# Patient Record
Sex: Male | Born: 1960 | Race: White | Hispanic: No | Marital: Married | Smoking: Never smoker
Health system: Southern US, Community
[De-identification: ages and names within clinical notes are randomized; demographics above are authoritative.]

## PROBLEM LIST (undated history)

## (undated) DIAGNOSIS — M1990 Unspecified osteoarthritis, unspecified site: Secondary | ICD-10-CM

## (undated) DIAGNOSIS — I779 Disorder of arteries and arterioles, unspecified: Secondary | ICD-10-CM

## (undated) DIAGNOSIS — K219 Gastro-esophageal reflux disease without esophagitis: Secondary | ICD-10-CM

## (undated) DIAGNOSIS — I10 Essential (primary) hypertension: Secondary | ICD-10-CM

## (undated) DIAGNOSIS — I219 Acute myocardial infarction, unspecified: Secondary | ICD-10-CM

## (undated) DIAGNOSIS — A6920 Lyme disease, unspecified: Secondary | ICD-10-CM

## (undated) DIAGNOSIS — I2510 Atherosclerotic heart disease of native coronary artery without angina pectoris: Secondary | ICD-10-CM

## (undated) DIAGNOSIS — F329 Major depressive disorder, single episode, unspecified: Secondary | ICD-10-CM

## (undated) DIAGNOSIS — I200 Unstable angina: Secondary | ICD-10-CM

## (undated) DIAGNOSIS — Z7902 Long term (current) use of antithrombotics/antiplatelets: Secondary | ICD-10-CM

## (undated) DIAGNOSIS — I6523 Occlusion and stenosis of bilateral carotid arteries: Secondary | ICD-10-CM

## (undated) DIAGNOSIS — F32A Depression, unspecified: Secondary | ICD-10-CM

## (undated) DIAGNOSIS — E785 Hyperlipidemia, unspecified: Secondary | ICD-10-CM

## (undated) DIAGNOSIS — F419 Anxiety disorder, unspecified: Secondary | ICD-10-CM

## (undated) DIAGNOSIS — H9319 Tinnitus, unspecified ear: Secondary | ICD-10-CM

## (undated) HISTORY — PX: PORTACATH PLACEMENT: SHX2246

## (undated) HISTORY — PX: PORT-A-CATH REMOVAL: SHX5289

## (undated) HISTORY — PX: CORONARY ANGIOPLASTY: SHX604

## (undated) HISTORY — PX: COLONOSCOPY: SHX174

---

## 2015-09-02 DIAGNOSIS — I214 Non-ST elevation (NSTEMI) myocardial infarction: Secondary | ICD-10-CM

## 2015-09-02 HISTORY — DX: Non-ST elevation (NSTEMI) myocardial infarction: I21.4

## 2016-04-13 ENCOUNTER — Emergency Department: Payer: BLUE CROSS/BLUE SHIELD

## 2016-04-13 ENCOUNTER — Encounter: Payer: Self-pay | Admitting: Emergency Medicine

## 2016-04-13 ENCOUNTER — Inpatient Hospital Stay
Admission: EM | Admit: 2016-04-13 | Discharge: 2016-04-15 | DRG: 247 | Disposition: A | Discharge status: Home / Self Care | Payer: BLUE CROSS/BLUE SHIELD | Attending: Internal Medicine | Admitting: Internal Medicine

## 2016-04-13 DIAGNOSIS — I2510 Atherosclerotic heart disease of native coronary artery without angina pectoris: Secondary | ICD-10-CM | POA: Diagnosis present

## 2016-04-13 DIAGNOSIS — I10 Essential (primary) hypertension: Secondary | ICD-10-CM | POA: Diagnosis present

## 2016-04-13 DIAGNOSIS — Z79899 Other long term (current) drug therapy: Secondary | ICD-10-CM

## 2016-04-13 DIAGNOSIS — Z808 Family history of malignant neoplasm of other organs or systems: Secondary | ICD-10-CM

## 2016-04-13 DIAGNOSIS — E785 Hyperlipidemia, unspecified: Secondary | ICD-10-CM | POA: Diagnosis present

## 2016-04-13 DIAGNOSIS — F419 Anxiety disorder, unspecified: Secondary | ICD-10-CM | POA: Diagnosis present

## 2016-04-13 DIAGNOSIS — R079 Chest pain, unspecified: Secondary | ICD-10-CM

## 2016-04-13 DIAGNOSIS — I214 Non-ST elevation (NSTEMI) myocardial infarction: Secondary | ICD-10-CM | POA: Diagnosis present

## 2016-04-13 DIAGNOSIS — I219 Acute myocardial infarction, unspecified: Secondary | ICD-10-CM

## 2016-04-13 HISTORY — DX: Anxiety disorder, unspecified: F41.9

## 2016-04-13 HISTORY — DX: Acute myocardial infarction, unspecified: I21.9

## 2016-04-13 HISTORY — DX: Lyme disease, unspecified: A69.20

## 2016-04-13 HISTORY — DX: Essential (primary) hypertension: I10

## 2016-04-13 LAB — COMPREHENSIVE METABOLIC PANEL
ALBUMIN: 4.2 g/dL (ref 3.5–5.0)
ALK PHOS: 49 U/L (ref 38–126)
ALT: 16 U/L — ABNORMAL LOW (ref 17–63)
AST: 18 U/L (ref 15–41)
Anion gap: 3 — ABNORMAL LOW (ref 5–15)
BILIRUBIN TOTAL: 0.5 mg/dL (ref 0.3–1.2)
BUN: 14 mg/dL (ref 6–20)
CO2: 30 mmol/L (ref 22–32)
Calcium: 8.9 mg/dL (ref 8.9–10.3)
Chloride: 107 mmol/L (ref 101–111)
Creatinine, Ser: 1.05 mg/dL (ref 0.61–1.24)
GFR calc Af Amer: >60 mL/min (ref >60)
GFR calc non Af Amer: >60 mL/min (ref >60)
GLUCOSE: 136 mg/dL — AB (ref 65–99)
POTASSIUM: 3.5 mmol/L (ref 3.5–5.1)
Sodium: 140 mmol/L (ref 135–145)
TOTAL PROTEIN: 6.7 g/dL (ref 6.5–8.1)

## 2016-04-13 LAB — TROPONIN I
TROPONIN I: 0.1 ng/mL — AB (ref <0.03)
Troponin I: <0.03 ng/mL (ref <0.03)

## 2016-04-13 LAB — CBC WITH DIFFERENTIAL/PLATELET
BASOS ABS: 0.1 10*3/uL (ref 0–0.1)
BASOS PCT: 1 %
Eosinophils Absolute: 0.2 10*3/uL (ref 0–0.7)
Eosinophils Relative: 3 %
HEMATOCRIT: 39.4 % — AB (ref 40.0–52.0)
HEMOGLOBIN: 13.7 g/dL (ref 13.0–18.0)
Lymphocytes Relative: 28 %
Lymphs Abs: 2.3 10*3/uL (ref 1.0–3.6)
MCH: 30.7 pg (ref 26.0–34.0)
MCHC: 34.7 g/dL (ref 32.0–36.0)
MCV: 88.5 fL (ref 80.0–100.0)
Monocytes Absolute: 0.4 10*3/uL (ref 0.2–1.0)
Monocytes Relative: 5 %
NEUTROS ABS: 5.2 10*3/uL (ref 1.4–6.5)
NEUTROS PCT: 63 %
Platelets: 194 10*3/uL (ref 150–440)
RBC: 4.45 MIL/uL (ref 4.40–5.90)
RDW: 13.9 % (ref 11.5–14.5)
WBC: 8.2 10*3/uL (ref 3.8–10.6)

## 2016-04-13 LAB — LIPASE, BLOOD: LIPASE: 20 U/L (ref 11–51)

## 2016-04-13 LAB — FIBRIN DERIVATIVES D-DIMER (ARMC ONLY): Fibrin derivatives D-dimer (ARMC): 256 (ref 0–499)

## 2016-04-13 MED ORDER — FENTANYL CITRATE (PF) 100 MCG/2ML IJ SOLN: 100.0000 ug | INTRAMUSCULAR | Status: DC | PRN

## 2016-04-13 NOTE — ED Notes (Signed)
Pt requesting something to eat. Given [place] sandwich tray and sprite.

## 2016-04-13 NOTE — ED Notes (Signed)
Dr Ara Kussmaul at bedside seeing pt.

## 2016-04-13 NOTE — H&P (Addendum)
[place] @ [facility] Admission History and Physical Harvie Bridge, D.O.  ---------------------------------------------------------------------------------------------------------------------   PATIENT NAME: Carlos Diaz MR#: [id number] DATE OF BIRTH: 1961/08/19 DATE OF ADMISSION: 04/13/2016 PRIMARY CARE PHYSICIAN: Valera Castle, MD  REQUESTING/REFERRING PHYSICIAN: ED Dr. Quentin Cornwall  CHIEF COMPLAINT: Chief Complaint  Patient presents with  . Chest Pain    HISTORY OF PRESENT ILLNESS: Carlos Diaz is a 55 y.o. male with a known history of Anxiety, hypertension and Lyme disease was in a usual state of health until this afternoon when he experienced chest pain. Patient states that for the last day he has had generalized fatigue. He was out to dinner this evening, ate half of a hamburger, and was walking into his home when he began experiencing 10 out of 10 mid sternal chest pain radiating to the posterior aspect of the left arm. He reports the pain as a deep ache, associated with diaphoresis but no nausea, shortness of breath, abdominal pain or palpitations. He states that he has never had this type of chest pain before. He states that when he was diagnosed with Lyme disease he underwent extensive testing but has not had any workup in the last 10 years.  Of note, today is the two-year anniversary of his son's death although he states that there has not been any anxiety or emotional reaction related to it.  Otherwise there has been no change in status. Patient has been taking medication as prescribed and there has been no recent change in medication or diet.  There has been no recent illness, travel or sick contacts.    Patient denies fevers/chills, weakness, dizziness, shortness of breath, N/V/C/D, abdominal pain, dysuria/frequency, changes in mental status.   EMS/ED COURSE:     PAST MEDICAL HISTORY: Past Medical History:  Diagnosis Date  . Anxiety   .  Hypertension   . Lyme disease       PAST SURGICAL HISTORY: History reviewed. No pertinent surgical history.    SOCIAL HISTORY: Social History  Substance Use Topics  . Smoking status: Never Smoker  . Smokeless tobacco: Never Used  . 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   FAMILY HISTORY: Mother died of brain cancer. Father is in his 26s and healthy. He has multiple uncles and cousins with coronary artery disease.   MEDICATIONS AT HOME: Prior to Admission medications   Medication Sig Start Date End Date Taking? Authorizing Provider  PARoxetine (PAXIL) 30 MG tablet Take 30 mg by mouth daily.   Yes Historical Provider, MD  verapamil (CALAN-SR) 180 MG CR tablet Take 180 mg by mouth 2 (two) times daily.   Yes Historical Provider, MD      DRUG ALLERGIES: No Known Allergies   REVIEW OF SYSTEMS: CONSTITUTIONAL: No fever/chills, fatigue, weakness, weight gain/loss, headache EYES: No blurry or double vision. ENT: No tinnitus, postnasal drip, redness or soreness of the oropharynx. RESPIRATORY: No cough, wheeze, hemoptysis, dyspnea. CARDIOVASCULAR: Positive chest pain, negative for orthopnea, palpitations, syncope. GASTROINTESTINAL: No nausea, vomiting, constipation, diarrhea, abdominal pain, hematemesis, melena or hematochezia. GENITOURINARY: No dysuria or hematuria. ENDOCRINE: No polyuria or nocturia. No heat or cold intolerance. HEMATOLOGY: No anemia, bruising, bleeding. INTEGUMENTARY: No rashes, ulcers, lesions. MUSCULOSKELETAL: No arthritis, swelling, gout. NEUROLOGIC: No numbness, tingling, weakness or ataxia. No seizure-type activity. PSYCHIATRIC: No anxiety, depression, insomnia.  PHYSICAL EXAMINATION: VITAL SIGNS: Blood pressure 122/73, pulse 60, temperature 98.2 F (36.8 C), temperature source Oral, resp. rate 12, height 6' (1.829 m), weight 108.9  kg (240 lb), SpO2 95 %.  GENERAL: 55 y.o.-year-old white male patient,  well-developed, well-nourished lying in the bed in no acute distress.  Pleasant and cooperative.   HEENT: Head atraumatic, normocephalic. Pupils equal, round, reactive to light and accommodation. No scleral icterus. Extraocular muscles intact. Nares are patent. Oropharynx is clear. Mucus membranes moist. NECK: Supple, full range of motion. No JVD, no bruit heard. No thyroid enlargement, no tenderness, no cervical lymphadenopathy. CHEST: Normal breath sounds bilaterally. No wheezing, rales, rhonchi or crackles. No use of accessory muscles of respiration.  No reproducible chest wall tenderness.  CARDIOVASCULAR: S1, S2 normal. No murmurs, rubs, or gallops. Cap refill <2 seconds. ABDOMEN: Soft, nontender, nondistended. No rebound, guarding, rigidity. Normoactive bowel sounds present in all four quadrants. No organomegaly or mass. EXTREMITIES: Full range of motion. No pedal edema, cyanosis, or clubbing. NEUROLOGIC: Cranial nerves II through XII are grossly intact with no focal sensorimotor deficit. Muscle strength 5/5 in all extremities. Sensation intact. Gait not checked. PSYCHIATRIC: The patient is alert and oriented x 3. Normal affect, mood, thought content. SKIN: Warm, dry, and intact without obvious rash, lesion, or ulcer.  LABORATORY PANEL:  CBC  Recent Labs Lab 04/13/16 1809  WBC 8.2  HGB 13.7  HCT 39.4*  PLT 194   ----------------------------------------------------------------------------------------------------------------- Chemistries  Recent Labs Lab 04/13/16 1809  NA 140  K 3.5  CL 107  CO2 30  GLUCOSE 136*  BUN 14  CREATININE 1.05  CALCIUM 8.9  AST 18  ALT 16*  ALKPHOS 38  BILITOT 0.5   ------------------------------------------------------------------------------------------------------------------ Cardiac Enzymes  Recent Labs Lab 04/13/16 2105  TROPONINI 0.10*    ------------------------------------------------------------------------------------------------------------------  RADIOLOGY: Dg Chest 2 View  Result Date: 04/13/2016 CLINICAL DATA:  Pt arrived by EMS from home with c/o anterior chest pain that was sharp and sudden moving from the left side to the right, also c/o pain in left upper arm, h/o htn, no other known heart or lung conditions; non smoker. EXAM: CHEST  2 VIEW COMPARISON:  None. FINDINGS: The heart size and mediastinal contours are within normal limits. Both lungs are clear. No pleural effusion. Mild lower thoracic spondylosis. IMPRESSION: 1.  No active cardiopulmonary disease is radiographically apparent. Electronically Signed   By: Van Clines M.D.   On: 04/13/2016 18:23    EKG:  Sinus rhythm at 65 bpm with no significant ST or T-wave changes.  IMPRESSION AND PLAN:  This is a 55 y.o. male with a history of hypertension, anxiety, Lyme disease now being admitted with: 1. Chest pain, positive troponin consistent with non-ST elevation MI- -Admit to telemetry for inpatient workup. Patient will receive aspirin, Plavix, beta blocker, statin and Lovenox full dose. We will trend troponins, check lipids, hemoglobin A1c and TSH. We'll request cardiology consultation for the morning, echocardiogram and carotid Dopplers. 2. Hypertension-continue verapamil 3. Anxiety continue Paxil   All the records are reviewed and case discussed with ED provider. Management plans discussed with the patient and who express understanding and agree with plan of care.  CODE STATUS: Full TOTAL TIME TAKING CARE OF THIS PATIENT: 60 minutes.   Carlos Diaz D.O. on 04/13/2016 at 10:51 PM Between 7am to 6pm - Pager - [phone] After 6pm go to [email or web address] - [employer] [facility] [place] [facility] Office [phone] CC: Primary care physician; Valera Castle, MD     Note: This dictation was prepared with Dragon  dictation along with smaller phrase technology. Any transcriptional errors that result from this process are unintentional.

## 2016-04-13 NOTE — ED Triage Notes (Signed)
Pt arrived by EMS from home with c/o chest pain that was sharp and sudden moving from the left side to the right. EMS gave 324 aspirin in route and 1 nitro spray. Pt states he is currently not having any pain.

## 2016-04-13 NOTE — ED Provider Notes (Signed)
[facility] Emergency Department Provider Note    First MD Initiated Contact with Patient 04/13/16 1801     (approximate)  I have reviewed the triage vital signs and the nursing notes.   HISTORY  Chief Complaint Chest Pain    HPI Carlos Diaz is a 56 y.o. male with no previous cardiac history presents with one day of generalized malaise followed by midsternal chest pain radiating to his left shoulder that started roughly 1 hour prior to arrival. Patient states the pain was 9 out of 10 in severity and a burning stabbing pain and sensation. Patient denies any nausea or vomiting. No diaphoresis. Patient did not syncopized. Does not have any pain with deep inspiration. No recent fevers and no cough. He has secondary degree relatives with heart disease but no first-degree or personal history of coronary disease.   Past Medical History:  Diagnosis Date  . Anxiety   . Hypertension   . Lyme disease     There are no active problems to display for this patient.   History reviewed. No pertinent surgical history.  Prior to Admission medications   Not on File    Allergies Review of patient's allergies indicates no known allergies.  History reviewed. No pertinent family history.  Social History Social History  Substance Use Topics  . Smoking status: Never Smoker  . Smokeless tobacco: Never Used  . Alcohol use No    Review of Systems Patient denies headaches, rhinorrhea, blurry vision, numbness, shortness of breath, chest pain, edema, cough, abdominal pain, nausea, vomiting, diarrhea, dysuria, fevers, rashes or hallucinations unless otherwise stated above in HPI. ____________________________________________   PHYSICAL EXAM:  VITAL SIGNS: Vitals:   04/13/16 1900 04/13/16 1930  BP: 131/82 133/73  Pulse: 68 64  Resp: 14 12  Temp:     Constitutional: Alert and oriented. Well appearing and in no acute distress. Eyes: Conjunctivae are normal.  PERRL. EOMI. Head: Atraumatic. Nose: No congestion/rhinnorhea. Mouth/Throat: Mucous membranes are moist.  Oropharynx non-erythematous. Neck: No stridor. Painless ROM. No cervical spine tenderness to palpation Hematological/Lymphatic/Immunilogical: No cervical lymphadenopathy. Cardiovascular: Normal rate, regular rhythm. Grossly normal heart sounds.  Good peripheral circulation. Respiratory: Normal respiratory effort.  No retractions. Lungs CTAB. Gastrointestinal: Soft and nontender. No distention. No abdominal bruits. No CVA tenderness. Genitourinary:  Musculoskeletal: No lower extremity tenderness nor edema.  No joint effusions. Neurologic:  Normal speech and language. No gross focal neurologic deficits are appreciated. No gait instability. Skin:  Skin is warm, dry and intact. No rash noted. Psychiatric: Mood and affect are normal. Speech and behavior are normal.  ____________________________________________   LABS (all labs ordered are listed, but only abnormal results are displayed)  Results for orders placed or performed during the hospital encounter of 04/13/16 (from the past 24 hour(s))  CBC with Differential/Platelet     Status: Abnormal   Collection Time: 04/13/16  6:09 PM  Result Value Ref Range   WBC 8.2 3.8 - 10.6 K/uL   RBC 4.45 4.40 - 5.90 MIL/uL   Hemoglobin 13.7 13.0 - 18.0 g/dL   HCT 39.4 (L) 40.0 - 52.0 %   MCV 88.5 80.0 - 100.0 fL   MCH 30.7 26.0 - 34.0 pg   MCHC 34.7 32.0 - 36.0 g/dL   RDW 13.9 11.5 - 14.5 %   Platelets 194 150 - 440 K/uL   Neutrophils Relative % 63 %   Neutro Abs 5.2 1.4 - 6.5 K/uL   Lymphocytes Relative 28 %   Lymphs Abs  2.3 1.0 - 3.6 K/uL   Monocytes Relative 5 %   Monocytes Absolute 0.4 0.2 - 1.0 K/uL   Eosinophils Relative 3 %   Eosinophils Absolute 0.2 0 - 0.7 K/uL   Basophils Relative 1 %   Basophils Absolute 0.1 0 - 0.1 K/uL  Comprehensive metabolic panel     Status: Abnormal   Collection Time: 04/13/16  6:09 PM  Result Value  Ref Range   Sodium 140 135 - 145 mmol/L   Potassium 3.5 3.5 - 5.1 mmol/L   Chloride 107 101 - 111 mmol/L   CO2 30 22 - 32 mmol/L   Glucose, Bld 136 (H) 65 - 99 mg/dL   BUN 14 6 - 20 mg/dL   Creatinine, Ser 1.05 0.61 - 1.24 mg/dL   Calcium 8.9 8.9 - 10.3 mg/dL   Total Protein 6.7 6.5 - 8.1 g/dL   Albumin 4.2 3.5 - 5.0 g/dL   AST 18 15 - 41 U/L   ALT 16 (L) 17 - 63 U/L   Alkaline Phosphatase 49 38 - 126 U/L   Total Bilirubin 0.5 0.3 - 1.2 mg/dL   GFR calc non Af Amer >60 >60 mL/min   GFR calc Af Amer >60 >60 mL/min   Anion gap 3 (L) 5 - 15  Troponin I     Status: None   Collection Time: 04/13/16  6:09 PM  Result Value Ref Range   Troponin I <0.03 <0.03 ng/mL  Fibrin derivatives D-Dimer ([facility] only)     Status: None   Collection Time: 04/13/16  6:09 PM  Result Value Ref Range   Fibrin derivatives D-dimer ([facility]) 256 0 - 499  Lipase, blood     Status: None   Collection Time: 04/13/16  6:09 PM  Result Value Ref Range   Lipase 20 11 - 51 U/L   ____________________________________________  EKG My interpretation at Time: 18:04   Indication: chest pain  Rate: 65  Rhythm: nsr Axis: normal Other: poor r wave progression, no acute ST elevations or depressions ____________________________________________  RADIOLOGY  CXR  my read shows no evidence of acute cardiopulmonary process.  ____________________________________________   PROCEDURES  Procedure(s) performed: none    Critical Care performed: no ____________________________________________   INITIAL IMPRESSION / ASSESSMENT AND PLAN / ED COURSE  Pertinent labs & imaging results that were available during my care of the patient were reviewed by me and considered in my medical decision making (see chart for details).  DDX: Pneumonia, ACS, bronchitis, muscle skeletal pain, PE, dissection, esophageal spasm, pancreatitis  Carlos Diaz is a 55 y.o. who presents to the ED with chest pain that happened 1 hour prior to  arrival. Patient arrives afebrile and hemodynamically stable. He is currently chest pain-free. Presentation is not consistent with dissection as he has no persistent discomfort, tachycardia or pain radiating through to his back. He has equal pulses in bilateral upper extremities. His initial EKG is without evidence of acute ischemia. Patient is otherwise low risk heart score of 3 given only for history of high blood pressure. Patient is low-risk [place], will check a d-dimer due to concern for PE. Order chest x-ray to evaluate for pneumonia or bronchitis. His abdominal exam is soft and benign. Will check a lipase for any signs of pancreatitis.  Clinical Course  Comment By Time  Repeat troponin came back elevated at 0.1. Based on these findings Have recommended inpatient admission to patient  for further evaluation and management.  Spoke with Dr. Ara Kussmaul regarding the patient's  symptoms and presentation and she is currently agreed to admit patient for further management.  Merlyn Lot, MD 08/13 2200     ____________________________________________   FINAL CLINICAL IMPRESSION(S) / ED DIAGNOSES  Final diagnoses:  NSTEMI (non-ST elevated myocardial infarction) ([place])  Chest pain, unspecified chest pain type      NEW MEDICATIONS STARTED DURING THIS VISIT:  New Prescriptions   No medications on file     Note:  This document was prepared using Dragon voice recognition software and may include unintentional dictation errors.    Merlyn Lot, MD 04/13/16 2352

## 2016-04-14 ENCOUNTER — Inpatient Hospital Stay: Admit: 2016-04-14 | Payer: BLUE CROSS/BLUE SHIELD

## 2016-04-14 ENCOUNTER — Encounter
Admission: EM | Disposition: A | Discharge status: Home / Self Care | Payer: Self-pay | Source: Home / Self Care | Attending: Internal Medicine

## 2016-04-14 ENCOUNTER — Inpatient Hospital Stay
Admit: 2016-04-14 | Discharge: 2016-04-14 | Disposition: A | Discharge status: Home / Self Care | Payer: BLUE CROSS/BLUE SHIELD | Attending: Family Medicine | Admitting: Family Medicine

## 2016-04-14 DIAGNOSIS — I2510 Atherosclerotic heart disease of native coronary artery without angina pectoris: Secondary | ICD-10-CM

## 2016-04-14 DIAGNOSIS — I5189 Other ill-defined heart diseases: Secondary | ICD-10-CM

## 2016-04-14 HISTORY — PX: CARDIAC CATHETERIZATION: SHX172

## 2016-04-14 HISTORY — DX: Other ill-defined heart diseases: I51.89

## 2016-04-14 HISTORY — DX: Atherosclerotic heart disease of native coronary artery without angina pectoris: I25.10

## 2016-04-14 LAB — TROPONIN I
TROPONIN I: 0.13 ng/mL — AB (ref <0.03)
TROPONIN I: 0.7 ng/mL — AB (ref <0.03)
Troponin I: 0.35 ng/mL (ref <0.03)

## 2016-04-14 LAB — LIPID PANEL
CHOL/HDL RATIO: 5.5 ratio
Cholesterol: 192 mg/dL (ref 0–200)
HDL: 35 mg/dL — ABNORMAL LOW (ref >40)
LDL CALC: 126 mg/dL — AB (ref 0–99)
Triglycerides: 154 mg/dL — ABNORMAL HIGH (ref <150)
VLDL: 31 mg/dL (ref 0–40)

## 2016-04-14 LAB — CBC
HCT: 40.3 % (ref 40.0–52.0)
Hemoglobin: 14 g/dL (ref 13.0–18.0)
MCH: 30.3 pg (ref 26.0–34.0)
MCHC: 34.7 g/dL (ref 32.0–36.0)
MCV: 87.5 fL (ref 80.0–100.0)
PLATELETS: 200 10*3/uL (ref 150–440)
RBC: 4.61 MIL/uL (ref 4.40–5.90)
RDW: 14.2 % (ref 11.5–14.5)
WBC: 9.7 10*3/uL (ref 3.8–10.6)

## 2016-04-14 LAB — BASIC METABOLIC PANEL
Anion gap: 8 (ref 5–15)
BUN: 11 mg/dL (ref 6–20)
CO2: 24 mmol/L (ref 22–32)
Calcium: 8.7 mg/dL — ABNORMAL LOW (ref 8.9–10.3)
Chloride: 107 mmol/L (ref 101–111)
Creatinine, Ser: 0.82 mg/dL (ref 0.61–1.24)
GFR calc Af Amer: >60 mL/min (ref >60)
GLUCOSE: 98 mg/dL (ref 65–99)
POTASSIUM: 3.8 mmol/L (ref 3.5–5.1)
Sodium: 139 mmol/L (ref 135–145)

## 2016-04-14 LAB — TSH: TSH: 3.362 u[IU]/mL (ref 0.350–4.500)

## 2016-04-14 LAB — HEMOGLOBIN A1C: HEMOGLOBIN A1C: 5.2 % (ref 4.0–6.0)

## 2016-04-14 LAB — PROTIME-INR
INR: 1.06
PROTHROMBIN TIME: 13.8 s (ref 11.4–15.2)

## 2016-04-14 SURGERY — LEFT HEART CATH AND CORONARY ANGIOGRAPHY
Anesthesia: Moderate Sedation | Laterality: Right

## 2016-04-14 MED ORDER — TICAGRELOR 90 MG PO TABS
90.0000 mg | ORAL_TABLET | Freq: Two times a day (BID) | ORAL | Status: DC
  Administered 2016-04-14 – 2016-04-15 (×2): 90 mg via ORAL
  Filled 2016-04-14 (×2): qty 1

## 2016-04-14 MED ORDER — BIVALIRUDIN 250 MG IV SOLR
INTRAVENOUS | Status: AC
  Filled 2016-04-14: qty 250

## 2016-04-14 MED ORDER — VERAPAMIL HCL ER 180 MG PO TBCR
180.0000 mg | EXTENDED_RELEASE_TABLET | Freq: Two times a day (BID) | ORAL | Status: DC
  Administered 2016-04-14 – 2016-04-15 (×4): 180 mg via ORAL
  Filled 2016-04-14 (×4): qty 1

## 2016-04-14 MED ORDER — ONDANSETRON HCL 4 MG/2ML IJ SOLN: 4.0000 mg | Freq: Four times a day (QID) | INTRAMUSCULAR | Status: DC | PRN

## 2016-04-14 MED ORDER — ENOXAPARIN SODIUM 120 MG/0.8ML ~~LOC~~ SOLN
1.0000 mg/kg | Freq: Two times a day (BID) | SUBCUTANEOUS | Status: DC
  Administered 2016-04-14: 120 mg via SUBCUTANEOUS
  Administered 2016-04-15: 110 mg via SUBCUTANEOUS
  Filled 2016-04-14 (×4): qty 0.8

## 2016-04-14 MED ORDER — TICAGRELOR 90 MG PO TABS
ORAL_TABLET | ORAL | Status: DC | PRN
  Administered 2016-04-14: 180 mg via ORAL

## 2016-04-14 MED ORDER — PAROXETINE HCL 20 MG PO TABS
30.0000 mg | ORAL_TABLET | Freq: Every day | ORAL | Status: DC
  Administered 2016-04-14 – 2016-04-15 (×2): 30 mg via ORAL
  Filled 2016-04-14 (×2): qty 2

## 2016-04-14 MED ORDER — SODIUM CHLORIDE 0.9 % IV SOLN: 250.0000 mL | INTRAVENOUS | Status: DC | PRN

## 2016-04-14 MED ORDER — HEPARIN (PORCINE) IN NACL 2-0.9 UNIT/ML-% IJ SOLN
INTRAMUSCULAR | Status: AC
  Filled 2016-04-14: qty 500

## 2016-04-14 MED ORDER — NITROGLYCERIN 5 MG/ML IV SOLN
INTRAVENOUS | Status: AC
  Filled 2016-04-14: qty 10

## 2016-04-14 MED ORDER — ASPIRIN 81 MG PO CHEW
CHEWABLE_TABLET | ORAL | Status: DC | PRN
  Administered 2016-04-14: 243 mg via ORAL

## 2016-04-14 MED ORDER — FENTANYL CITRATE (PF) 100 MCG/2ML IJ SOLN
INTRAMUSCULAR | Status: DC | PRN
  Administered 2016-04-14: 25 ug via INTRAVENOUS
  Administered 2016-04-14: 50 ug via INTRAVENOUS

## 2016-04-14 MED ORDER — ACETAMINOPHEN 325 MG PO TABS: 650.0000 mg | ORAL_TABLET | ORAL | Status: DC | PRN

## 2016-04-14 MED ORDER — MIDAZOLAM HCL 2 MG/2ML IJ SOLN
INTRAMUSCULAR | Status: AC
  Filled 2016-04-14: qty 2

## 2016-04-14 MED ORDER — SODIUM CHLORIDE 0.9 % WEIGHT BASED INFUSION
3.0000 mL/kg/h | INTRAVENOUS | Status: DC
Start: 2016-04-15 — End: 2016-04-14
  Administered 2016-04-14: 3 mL/kg/h via INTRAVENOUS

## 2016-04-14 MED ORDER — ATORVASTATIN CALCIUM 20 MG PO TABS: 80.0000 mg | ORAL_TABLET | Freq: Every day | ORAL | Status: DC

## 2016-04-14 MED ORDER — SODIUM CHLORIDE 0.9 % WEIGHT BASED INFUSION: 1.0000 mL/kg/h | INTRAVENOUS | Status: DC

## 2016-04-14 MED ORDER — MIDAZOLAM HCL 2 MG/2ML IJ SOLN
INTRAMUSCULAR | Status: DC | PRN
  Administered 2016-04-14 (×2): 1 mg via INTRAVENOUS

## 2016-04-14 MED ORDER — ZOLPIDEM TARTRATE 5 MG PO TABS: 5.0000 mg | ORAL_TABLET | Freq: Every evening | ORAL | Status: DC | PRN

## 2016-04-14 MED ORDER — ALPRAZOLAM 0.25 MG PO TABS: 0.2500 mg | ORAL_TABLET | Freq: Two times a day (BID) | ORAL | Status: DC | PRN

## 2016-04-14 MED ORDER — SODIUM CHLORIDE 0.9% FLUSH
3.0000 mL | Freq: Two times a day (BID) | INTRAVENOUS | Status: DC
  Administered 2016-04-14: 3 mL via INTRAVENOUS

## 2016-04-14 MED ORDER — SODIUM CHLORIDE 0.9% FLUSH
3.0000 mL | Freq: Two times a day (BID) | INTRAVENOUS | Status: DC
  Administered 2016-04-14 – 2016-04-15 (×2): 3 mL via INTRAVENOUS

## 2016-04-14 MED ORDER — ASPIRIN 300 MG RE SUPP: 300.0000 mg | RECTAL | Status: AC

## 2016-04-14 MED ORDER — NITROGLYCERIN 0.4 MG SL SUBL: 0.4000 mg | SUBLINGUAL_TABLET | SUBLINGUAL | Status: DC | PRN

## 2016-04-14 MED ORDER — TICAGRELOR 90 MG PO TABS
ORAL_TABLET | ORAL | Status: AC
  Filled 2016-04-14: qty 2

## 2016-04-14 MED ORDER — ASPIRIN 81 MG PO CHEW
CHEWABLE_TABLET | ORAL | Status: AC
  Filled 2016-04-14: qty 4

## 2016-04-14 MED ORDER — SODIUM CHLORIDE 0.9% FLUSH: 3.0000 mL | INTRAVENOUS | Status: DC | PRN

## 2016-04-14 MED ORDER — METOPROLOL TARTRATE 5 MG/5ML IV SOLN: 5.0000 mg | Freq: Once | INTRAVENOUS | Status: DC

## 2016-04-14 MED ORDER — ASPIRIN 81 MG PO CHEW: 324.0000 mg | CHEWABLE_TABLET | ORAL | Status: AC

## 2016-04-14 MED ORDER — SODIUM CHLORIDE 0.9 % IV SOLN
INTRAVENOUS | Status: DC | PRN
  Administered 2016-04-14: 1.75 mg/kg/h via INTRAVENOUS

## 2016-04-14 MED ORDER — CLOPIDOGREL BISULFATE 75 MG PO TABS
300.0000 mg | ORAL_TABLET | Freq: Once | ORAL | Status: AC
  Administered 2016-04-14: 300 mg via ORAL
  Filled 2016-04-14: qty 4

## 2016-04-14 MED ORDER — ASPIRIN EC 81 MG PO TBEC
81.0000 mg | DELAYED_RELEASE_TABLET | Freq: Every day | ORAL | Status: DC
  Administered 2016-04-14: 81 mg via ORAL
  Filled 2016-04-14: qty 1

## 2016-04-14 MED ORDER — NITROGLYCERIN 1 MG/10 ML FOR IR/CATH LAB
INTRA_ARTERIAL | Status: DC | PRN
  Administered 2016-04-14 (×4): 200 ug via INTRACORONARY

## 2016-04-14 MED ORDER — FENTANYL CITRATE (PF) 100 MCG/2ML IJ SOLN
INTRAMUSCULAR | Status: AC
  Filled 2016-04-14: qty 2

## 2016-04-14 MED ORDER — IOPAMIDOL (ISOVUE-300) INJECTION 61%
INTRAVENOUS | Status: DC | PRN
Start: 2016-04-14 — End: 2016-04-14
  Administered 2016-04-14: 165 mL via INTRA_ARTERIAL
  Administered 2016-04-14: 235 mL via INTRA_ARTERIAL

## 2016-04-14 MED ORDER — ATORVASTATIN CALCIUM 20 MG PO TABS
80.0000 mg | ORAL_TABLET | Freq: Once | ORAL | Status: AC
  Administered 2016-04-14: 80 mg via ORAL
  Filled 2016-04-14: qty 4

## 2016-04-14 MED ORDER — ASPIRIN 81 MG PO CHEW
81.0000 mg | CHEWABLE_TABLET | Freq: Every day | ORAL | Status: DC
  Administered 2016-04-15: 81 mg via ORAL
  Filled 2016-04-14: qty 1

## 2016-04-14 MED ORDER — METOPROLOL TARTRATE 25 MG PO TABS
12.5000 mg | ORAL_TABLET | Freq: Two times a day (BID) | ORAL | Status: DC
  Administered 2016-04-14 (×2): 12.5 mg via ORAL
  Administered 2016-04-14: 25 mg via ORAL
  Administered 2016-04-15: 12.5 mg via ORAL
  Filled 2016-04-14 (×4): qty 1

## 2016-04-14 MED ORDER — BIVALIRUDIN BOLUS VIA INFUSION - CUPID
INTRAVENOUS | Status: DC | PRN
  Administered 2016-04-14: 81.6 mg via INTRAVENOUS

## 2016-04-14 MED ORDER — SODIUM CHLORIDE 0.9 % IV SOLN
0.2500 mg/kg/h | INTRAVENOUS | Status: AC
  Filled 2016-04-14: qty 250

## 2016-04-14 MED ORDER — SODIUM CHLORIDE 0.9 % WEIGHT BASED INFUSION: 3.0000 mL/kg/h | INTRAVENOUS | Status: AC

## 2016-04-14 MED ORDER — ASPIRIN 81 MG PO CHEW: 81.0000 mg | CHEWABLE_TABLET | ORAL | Status: DC

## 2016-04-14 SURGICAL SUPPLY — 16 items
BALLN TREK RX 2.25X15 (BALLOONS) ×3
BALLOON TREK RX 2.25X15 (BALLOONS) ×2 IMPLANT
CATH INFINITI 5FR ANG PIGTAIL (CATHETERS) ×3 IMPLANT
CATH INFINITI 5FR JL4 (CATHETERS) ×3 IMPLANT
CATH INFINITI JR4 5F (CATHETERS) ×3 IMPLANT
CATH VISTA GUIDE 6FR XB3.5 SH (CATHETERS) ×3 IMPLANT
DEVICE CLOSURE MYNXGRIP 6/7F (Vascular Products) ×3 IMPLANT
DEVICE INFLAT 30 PLUS (MISCELLANEOUS) ×3 IMPLANT
KIT MANI 3VAL PERCEP (MISCELLANEOUS) ×3 IMPLANT
NEEDLE PERC 18GX7CM (NEEDLE) ×3 IMPLANT
PACK CARDIAC CATH (CUSTOM PROCEDURE TRAY) ×3 IMPLANT
SHEATH AVANTI 6FR X 11CM (SHEATH) ×3 IMPLANT
SHEATH PINNACLE 5F 10CM (SHEATH) ×3 IMPLANT
STENT XIENCE ALPINE RX 2.25X15 (Permanent Stent) ×3 IMPLANT
WIRE EMERALD 3MM-J .035X150CM (WIRE) ×3 IMPLANT
WIRE G HI TQ BMW 190 (WIRE) ×3 IMPLANT

## 2016-04-14 NOTE — H&P (Signed)
Carlos Diaz is a 55 y.o. male  [id number]  Primary Cardiologist: Neoma Laming Reason for Consultation: Non-STEMI  HPI: This is a 55 year old white male with a past medical history of anxiety disorder hypertension Lyme disease presented to the hospital with chest pain yesterday. Chest pain was described as pressure type associated with shortness of breath and diaphoresis. He had recurrent chest pain this morning also.   Review of Systems: No orthopnea PND or leg swelling    Past Medical History:  Diagnosis Date  . Anxiety   . Hypertension   . Lyme disease     Medications Prior to Admission  Medication Sig Dispense Refill  . PARoxetine (PAXIL) 30 MG tablet Take 30 mg by mouth daily.    . verapamil (CALAN-SR) 180 MG CR tablet Take 180 mg by mouth 2 (two) times daily.       Marland Kitchen aspirin  324 mg Oral NOW   Or  . aspirin  300 mg Rectal NOW  . aspirin EC  81 mg Oral Daily  . enoxaparin (LOVENOX) injection  1 mg/kg Subcutaneous Q12H  . metoprolol  5 mg Intravenous Once  . metoprolol tartrate  12.5 mg Oral BID  . PARoxetine  30 mg Oral Daily  . verapamil  180 mg Oral BID    Infusions:    No Known Allergies  Social History   Social History  . Marital status: Married    Spouse name: N/A  . Number of children: N/A  . Years of education: N/A   Occupational History  . Not on file.   Social History Main Topics  . Smoking status: Never Smoker  . Smokeless tobacco: Never Used  . Alcohol use No  . Drug use: No  . Sexual activity: Not on file   Other Topics Concern  . Not on file   Social History Narrative  . No narrative on file    History reviewed. No pertinent family history.  PHYSICAL EXAM: Vitals:   04/14/16 0015 04/14/16 0503  BP: 135/74 133/76  Pulse: 64 (!) 55  Resp: 16 16  Temp: 99 F (37.2 C) 98.1 F (36.7 C)     Intake/Output Summary (Last 24 hours) at 04/14/16 0837 Last data filed at 04/14/16 0503  Gross per 24 hour  Intake                 0 ml  Output              500 ml  Net             -500 ml    General:  Well appearing. No respiratory difficulty HEENT: normal Neck: supple. no JVD. Carotids 2+ bilat; no bruits. No lymphadenopathy or thryomegaly appreciated. Cor: PMI nondisplaced. Regular rate & rhythm. No rubs, gallops or murmurs. Lungs: clear Abdomen: soft, nontender, nondistended. No hepatosplenomegaly. No bruits or masses. Good bowel sounds. Extremities: no cyanosis, clubbing, rash, edema Neuro: alert & oriented x 3, cranial nerves grossly intact. moves all 4 extremities w/o difficulty. Affect pleasant.  ECG: Normal sinus rhythm nonspecific ST-T changes  Results for orders placed or performed during the hospital encounter of 04/13/16 (from the past 24 hour(s))  CBC with Differential/Platelet     Status: Abnormal   Collection Time: 04/13/16  6:09 PM  Result Value Ref Range   WBC 8.2 3.8 - 10.6 K/uL   RBC 4.45 4.40 - 5.90 MIL/uL   Hemoglobin 13.7 13.0 - 18.0 g/dL   HCT 39.4 (L)  40.0 - 52.0 %   MCV 88.5 80.0 - 100.0 fL   MCH 30.7 26.0 - 34.0 pg   MCHC 34.7 32.0 - 36.0 g/dL   RDW 13.9 11.5 - 14.5 %   Platelets 194 150 - 440 K/uL   Neutrophils Relative % 63 %   Neutro Abs 5.2 1.4 - 6.5 K/uL   Lymphocytes Relative 28 %   Lymphs Abs 2.3 1.0 - 3.6 K/uL   Monocytes Relative 5 %   Monocytes Absolute 0.4 0.2 - 1.0 K/uL   Eosinophils Relative 3 %   Eosinophils Absolute 0.2 0 - 0.7 K/uL   Basophils Relative 1 %   Basophils Absolute 0.1 0 - 0.1 K/uL  Comprehensive metabolic panel     Status: Abnormal   Collection Time: 04/13/16  6:09 PM  Result Value Ref Range   Sodium 140 135 - 145 mmol/L   Potassium 3.5 3.5 - 5.1 mmol/L   Chloride 107 101 - 111 mmol/L   CO2 30 22 - 32 mmol/L   Glucose, Bld 136 (H) 65 - 99 mg/dL   BUN 14 6 - 20 mg/dL   Creatinine, Ser 1.05 0.61 - 1.24 mg/dL   Calcium 8.9 8.9 - 10.3 mg/dL   Total Protein 6.7 6.5 - 8.1 g/dL   Albumin 4.2 3.5 - 5.0 g/dL   AST 18 15 - 41 U/L   ALT 16 (L) 17  - 63 U/L   Alkaline Phosphatase 49 38 - 126 U/L   Total Bilirubin 0.5 0.3 - 1.2 mg/dL   GFR calc non Af Amer >60 >60 mL/min   GFR calc Af Amer >60 >60 mL/min   Anion gap 3 (L) 5 - 15  Troponin I     Status: None   Collection Time: 04/13/16  6:09 PM  Result Value Ref Range   Troponin I <0.03 <0.03 ng/mL  Fibrin derivatives D-Dimer ([facility] only)     Status: None   Collection Time: 04/13/16  6:09 PM  Result Value Ref Range   Fibrin derivatives D-dimer [facility]) 256 0 - 499  Lipase, blood     Status: None   Collection Time: 04/13/16  6:09 PM  Result Value Ref Range   Lipase 20 11 - 51 U/L  Troponin I     Status: Abnormal   Collection Time: 04/13/16  9:05 PM  Result Value Ref Range   Troponin I 0.10 (HH) <0.03 ng/mL  TSH     Status: None   Collection Time: 04/14/16 12:31 AM  Result Value Ref Range   TSH 3.362 0.350 - 4.500 uIU/mL  Troponin I     Status: Abnormal   Collection Time: 04/14/16 12:31 AM  Result Value Ref Range   Troponin I 0.13 (HH) <0.03 ng/mL  Troponin I     Status: Abnormal   Collection Time: 04/14/16  6:23 AM  Result Value Ref Range   Troponin I 0.35 (HH) <0.03 ng/mL  Basic metabolic panel     Status: Abnormal   Collection Time: 04/14/16  6:23 AM  Result Value Ref Range   Sodium 139 135 - 145 mmol/L   Potassium 3.8 3.5 - 5.1 mmol/L   Chloride 107 101 - 111 mmol/L   CO2 24 22 - 32 mmol/L   Glucose, Bld 98 65 - 99 mg/dL   BUN 11 6 - 20 mg/dL   Creatinine, Ser 0.82 0.61 - 1.24 mg/dL   Calcium 8.7 (L) 8.9 - 10.3 mg/dL   GFR calc non Af Amer >60 >  60 mL/min   GFR calc Af Amer >60 >60 mL/min   Anion gap 8 5 - 15  Lipid panel     Status: Abnormal   Collection Time: 04/14/16  6:23 AM  Result Value Ref Range   Cholesterol 192 0 - 200 mg/dL   Triglycerides 154 (H) <150 mg/dL   HDL 35 (L) >40 mg/dL   Total CHOL/HDL Ratio 5.5 RATIO   VLDL 31 0 - 40 mg/dL   LDL Cholesterol 126 (H) 0 - 99 mg/dL  CBC     Status: None   Collection Time: 04/14/16  6:23 AM  Result Value  Ref Range   WBC 9.7 3.8 - 10.6 K/uL   RBC 4.61 4.40 - 5.90 MIL/uL   Hemoglobin 14.0 13.0 - 18.0 g/dL   HCT 40.3 40.0 - 52.0 %   MCV 87.5 80.0 - 100.0 fL   MCH 30.3 26.0 - 34.0 pg   MCHC 34.7 32.0 - 36.0 g/dL   RDW 14.2 11.5 - 14.5 %   Platelets 200 150 - 440 K/uL  Protime-INR     Status: None   Collection Time: 04/14/16  6:23 AM  Result Value Ref Range   Prothrombin Time 13.8 11.4 - 15.2 seconds   INR 1.06    Dg Chest 2 View  Result Date: 04/13/2016 CLINICAL DATA:  Pt arrived by EMS from home with c/o anterior chest pain that was sharp and sudden moving from the left side to the right, also c/o pain in left upper arm, h/o htn, no other known heart or lung conditions; non smoker. EXAM: CHEST  2 VIEW COMPARISON:  None. FINDINGS: The heart size and mediastinal contours are within normal limits. Both lungs are clear. No pleural effusion. Mild lower thoracic spondylosis. IMPRESSION: 1.  No active cardiopulmonary disease is radiographically apparent. Electronically Signed   By: Van Clines M.D.   On: 04/13/2016 18:23     ASSESSMENT AND PLAN:Non-STEMI with ongoing chest pain, plan doing cardiac catheterization today. Continue current medicines.  Jair Lindblad A

## 2016-04-14 NOTE — Plan of Care (Signed)
Problem: Consults Goal: Tobacco Cessation referral if indicated Outcome: Not Applicable Date Met: 39/76/73 Pt. denied smoking  Problem: Phase I Progression Outcomes Goal: Anginal pain relieved Outcome: Progressing Pt. denied any chest pain at this time

## 2016-04-14 NOTE — Progress Notes (Signed)
Patient is admitted to room 245 with the diagnosis of NSTEMI. Was transferred from the ER via stretcher and was accompanied by  Fritzi Mandes.  Alert and oriented x 4. Denied any acute  Pain at this time. Tele box called to CCMD with Estill Bamberg NT a second verifier.  Skin assessment done with Gelene Mink. RN, no skin issues of concern noted. No SOB or any acute distress noted. Fall Neurosurgeon. Accepted his medications without any complaint. Will continue to monitor.

## 2016-04-14 NOTE — Progress Notes (Signed)
ANTICOAGULATION CONSULT NOTE - Initial Consult  Pharmacy Consult for Lovenox dosing Indication: chest pain/ACS/STEMI  No Known Allergies  Patient Measurements: Height: 6' (182.9 cm) Weight: 240 lb (108.9 kg) IBW/kg (Calculated) : 77.6 Heparin Dosing Weight: 109 kg  Vital Signs: Temp: 98.2 F (36.8 C) (08/13 1808) Temp Source: Oral (08/13 1808) BP: 125/73 (08/13 2330) Pulse Rate: 60 (08/13 2330)  Labs:  Recent Labs  04/13/16 1809 04/13/16 2105  HGB 13.7  --   HCT 39.4*  --   PLT 194  --   CREATININE 1.05  --   TROPONINI <0.03 0.10*    Estimated Creatinine Clearance: 98.9 mL/min (by C-G formula based on SCr of 1.05 mg/dL).   Medical History: Past Medical History:  Diagnosis Date  . Anxiety   . Hypertension   . Lyme disease     Medications:    Assessment:  Goal of Therapy:   Monitor platelets by anticoagulation protocol: Yes   Plan:  Lovenox 1 mg/kg q 12 hours ordered. F/u labs per protocol.  Emran Molzahn S 04/14/2016,12:14 AM

## 2016-04-14 NOTE — Progress Notes (Signed)
[facility] [place] NAME: Gianny Rupe    MR#:  [id number]  DATE OF BIRTH:  03/03/1961  SUBJECTIVE:   Came in after having chest discomfort and found to have elevated troponin REVIEW OF SYSTEMS:   Review of Systems  Constitutional: Negative for chills, fever and weight loss.  HENT: Negative for ear discharge, ear pain and nosebleeds.   Eyes: Negative for blurred vision, pain and discharge.  Respiratory: Negative for sputum production, shortness of breath, wheezing and stridor.   Cardiovascular: Positive for chest pain. Negative for palpitations, orthopnea and PND.  Gastrointestinal: Negative for abdominal pain, diarrhea, nausea and vomiting.  Genitourinary: Negative for frequency and urgency.  Musculoskeletal: Negative for back pain and joint pain.  Neurological: Negative for sensory change, speech change, focal weakness and weakness.  Psychiatric/Behavioral: Negative for depression and hallucinations. The patient is not nervous/anxious.   All other systems reviewed and are negative.  Tolerating Diet: Tolerating PT:   DRUG ALLERGIES:  No Known Allergies  VITALS:  Blood pressure 132/86, pulse 60, temperature 98.3 F (36.8 C), resp. rate 20, height 6' (1.829 m), weight 239 lb 14.4 oz (108.8 kg), SpO2 98 %.  PHYSICAL EXAMINATION:   Physical Exam  GENERAL:  55 y.o.-year-old patient lying in the bed with no acute distress.  EYES: Pupils equal, round, reactive to light and accommodation. No scleral icterus. Extraocular muscles intact.  HEENT: Head atraumatic, normocephalic. Oropharynx and nasopharynx clear.  NECK:  Supple, no jugular venous distention. No thyroid enlargement, no tenderness.  LUNGS: Normal breath sounds bilaterally, no wheezing, rales, rhonchi. No use of accessory muscles of respiration.  CARDIOVASCULAR: S1, S2 normal. No murmurs, rubs, or gallops.  ABDOMEN: Soft, nontender, nondistended. Bowel sounds present. No  organomegaly or mass.  EXTREMITIES: No cyanosis, clubbing or edema b/l.    NEUROLOGIC: Cranial nerves II through XII are intact. No focal Motor or sensory deficits b/l.   PSYCHIATRIC:  patient is alert and oriented x 3.  SKIN: No obvious rash, lesion, or ulcer.   LABORATORY PANEL:  CBC  Recent Labs Lab 04/14/16 0623  WBC 9.7  HGB 14.0  HCT 40.3  PLT 200    Chemistries   Recent Labs Lab 04/13/16 1809 04/14/16 0623  NA 140 139  K 3.5 3.8  CL 107 107  CO2 30 24  GLUCOSE 136* 98  BUN 14 11  CREATININE 1.05 0.82  CALCIUM 8.9 8.7*  AST 18  --   ALT 16*  --   ALKPHOS 49  --   BILITOT 0.5  --    Cardiac Enzymes  Recent Labs Lab 04/14/16 0623  TROPONINI 0.35*   RADIOLOGY:  Dg Chest 2 View  Result Date: 04/13/2016 CLINICAL DATA:  Pt arrived by EMS from home with c/o anterior chest pain that was sharp and sudden moving from the left side to the right, also c/o pain in left upper arm, h/o htn, no other known heart or lung conditions; non smoker. EXAM: CHEST  2 VIEW COMPARISON:  None. FINDINGS: The heart size and mediastinal contours are within normal limits. Both lungs are clear. No pleural effusion. Mild lower thoracic spondylosis. IMPRESSION: 1.  No active cardiopulmonary disease is radiographically apparent. Electronically Signed   By: Van Clines M.D.   On: 04/13/2016 18:23   ASSESSMENT AND PLAN:  55 y.o. male with a history of hypertension, anxiety, Lyme disease now being admitted with: 1. ACute non-ST elevation MI --cont  aspirin, Plavix, beta blocker,  statin and Lovenox full dose.  -mild increase in the trend of troponins -seen by dr Humphrey Rolls. Scheduled for heart cath  2. Hypertension-continue verapamil  3. Anxiety continue Paxil  4. HL, new -cont statins  Case discussed with Care Management/Social Worker. Management plans discussed with the patient, family and they are in agreement.  CODE STATUS: full  DVT Prophylaxis: lovenox  TOTAL TIME TAKING  CARE OF THIS PATIENT: 30 minutes.  >50% time spent on counselling and coordination of care pt and wfie  POSSIBLE D/C IN 1-2 DAYS, DEPENDING ON CLINICAL CONDITION.  Note: This dictation was prepared with Dragon dictation along with smaller phrase technology. Any transcriptional errors that result from this process are unintentional.  Kimiya Brunelle M.D on 04/14/2016 at 1:39 PM  Between 7am to 6pm - Pager - [phone]  After 6pm go to [email or web address] - password [email or web address]  Office  [phone]  CC: Primary care physician; Valera Castle, MD

## 2016-04-15 ENCOUNTER — Encounter: Payer: Self-pay | Admitting: Cardiovascular Disease

## 2016-04-15 LAB — ECHOCARDIOGRAM COMPLETE
Height: 72 in
Weight: 3838.4 oz

## 2016-04-15 MED ORDER — VERAPAMIL HCL ER 180 MG PO TBCR: 180.0000 mg | EXTENDED_RELEASE_TABLET | Freq: Every day | ORAL | 0 refills | Status: DC

## 2016-04-15 MED ORDER — TICAGRELOR 90 MG PO TABS: 90.0000 mg | ORAL_TABLET | Freq: Two times a day (BID) | ORAL | 2 refills | Status: DC

## 2016-04-15 MED ORDER — ATORVASTATIN CALCIUM 40 MG PO TABS: 40.0000 mg | ORAL_TABLET | Freq: Every day | ORAL | 1 refills | Status: DC

## 2016-04-15 MED ORDER — METOPROLOL TARTRATE 25 MG PO TABS: 12.5000 mg | ORAL_TABLET | Freq: Two times a day (BID) | ORAL | 0 refills | Status: DC

## 2016-04-15 MED ORDER — ASPIRIN 81 MG PO CHEW: 81.0000 mg | CHEWABLE_TABLET | Freq: Every day | ORAL | 1 refills | Status: DC

## 2016-04-15 NOTE — Progress Notes (Signed)
Patient is post day 1 of heart catheterization via the r groin. He remained hemodynamically stable overnight with VS WDL for patient. Cath site is free from redness pain or drainage with palpable pulses on all extremities.

## 2016-04-15 NOTE — Progress Notes (Signed)
Per dr. Humphrey Rolls, cardiac rehab will be set up outpatient at his office.

## 2016-04-15 NOTE — Progress Notes (Signed)
Patient being discharged to home with his wife.  Instructions given regarding medication, post cath care, and appointments.  Cath site is clean, dry, and surrounding tissue is soft.

## 2016-04-15 NOTE — Discharge Summary (Signed)
[facility] [place] NAME: Carlos Diaz    MR#:  [id number]  DATE OF BIRTH:  [birth year]  DATE OF ADMISSION:  04/13/2016 ADMITTING PHYSICIAN: Gladstone Lighter, MD  DATE OF DISCHARGE: 04/15/16  PRIMARY CARE PHYSICIAN: Valera Castle, MD    ADMISSION DIAGNOSIS:  NSTEMI (non-ST elevated myocardial infarction) ([place]) [I21.4] Chest pain, unspecified chest pain type [R07.9]  DISCHARGE DIAGNOSIS:  Acute NSTEMI  CAD s/p PCI and DES to RIM HL HTN  SECONDARY DIAGNOSIS:   Past Medical History:  Diagnosis Date  . Anxiety   . Hypertension   . Lyme disease     HOSPITAL COURSE:   55 y.o.malewith a history of hypertension, anxiety, Lyme diseasenow being admitted with: 1. ACute non-ST elevation MI --cont  aspirin, Plavix, beta blocker, statin and Lovenox full dose.  -mild increase in the trend of troponins -seen by dr Humphrey Rolls.  -s/p PCI and DES in 95% stenosed RIM -asa,brilinta,statins, BB   2. Hypertension-continue verapamil  3. Anxiety continue Paxil  4. HL, new -cont statins  Overall stable  D/c home  CONSULTS OBTAINED:  Treatment Team:  Dionisio Jovan, MD  DRUG ALLERGIES:  No Known Allergies  DISCHARGE MEDICATIONS:   Current Discharge Medication List    START taking these medications   Details  aspirin 81 MG chewable tablet Chew 1 tablet (81 mg total) by mouth daily. Qty: 30 tablet, Refills: 1    atorvastatin (LIPITOR) 40 MG tablet Take 1 tablet (40 mg total) by mouth daily. Qty: 30 tablet, Refills: 1    metoprolol tartrate (LOPRESSOR) 25 MG tablet Take 0.5 tablets (12.5 mg total) by mouth 2 (two) times daily. Qty: 60 tablet, Refills: 0    ticagrelor (BRILINTA) 90 MG TABS tablet Take 1 tablet (90 mg total) by mouth 2 (two) times daily. Qty: 60 tablet, Refills: 2      CONTINUE these medications which have CHANGED   Details  verapamil (CALAN-SR) 180 MG CR tablet Take 1 tablet (180 mg total) by  mouth at bedtime. Qty: 30 tablet, Refills: 0      CONTINUE these medications which have NOT CHANGED   Details  PARoxetine (PAXIL) 30 MG tablet Take 30 mg by mouth daily.        If you experience worsening of your admission symptoms, develop shortness of breath, life threatening emergency, suicidal or homicidal thoughts you must seek medical attention immediately by calling 911 or calling your MD immediately  if symptoms less severe.  You Must read complete instructions/literature along with all the possible adverse reactions/side effects for all the Medicines you take and that have been prescribed to you. Take any new Medicines after you have completely understood and accept all the possible adverse reactions/side effects.   Please note  You were cared for by a hospitalist during your hospital stay. If you have any questions about your discharge medications or the care you received while you were in the hospital after you are discharged, you can call the unit and asked to speak with the hospitalist on call if the hospitalist that took care of you is not available. Once you are discharged, your primary care physician will handle any further medical issues. Please note that NO REFILLS for any discharge medications will be authorized once you are discharged, as it is imperative that you return to your primary care physician (or establish a relationship with a primary care physician if you do not have one) for your aftercare  needs so that they can reassess your need for medications and monitor your lab values. Today   SUBJECTIVE    No complaints VITAL SIGNS:  Blood pressure 120/71, pulse (!) 55, temperature 98.6 F (37 C), temperature source Oral, resp. rate 16, height 6' (1.829 m), weight 239 lb 14.4 oz (108.8 kg), SpO2 99 %.  I/O:   Intake/Output Summary (Last 24 hours) at 04/15/16 0951 Last data filed at 04/15/16 0700  Gross per 24 hour  Intake           152.07 ml  Output              1200 ml  Net         -1047.93 ml    PHYSICAL EXAMINATION:  GENERAL:  55 y.o.-year-old patient lying in the bed with no acute distress.  EYES: Pupils equal, round, reactive to light and accommodation. No scleral icterus. Extraocular muscles intact.  HEENT: Head atraumatic, normocephalic. Oropharynx and nasopharynx clear.  NECK:  Supple, no jugular venous distention. No thyroid enlargement, no tenderness.  LUNGS: Normal breath sounds bilaterally, no wheezing, rales,rhonchi or crepitation. No use of accessory muscles of respiration.  CARDIOVASCULAR: S1, S2 normal. No murmurs, rubs, or gallops.  ABDOMEN: Soft, non-tender, non-distended. Bowel sounds present. No organomegaly or mass.  EXTREMITIES: No pedal edema, cyanosis, or clubbing.  NEUROLOGIC: Cranial nerves II through XII are intact. Muscle strength 5/5 in all extremities. Sensation intact. Gait not checked.  PSYCHIATRIC: The patient is alert and oriented x 3.  SKIN: No obvious rash, lesion, or ulcer.   DATA REVIEW:   CBC   Recent Labs Lab 04/14/16 0623  WBC 9.7  HGB 14.0  HCT 40.3  PLT 200    Chemistries   Recent Labs Lab 04/13/16 1809 04/14/16 0623  NA 140 139  K 3.5 3.8  CL 107 107  CO2 30 24  GLUCOSE 136* 98  BUN 14 11  CREATININE 1.05 0.82  CALCIUM 8.9 8.7*  AST 18  --   ALT 16*  --   ALKPHOS 49  --   BILITOT 0.5  --     Microbiology Results   No results found for this or any previous visit (from the past 240 hour(s)).  RADIOLOGY:  Dg Chest 2 View  Result Date: 04/13/2016 CLINICAL DATA:  Pt arrived by EMS from home with c/o anterior chest pain that was sharp and sudden moving from the left side to the right, also c/o pain in left upper arm, h/o htn, no other known heart or lung conditions; non smoker. EXAM: CHEST  2 VIEW COMPARISON:  None. FINDINGS: The heart size and mediastinal contours are within normal limits. Both lungs are clear. No pleural effusion. Mild lower thoracic spondylosis. IMPRESSION: 1.   No active cardiopulmonary disease is radiographically apparent. Electronically Signed   By: Van Clines M.D.   On: 04/13/2016 18:23     Management plans discussed with the patient, family and they are in agreement.  CODE STATUS:     Code Status Orders        Start     Ordered   04/14/16 1631  Full code  Continuous     04/14/16 1630    Code Status History    Date Active Date Inactive Code Status Order ID Comments User Context   04/14/2016  4:30 PM 04/14/2016  5:02 PM Full Code [id number]  Yolonda Kida, MD Inpatient   04/14/2016 12:09 AM 04/14/2016 12:37 PM Full Code [id number]  Ubaldo Glassing  Hugelmeyer, DO Inpatient    Advance Directive Documentation   Flowsheet Row Most Recent Value  Type of Advance Directive  Healthcare Power of Attorney  Pre-existing out of facility DNR order (yellow form or pink MOST form)  No data  "MOST" Form in Place?  No data      TOTAL TIME TAKING CARE OF THIS PATIENT: 40 minutes.    Aija Scarfo M.D on 04/15/2016 at 9:51 AM  Between 7am to 6pm - Pager - [phone] After 6pm go to [email or web address] - password [email or web address]  Office  [phone]  CC: Primary care physician; Valera Castle, MD

## 2016-04-15 NOTE — Progress Notes (Signed)
SUBJECTIVE: Patient denies any chest pain or shortness of breath   Vitals:   04/14/16 1930 04/14/16 2135 04/15/16 0609 04/15/16 0840  BP: 128/75 109/61 108/62 120/71  Pulse: (!) 52 (!) 54 (!) 58 (!) 55  Resp: 18  18 16   Temp: 99.2 F (37.3 C)  98.5 F (36.9 C) 98.6 F (37 C)  TempSrc: Oral  Oral Oral  SpO2: 100%  100% 99%  Weight:      Height:        Intake/Output Summary (Last 24 hours) at 04/15/16 0851 Last data filed at 04/15/16 0700  Gross per 24 hour  Intake           152.07 ml  Output             1200 ml  Net         -1047.93 ml    LABS: Basic Metabolic Panel:  Recent Labs  04/13/16 1809 04/14/16 0623  NA 140 139  K 3.5 3.8  CL 107 107  CO2 30 24  GLUCOSE 136* 98  BUN 14 11  CREATININE 1.05 0.82  CALCIUM 8.9 8.7*   Liver Function Tests:  Recent Labs  04/13/16 1809  AST 18  ALT 16*  ALKPHOS 49  BILITOT 0.5  PROT 6.7  ALBUMIN 4.2    Recent Labs  04/13/16 1809  LIPASE 20   CBC:  Recent Labs  04/13/16 1809 04/14/16 0623  WBC 8.2 9.7  NEUTROABS 5.2  --   HGB 13.7 14.0  HCT 39.4* 40.3  MCV 88.5 87.5  PLT 194 200   Cardiac Enzymes:  Recent Labs  04/14/16 0031 04/14/16 0623 04/14/16 1643  TROPONINI 0.13* 0.35* 0.70*   BNP: Invalid input(s): POCBNP D-Dimer: No results for input(s): DDIMER in the last 72 hours. Hemoglobin A1C:  Recent Labs  04/14/16 0031  HGBA1C 5.2   Fasting Lipid Panel:  Recent Labs  04/14/16 0623  CHOL 192  HDL 35*  LDLCALC 126*  TRIG 154*  CHOLHDL 5.5   Thyroid Function Tests:  Recent Labs  04/14/16 0031  TSH 3.362   Anemia Panel: No results for input(s): VITAMINB12, FOLATE, FERRITIN, TIBC, IRON, RETICCTPCT in the last 72 hours.   PHYSICAL EXAM General: Well developed, well nourished, in no acute distress HEENT:  Normocephalic and atramatic Neck:  No JVD.  Lungs: Clear bilaterally to auscultation and percussion. Heart: HRRR . Normal S1 and S2 without gallops or murmurs.  Abdomen:  Bowel sounds are positive, abdomen soft and non-tender  Msk:  Back normal, normal gait. Normal strength and tone for age. Extremities: No clubbing, cyanosis or edema.   Neuro: Alert and oriented X 3. Psych:  Good affect, responds appropriately  TELEMETRY:Sinus rhythm  ASSESSMENT AND PLAN: Non-STEMI with high-grade lesion about 95% mid ramus intermedius which had PCI with drug-eluting stent. There is moderate disease in first OM as well as left circumflex and mid LAD with normal left reticular systolic function. Patient is chest pain-free and can go home with follow-up in the office next Tuesday at 2 PM. Right groin looks okay. Patient can go home on current medicines.  Active Problems:   NSTEMI (non-ST elevated myocardial infarction) ([place])    Neoma Laming A, MD, [facility] 04/15/2016 8:51 AM

## 2016-04-24 ENCOUNTER — Emergency Department: Payer: BLUE CROSS/BLUE SHIELD

## 2016-04-24 ENCOUNTER — Encounter: Payer: Self-pay | Admitting: Emergency Medicine

## 2016-04-24 ENCOUNTER — Emergency Department
Admission: EM | Admit: 2016-04-24 | Discharge: 2016-04-24 | Disposition: A | Discharge status: Home / Self Care | Payer: BLUE CROSS/BLUE SHIELD | Attending: Emergency Medicine | Admitting: Emergency Medicine

## 2016-04-24 DIAGNOSIS — R0789 Other chest pain: Secondary | ICD-10-CM | POA: Diagnosis not present

## 2016-04-24 DIAGNOSIS — I252 Old myocardial infarction: Secondary | ICD-10-CM | POA: Insufficient documentation

## 2016-04-24 DIAGNOSIS — I10 Essential (primary) hypertension: Secondary | ICD-10-CM | POA: Diagnosis not present

## 2016-04-24 LAB — TROPONIN I: Troponin I: <0.03 ng/mL (ref <0.03)

## 2016-04-24 LAB — BASIC METABOLIC PANEL
ANION GAP: 6 (ref 5–15)
BUN: 15 mg/dL (ref 6–20)
CHLORIDE: 105 mmol/L (ref 101–111)
CO2: 28 mmol/L (ref 22–32)
CREATININE: 1.05 mg/dL (ref 0.61–1.24)
Calcium: 9.1 mg/dL (ref 8.9–10.3)
GFR calc non Af Amer: >60 mL/min (ref >60)
Glucose, Bld: 90 mg/dL (ref 65–99)
POTASSIUM: 3.6 mmol/L (ref 3.5–5.1)
SODIUM: 139 mmol/L (ref 135–145)

## 2016-04-24 LAB — CBC
HCT: 38.4 % — ABNORMAL LOW (ref 40.0–52.0)
Hemoglobin: 13.2 g/dL (ref 13.0–18.0)
MCH: 30.1 pg (ref 26.0–34.0)
MCHC: 34.4 g/dL (ref 32.0–36.0)
MCV: 87.7 fL (ref 80.0–100.0)
PLATELETS: 212 10*3/uL (ref 150–440)
RBC: 4.38 MIL/uL — ABNORMAL LOW (ref 4.40–5.90)
RDW: 13.5 % (ref 11.5–14.5)
WBC: 9.1 10*3/uL (ref 3.8–10.6)

## 2016-04-24 MED ORDER — PANTOPRAZOLE SODIUM 40 MG PO TBEC
DELAYED_RELEASE_TABLET | ORAL | Status: AC
  Administered 2016-04-24: 80 mg via ORAL
  Filled 2016-04-24: qty 2

## 2016-04-24 MED ORDER — ESOMEPRAZOLE MAGNESIUM 40 MG PO CPDR: 40.0000 mg | DELAYED_RELEASE_CAPSULE | Freq: Every day | ORAL | 1 refills | Status: DC

## 2016-04-24 MED ORDER — PANTOPRAZOLE SODIUM 40 MG PO TBEC
80.0000 mg | DELAYED_RELEASE_TABLET | Freq: Every day | ORAL | Status: DC
  Administered 2016-04-24: 80 mg via ORAL

## 2016-04-24 MED ORDER — ISOSORBIDE MONONITRATE ER 30 MG PO TB24: 30.0000 mg | ORAL_TABLET | Freq: Every day | ORAL | 0 refills | Status: DC

## 2016-04-24 MED ORDER — ISOSORBIDE MONONITRATE ER 30 MG PO TB24
30.0000 mg | ORAL_TABLET | Freq: Every day | ORAL | Status: DC
  Administered 2016-04-24: 30 mg via ORAL
  Filled 2016-04-24: qty 1

## 2016-04-24 NOTE — ED Triage Notes (Signed)
Pt from home with chest pain; had stent placed on 8/14 and has been following up with cardiologist. Had CT scan yesterday to confirm stent OK, and it was fine. Cardiologist told him to come here since he was still having left sided chest pain that radiated to back and left arm. NAD noted.

## 2016-04-24 NOTE — Discharge Instructions (Signed)
Take imdur and nexium as prescribed.   See Dr. Humphrey Rolls in office tomorrow at 10 am  Return to ER if you have worse chest pain, shortness of breath.

## 2016-04-24 NOTE — ED Provider Notes (Signed)
[facility] Provider Note   CSN: [id number] Arrival date & time: 04/24/16  1723     History   Chief Complaint Chief Complaint  Patient presents with  . Chest Pain    HPI Carlos Diaz is a 55 y.o. male hx of HTN, recent NSTEMI with stent 2 weeks ago, Here presenting with chest pain. States that since his stent he has been having some nausea and some chest tightness. It is definitely not getting worse or better. He states that he is not back to his baseline functional status. States that it is worse when he exerts himself. He has seen Dr. Humphrey Diaz since then and had coronary CT in the office yesterday that showed some diffuse LAD disease but stent is in place. He is sent here since he is not feeling better. Denies shortness of breath.   The history is provided by the patient.    Past Medical History:  Diagnosis Date  . Anxiety   . Hypertension   . Lyme disease     Patient Active Problem List   Diagnosis Date Noted  . NSTEMI (non-ST elevated myocardial infarction) ([place]) 04/13/2016    Past Surgical History:  Procedure Laterality Date  . CARDIAC CATHETERIZATION Right 04/14/2016   Procedure: Left Heart Cath and Coronary Angiography;  Surgeon: Carlos Daqwan, MD;  Location: [facility];  Service: Cardiovascular;  Laterality: Right;  . CARDIAC CATHETERIZATION N/A 04/14/2016   Procedure: Coronary Stent Intervention;  Surgeon: Carlos Kida, MD;  Location: [facility];  Service: Cardiovascular;  Laterality: N/A;       Home Medications    Prior to Admission medications   Medication Sig Start Date End Date Taking? Authorizing Provider  aspirin 81 MG chewable tablet Chew 1 tablet (81 mg total) by mouth daily. 04/15/16   Fritzi Mandes, MD  atorvastatin (LIPITOR) 40 MG tablet Take 1 tablet (40 mg total) by mouth daily. 04/15/16   Fritzi Mandes, MD  metoprolol tartrate (LOPRESSOR) 25 MG tablet Take 0.5 tablets (12.5 mg total) by mouth 2 (two) times daily.  04/15/16   Fritzi Mandes, MD  PARoxetine (PAXIL) 30 MG tablet Take 30 mg by mouth daily.    Historical Provider, MD  ticagrelor (BRILINTA) 90 MG TABS tablet Take 1 tablet (90 mg total) by mouth 2 (two) times daily. 04/15/16   Fritzi Mandes, MD  verapamil (CALAN-SR) 180 MG CR tablet Take 1 tablet (180 mg total) by mouth at bedtime. 04/15/16   Fritzi Mandes, MD    Family History History reviewed. No pertinent family history.  Social History Social History  Substance Use Topics  . Smoking status: Never Smoker  . Smokeless tobacco: Never Used  . Alcohol use No     Allergies   Review of patient's allergies indicates no known allergies.   Review of Systems Review of Systems  Cardiovascular: Positive for chest pain.  All other systems reviewed and are negative.    Physical Exam Updated Vital Signs BP 121/83   Pulse (!) 54   Temp 98.7 F (37.1 C) (Oral)   Resp 17   Ht 6' (1.829 m)   Wt 233 lb (105.7 kg)   SpO2 100%   BMI 31.60 kg/m   Physical Exam  Constitutional: He is oriented to person, place, and time. He appears well-developed and well-nourished.  Slightly anxious   HENT:  Head: Normocephalic.  Eyes: EOM are normal. Pupils are equal, round, and reactive to light.  Neck: Normal range of motion. Neck  supple.  Cardiovascular: Normal rate, regular rhythm and normal heart sounds.   Pulmonary/Chest: Effort normal and breath sounds normal. No respiratory distress. He has no wheezes. He has no rales.  Abdominal: Soft. Bowel sounds are normal. He exhibits no distension. There is no tenderness. There is no guarding.  Musculoskeletal: Normal range of motion.  Neurological: He is alert and oriented to person, place, and time.  Skin: Skin is warm.  Psychiatric: He has a normal mood and affect.  Nursing note and vitals reviewed.    ED Treatments / Results  Labs (all labs ordered are listed, but only abnormal results are displayed) Labs Reviewed  CBC - Abnormal; Notable for the  following:       Result Value   RBC 4.38 (*)    HCT 38.4 (*)    All other components within normal limits  BASIC METABOLIC PANEL  TROPONIN I    EKG  EKG Interpretation None      ED ECG REPORT I, Carlos Diaz, the attending physician, personally viewed and interpreted this ECG.   Date: 04/24/2016  EKG Time: 18:28 pm  Rate: 52  Rhythm: normal EKG, normal sinus rhythm  Axis: normal  Intervals:none  ST&T Change: nonspecific    Radiology Dg Chest 2 View  Result Date: 04/24/2016 CLINICAL DATA:  Chest pain. EXAM: CHEST  2 VIEW COMPARISON:  April 13, 2016 FINDINGS: The heart size and mediastinal contours are within normal limits. Both lungs are clear. The visualized skeletal structures are unremarkable. IMPRESSION: No active cardiopulmonary disease. Electronically Signed   By: Carlos Diaz III M.D   On: 04/24/2016 19:41    Procedures Procedures (including critical care time)  Medications Ordered in ED Medications  pantoprazole (PROTONIX) EC tablet 80 mg (80 mg Oral Given 04/24/16 2144)  isosorbide mononitrate (IMDUR) 24 hr tablet 30 mg (30 mg Oral Given 04/24/16 2144)     Initial Impression / Assessment and Plan / ED Course  I have reviewed the triage vital signs and the nursing notes.  Pertinent labs & imaging results that were available during my care of the patient were reviewed by me and considered in my medical decision making (see chart for details).  Clinical Course    Carlos Diaz is a 55 y.o. male here with chest pain. Persistent pain since the stent. Vitals stable. Well appearing. Pain is not getting worse. His EKG is unchanged and trop neg. I called Dr. Humphrey Diaz, who knows him well. He states that coronary CT showed that stent is in place and that he still has diffuse disease and likely has stable angina. He recommend imdur, nexium (in case its GI), follow up with him tomorrow at 10 am.    Final Clinical Impressions(s) / ED Diagnoses   Final diagnoses:  None      New Prescriptions New Prescriptions   No medications on file     Drenda Freeze, MD 04/24/16 2231

## 2016-05-20 MED ORDER — ACETAMINOPHEN 325 MG PO TABS
ORAL_TABLET | ORAL | Status: AC
  Filled 2016-05-20: qty 2

## 2016-05-20 MED ORDER — TETANUS-DIPHTH-ACELL PERTUSSIS 5-2.5-18.5 LF-MCG/0.5 IM SUSP
INTRAMUSCULAR | Status: AC
  Filled 2016-05-20: qty 0.5

## 2016-06-02 ENCOUNTER — Encounter: Payer: BLUE CROSS/BLUE SHIELD | Attending: Cardiovascular Disease | Admitting: *Deleted

## 2016-06-02 ENCOUNTER — Other Ambulatory Visit: Payer: Self-pay | Admitting: *Deleted

## 2016-06-02 VITALS — Ht 72.2 in | Wt 234.5 lb

## 2016-06-02 DIAGNOSIS — I10 Essential (primary) hypertension: Secondary | ICD-10-CM | POA: Insufficient documentation

## 2016-06-02 DIAGNOSIS — Z48812 Encounter for surgical aftercare following surgery on the circulatory system: Secondary | ICD-10-CM | POA: Insufficient documentation

## 2016-06-02 DIAGNOSIS — Z9889 Other specified postprocedural states: Secondary | ICD-10-CM | POA: Diagnosis not present

## 2016-06-02 DIAGNOSIS — F32A Depression, unspecified: Secondary | ICD-10-CM | POA: Insufficient documentation

## 2016-06-02 DIAGNOSIS — F419 Anxiety disorder, unspecified: Secondary | ICD-10-CM | POA: Insufficient documentation

## 2016-06-02 DIAGNOSIS — Z955 Presence of coronary angioplasty implant and graft: Secondary | ICD-10-CM | POA: Diagnosis present

## 2016-06-02 DIAGNOSIS — E785 Hyperlipidemia, unspecified: Secondary | ICD-10-CM | POA: Insufficient documentation

## 2016-06-02 DIAGNOSIS — A6920 Lyme disease, unspecified: Secondary | ICD-10-CM | POA: Insufficient documentation

## 2016-06-02 DIAGNOSIS — I214 Non-ST elevation (NSTEMI) myocardial infarction: Secondary | ICD-10-CM | POA: Diagnosis not present

## 2016-06-02 DIAGNOSIS — F329 Major depressive disorder, single episode, unspecified: Secondary | ICD-10-CM | POA: Insufficient documentation

## 2016-06-02 NOTE — Patient Instructions (Signed)
Patient Instructions  Patient Details  Name: Carlos Diaz MRN: [id number] Date of Birth: 01-01-1961 Referring Provider:  Dionisio Pheonix, MD  Below are the personal goals you chose as well as exercise and nutrition goals. Our goal is to help you keep on track towards obtaining and maintaining your goals. We will be discussing your progress on these goals with you throughout the program.  Initial Exercise Prescription:     Initial Exercise Prescription - 06/02/16 1500      Date of Initial Exercise RX and Referring Provider   Date 06/02/16   Referring Provider Neoma Laming MD     Treadmill   MPH 3.5   Grade 2   Minutes 15   METs 4.64     Elliptical   Level 2   Speed 4.6   Minutes 15     REL-XR   Level 4   Watts --  speed 50 rpm   Minutes 15   METs 4.5     Prescription Details   Frequency (times per week) 3   Duration Progress to 45 minutes of aerobic exercise without signs/symptoms of physical distress     Intensity   THRR 40-80% of Max Heartrate 106-144   Ratings of Perceived Exertion 11-13   Perceived Dyspnea 0-4     Progression   Progression Continue to progress workloads to maintain intensity without signs/symptoms of physical distress.     Resistance Training   Training Prescription Yes   Weight 5 lbs   Reps 10-12      Exercise Goals: Frequency: Be able to perform aerobic exercise three times per week working toward 3-5 days per week.  Intensity: Work with a perceived exertion of 11 (fairly light) - 15 (hard) as tolerated. Follow your new exercise prescription and watch for changes in prescription as you progress with the program. Changes will be reviewed with you when they are made.  Duration: You should be able to do 30 minutes of continuous aerobic exercise in addition to a 5 minute warm-up and a 5 minute cool-down routine.  Nutrition Goals: Your personal nutrition goals will be established when you do your nutrition analysis with the  dietician.  The following are nutrition guidelines to follow: Cholesterol < 200mg /day Sodium < 1500mg /day Fiber: Men over 50 yrs - 30 grams per day  Personal Goals:     Personal Goals and Risk Factors at Admission - 06/02/16 1431      Core Components/Risk Factors/Patient Goals on Admission    Weight Management Yes;Weight Maintenance   Intervention Weight Management: Develop a combined nutrition and exercise program designed to reach desired caloric intake, while maintaining appropriate intake of nutrient and fiber, sodium and fats, and appropriate energy expenditure required for the weight goal.;Weight Management: Provide education and appropriate resources to help participant work on and attain dietary goals.   Expected Outcomes Weight Maintenance: Understanding of the daily nutrition guidelines, which includes 25-35% calories from fat, 7% or less cal from saturated fats, less than 200mg  cholesterol, less than 1.5gm of sodium, & 5 or more servings of fruits and vegetables daily   Increase Strength and Stamina Yes   Intervention Provide advice, education, support and counseling about physical activity/exercise needs.   Expected Outcomes Achievement of increased cardiorespiratory fitness and enhanced flexibility, muscular endurance and strength shown through measurements of functional capacity and personal statement of participant.   Hypertension Yes   Intervention Provide education on lifestyle modifcations including regular physical activity/exercise, weight management, moderate sodium restriction and increased  consumption of fresh fruit, vegetables, and low fat dairy, alcohol moderation, and smoking cessation.;Monitor prescription use compliance.   Expected Outcomes Short Term: Continued assessment and intervention until BP is < 140/51mm HG in hypertensive participants. < 130/70mm HG in hypertensive participants with diabetes, heart failure or chronic kidney disease.;Long Term: Maintenance of  blood pressure at goal levels.   Lipids Yes   Intervention Provide education and support for participant on nutrition & aerobic/resistive exercise along with prescribed medications to achieve LDL 70mg , HDL >40mg .   Expected Outcomes Short Term: Participant states understanding of desired cholesterol values and is compliant with medications prescribed. Participant is following exercise prescription and nutrition guidelines.;Long Term: Cholesterol controlled with medications as prescribed, with individualized exercise RX and with personalized nutrition plan. Value goals: LDL < 70mg , HDL > 40 mg.   Stress Yes   Intervention Offer individual and/or small group education and counseling on adjustment to heart disease, stress management and health-related lifestyle change. Teach and support self-help strategies.;Refer participants experiencing significant psychosocial distress to appropriate mental health specialists for further evaluation and treatment. When possible, include family members and significant others in education/counseling sessions.   Expected Outcomes Short Term: Participant demonstrates changes in health-related behavior, relaxation and other stress management skills, ability to obtain effective social support, and compliance with psychotropic medications if prescribed.;Long Term: Emotional wellbeing is indicated by absence of clinically significant psychosocial distress or social isolation.      Tobacco Use Initial Evaluation: History  Smoking Status  . Never Smoker  Smokeless Tobacco  . Never Used    Copy of goals given to participant.

## 2016-06-02 NOTE — Progress Notes (Signed)
Cardiac Individual Treatment Plan  Patient Details  Name: Carlos Diaz MRN: [id number] Date of Birth: 1961-08-17 Referring Provider:   Flowsheet Row Cardiac Rehab from 06/02/2016 in [facility] Cardiac and Pulmonary Rehab  Referring Provider  Neoma Laming MD      Initial Encounter Date:  Flowsheet Row Cardiac Rehab from 06/02/2016 in [facility] Cardiac and Pulmonary Rehab  Date  06/02/16  Referring Provider  Neoma Laming MD      Visit Diagnosis: NSTEMI (non-ST elevated myocardial infarction) [facility])  Status post coronary artery stent placement  Patient's Home Medications on Admission:  Current Outpatient Prescriptions:  .  aspirin 81 MG chewable tablet, Chew 1 tablet (81 mg total) by mouth daily., Disp: 30 tablet, Rfl: 1 .  atorvastatin (LIPITOR) 40 MG tablet, Take 1 tablet (40 mg total) by mouth daily., Disp: 30 tablet, Rfl: 1 .  citalopram (CELEXA) 20 MG tablet, Take by mouth., Disp: , Rfl:  .  clonazePAM (KLONOPIN) 0.5 MG tablet, Take by mouth., Disp: , Rfl:  .  pantoprazole (PROTONIX) 40 MG tablet, Take by mouth., Disp: , Rfl:  .  ranolazine (RANEXA) 500 MG 12 hr tablet, Take by mouth., Disp: , Rfl:  .  sucralfate (CARAFATE) 1 g tablet, Take by mouth., Disp: , Rfl:  .  ticagrelor (BRILINTA) 90 MG TABS tablet, Take 1 tablet (90 mg total) by mouth 2 (two) times daily., Disp: 60 tablet, Rfl: 2  Past Medical History: Past Medical History:  Diagnosis Date  . Anxiety   . Hypertension   . Lyme disease     Tobacco Use: History  Smoking Status  . Never Smoker  Smokeless Tobacco  . Never Used    Labs: Recent Review Flowsheet Data    Labs for ITP Cardiac and Pulmonary Rehab Latest Ref Rng & Units 04/14/2016   Cholestrol 0 - 200 mg/dL 192   LDLCALC 0 - 99 mg/dL 126(H)   HDL >40 mg/dL 35(L)   Trlycerides <150 mg/dL 154(H)   Hemoglobin A1c 4.0 - 6.0 % 5.2       Exercise Target Goals: Date: 06/02/16  Exercise Program Goal: Individual exercise prescription set with THRR, safety &  activity barriers. Participant demonstrates ability to understand and report RPE using BORG scale, to self-measure pulse accurately, and to acknowledge the importance of the exercise prescription.  Exercise Prescription Goal: Starting with aerobic activity 30 plus minutes a day, 3 days per week for initial exercise prescription. Provide home exercise prescription and guidelines that participant acknowledges understanding prior to discharge.  Activity Barriers & Risk Stratification:     Activity Barriers & Cardiac Risk Stratification - 06/02/16 1430      Activity Barriers & Cardiac Risk Stratification   Activity Barriers Joint Problems  Both hands with joint pain with gripping and use of hands. History of Lyme Disease.  Previously dislocated shoulder   Cardiac Risk Stratification High      6 Minute Walk:     6 Minute Walk    Row Name 06/02/16 1513         6 Minute Walk   Phase Initial     Distance 1900 feet     Walk Time 6 minutes     # of Rest Breaks 0     MPH 3.6     METS 4.53     RPE 11     VO2 Peak 15.84     Symptoms No     Resting HR 68 bpm     Resting BP 122/70  Max Ex. HR 108 bpm     Max Ex. BP 130/76     2 Minute Post BP 126/70        Initial Exercise Prescription:     Initial Exercise Prescription - 06/02/16 1500      Date of Initial Exercise RX and Referring Provider   Date 06/02/16   Referring Provider Neoma Laming MD     Treadmill   MPH 3.5   Grade 2   Minutes 15   METs 4.64     Elliptical   Level 2   Speed 4.6   Minutes 15     REL-XR   Level 4   Watts --  speed 50 rpm   Minutes 15   METs 4.5     Prescription Details   Frequency (times per week) 3   Duration Progress to 45 minutes of aerobic exercise without signs/symptoms of physical distress     Intensity   THRR 40-80% of Max Heartrate 106-144   Ratings of Perceived Exertion 11-13   Perceived Dyspnea 0-4     Progression   Progression Continue to progress workloads to  maintain intensity without signs/symptoms of physical distress.     Resistance Training   Training Prescription Yes   Weight 5 lbs   Reps 10-12      Perform Capillary Blood Glucose checks as needed.  Exercise Prescription Changes:     Exercise Prescription Changes    Row Name 06/02/16 1500             Exercise Review   Progression -  walk test results         Response to Exercise   Blood Pressure (Admit) 122/70       Blood Pressure (Exercise) 130/76       Blood Pressure (Exit) 126/70       Heart Rate (Admit) 68 bpm       Heart Rate (Exercise) 108 bpm       Heart Rate (Exit) 71 bpm       Oxygen Saturation (Admit) 96 %       Oxygen Saturation (Exit) 100 %       Rating of Perceived Exertion (Exercise) 11       Symptoms none          Exercise Comments:     Exercise Comments    Row Name 06/02/16 1520           Exercise Comments Exercise goal is to be able to return to his previous exercise routine with confidence (swimming and lifting)          Discharge Exercise Prescription (Final Exercise Prescription Changes):     Exercise Prescription Changes - 06/02/16 1500      Exercise Review   Progression --  walk test results     Response to Exercise   Blood Pressure (Admit) 122/70   Blood Pressure (Exercise) 130/76   Blood Pressure (Exit) 126/70   Heart Rate (Admit) 68 bpm   Heart Rate (Exercise) 108 bpm   Heart Rate (Exit) 71 bpm   Oxygen Saturation (Admit) 96 %   Oxygen Saturation (Exit) 100 %   Rating of Perceived Exertion (Exercise) 11   Symptoms none      Nutrition:  Target Goals: Understanding of nutrition guidelines, daily intake of sodium 1500mg , cholesterol 200mg , calories 30% from fat and 7% or less from saturated fats, daily to have 5 or more servings of fruits and vegetables.  Biometrics:  Pre Biometrics - 06/02/16 1522      Pre Biometrics   Height 6' 0.2" (1.834 m)   Weight 234 lb 8 oz (106.4 kg)   Waist Circumference 41  inches   Hip Circumference 41 inches   Waist to Hip Ratio 1 %   BMI (Calculated) 31.7   Single Leg Stand 30 seconds       Nutrition Therapy Plan and Nutrition Goals:     Nutrition Therapy & Goals - 06/02/16 1418      Intervention Plan   Intervention Prescribe, educate and counsel regarding individualized specific dietary modifications aiming towards targeted core components such as weight, hypertension, lipid management, diabetes, heart failure and other comorbidities.   Expected Outcomes Short Term Goal: Understand basic principles of dietary content, such as calories, fat, sodium, cholesterol and nutrients.;Short Term Goal: A plan has been developed with personal nutrition goals set during dietitian appointment.;Long Term Goal: Adherence to prescribed nutrition plan.      Nutrition Discharge: Rate Your Plate Scores:     Nutrition Assessments - 06/02/16 1418      Rate Your Plate Scores   Pre Score 67   Pre Score % 80 %      Nutrition Goals Re-Evaluation:   Psychosocial: Target Goals: Acknowledge presence or absence of depression, maximize coping skills, provide positive support system. Participant is able to verbalize types and ability to use techniques and skills needed for reducing stress and depression.  Initial Review & Psychosocial Screening:     Initial Psych Review & Screening - 06/02/16 1424      Initial Review   Current issues with Current Depression;Current Anxiety/Panic;Current Psychotropic Meds;Current Stress Concerns   Source of Stress Concerns Chronic Illness;Financial  Stress caused by chronic illness that went years before diagnosed.  treatment expensive and traveled all over the [facility] to find a physician that could diagnose the Lyme Diseaset hat was causing all of his symptoms.    Comments Used savings up that was planned for early retirement.  Wife has same diagnosis of Lyme Disease and again spemt money for treatment and MD visits.     Family  Dynamics   Good Support System? Yes   Concerns Recent loss of child   Comments Son killed last year,hit by vehicle. This weighs daily on [place]     Barriers   Psychosocial barriers to participate in program There are no identifiable barriers or psychosocial needs.     Screening Interventions   Interventions Encouraged to exercise      Quality of Life Scores:     Quality of Life - 06/02/16 1424      Quality of Life Scores   Health/Function Pre 20.87 %   Socioeconomic Pre 20.5 %   Psych/Spiritual Pre 20.71 %   Family Pre 21 %   GLOBAL Pre 20.77 %      PHQ-9: Recent Review Flowsheet Data    Depression screen [facility] 2/9 06/02/2016   Decreased Interest 2   Down, Depressed, Hopeless 1   PHQ - 2 Score 3   Altered sleeping 1   Tired, decreased energy 0   Change in appetite 0   Feeling bad or failure about yourself  2   Trouble concentrating 1   Moving slowly or fidgety/restless 0   Suicidal thoughts 0   PHQ-9 Score 7   Difficult doing work/chores Not difficult at all      Psychosocial Evaluation and Intervention:   Psychosocial Re-Evaluation:   Vocational Rehabilitation: Provide vocational  rehab assistance to qualifying candidates.   Vocational Rehab Evaluation & Intervention:     Vocational Rehab - 06/02/16 1431      Initial Vocational Rehab Evaluation & Intervention   Assessment shows need for Vocational Rehabilitation No      Education: Education Goals: Education classes will be provided on a weekly basis, covering required topics. Participant will state understanding/return demonstration of topics presented.  Learning Barriers/Preferences:     Learning Barriers/Preferences - 06/02/16 1431      Learning Barriers/Preferences   Learning Barriers None   Learning Preferences Audio;Individual Instruction;Verbal Instruction      Education Topics: General Nutrition Guidelines/Fats and Fiber: -Group instruction provided by verbal, written material, models  and posters to present the general guidelines for heart healthy nutrition. Gives an explanation and review of dietary fats and fiber.   Controlling Sodium/Reading Food Labels: -Group verbal and written material supporting the discussion of sodium use in heart healthy nutrition. Review and explanation with models, verbal and written materials for utilization of the food label.   Exercise Physiology & Risk Factors: - Group verbal and written instruction with models to review the exercise physiology of the cardiovascular system and associated critical values. Details cardiovascular disease risk factors and the goals associated with each risk factor.   Aerobic Exercise & Resistance Training: - Gives group verbal and written discussion on the health impact of inactivity. On the components of aerobic and resistive training programs and the benefits of this training and how to safely progress through these programs.   Flexibility, Balance, General Exercise Guidelines: - Provides group verbal and written instruction on the benefits of flexibility and balance training programs. Provides general exercise guidelines with specific guidelines to those with heart or lung disease. Demonstration and skill practice provided.   Stress Management: - Provides group verbal and written instruction about the health risks of elevated stress, cause of high stress, and healthy ways to reduce stress.   Depression: - Provides group verbal and written instruction on the correlation between heart/lung disease and depressed mood, treatment options, and the stigmas associated with seeking treatment.   Anatomy & Physiology of the Heart: - Group verbal and written instruction and models provide basic cardiac anatomy and physiology, with the coronary electrical and arterial systems. Review of: AMI, Angina, Valve disease, Heart Failure, Cardiac Arrhythmia, Pacemakers, and the ICD.   Cardiac Procedures: - Group verbal  and written instruction and models to describe the testing methods done to diagnose heart disease. Reviews the outcomes of the test results. Describes the treatment choices: Medical Management, Angioplasty, or Coronary Bypass Surgery.   Cardiac Medications: - Group verbal and written instruction to review commonly prescribed medications for heart disease. Reviews the medication, class of the drug, and side effects. Includes the steps to properly store meds and maintain the prescription regimen.   Go Sex-Intimacy & Heart Disease, Get SMART - Goal Setting: - Group verbal and written instruction through game format to discuss heart disease and the return to sexual intimacy. Provides group verbal and written material to discuss and apply goal setting through the application of the S.M.A.R.T. Method.   Other Matters of the Heart: - Provides group verbal, written materials and models to describe Heart Failure, Angina, Valve Disease, and Diabetes in the realm of heart disease. Includes description of the disease process and treatment options available to the cardiac patient.   Exercise & Equipment Safety: - Individual verbal instruction and demonstration of equipment use and safety with use of the equipment.  Flowsheet Row Cardiac Rehab from 06/02/2016 in [facility] Cardiac and Pulmonary Rehab  Date  06/02/16  Educator  SB  Instruction Review Code  2- meets goals/outcomes      Infection Prevention: - Provides verbal and written material to individual with discussion of infection control including proper hand washing and proper equipment cleaning during exercise session. Flowsheet Row Cardiac Rehab from 06/02/2016 in [facility] Cardiac and Pulmonary Rehab  Date  06/02/16  Educator  SB  Instruction Review Code  2- meets goals/outcomes      Falls Prevention: - Provides verbal and written material to individual with discussion of falls prevention and safety.   Diabetes: - Individual verbal and written  instruction to review signs/symptoms of diabetes, desired ranges of glucose level fasting, after meals and with exercise. Advice that pre and post exercise glucose checks will be done for 3 sessions at entry of program.    Knowledge Questionnaire Score:     Knowledge Questionnaire Score - 06/02/16 1431      Knowledge Questionnaire Score   Pre Score 25/28      Core Components/Risk Factors/Patient Goals at Admission:     Personal Goals and Risk Factors at Admission - 06/02/16 1431      Core Components/Risk Factors/Patient Goals on Admission    Weight Management Yes;Weight Maintenance   Intervention Weight Management: Develop a combined nutrition and exercise program designed to reach desired caloric intake, while maintaining appropriate intake of nutrient and fiber, sodium and fats, and appropriate energy expenditure required for the weight goal.;Weight Management: Provide education and appropriate resources to help participant work on and attain dietary goals.   Expected Outcomes Weight Maintenance: Understanding of the daily nutrition guidelines, which includes 25-35% calories from fat, 7% or less cal from saturated fats, less than 200mg  cholesterol, less than 1.5gm of sodium, & 5 or more servings of fruits and vegetables daily   Increase Strength and Stamina Yes   Intervention Provide advice, education, support and counseling about physical activity/exercise needs.   Expected Outcomes Achievement of increased cardiorespiratory fitness and enhanced flexibility, muscular endurance and strength shown through measurements of functional capacity and personal statement of participant.   Hypertension Yes   Intervention Provide education on lifestyle modifcations including regular physical activity/exercise, weight management, moderate sodium restriction and increased consumption of fresh fruit, vegetables, and low fat dairy, alcohol moderation, and smoking cessation.;Monitor prescription use  compliance.   Expected Outcomes Short Term: Continued assessment and intervention until BP is < 140/54mm HG in hypertensive participants. < 130/2mm HG in hypertensive participants with diabetes, heart failure or chronic kidney disease.;Long Term: Maintenance of blood pressure at goal levels.   Lipids Yes   Intervention Provide education and support for participant on nutrition & aerobic/resistive exercise along with prescribed medications to achieve LDL 70mg , HDL >40mg .   Expected Outcomes Short Term: Participant states understanding of desired cholesterol values and is compliant with medications prescribed. Participant is following exercise prescription and nutrition guidelines.;Long Term: Cholesterol controlled with medications as prescribed, with individualized exercise RX and with personalized nutrition plan. Value goals: LDL < 70mg , HDL > 40 mg.   Stress Yes   Intervention Offer individual and/or small group education and counseling on adjustment to heart disease, stress management and health-related lifestyle change. Teach and support self-help strategies.;Refer participants experiencing significant psychosocial distress to appropriate mental health specialists for further evaluation and treatment. When possible, include family members and significant others in education/counseling sessions.   Expected Outcomes Short Term: Participant demonstrates changes in health-related behavior,  relaxation and other stress management skills, ability to obtain effective social support, and compliance with psychotropic medications if prescribed.;Long Term: Emotional wellbeing is indicated by absence of clinically significant psychosocial distress or social isolation.      Core Components/Risk Factors/Patient Goals Review:    Core Components/Risk Factors/Patient Goals at Discharge (Final Review):    ITP Comments:     ITP Comments    Row Name 06/02/16 1414           ITP Comments INitial ITP created  during medical review. diagnosis documentation can be found in [facility] encounter date 04/13/2016          Comments: Initial ITP

## 2016-06-11 ENCOUNTER — Encounter: Payer: Self-pay | Admitting: *Deleted

## 2016-06-11 ENCOUNTER — Encounter: Payer: BLUE CROSS/BLUE SHIELD | Admitting: *Deleted

## 2016-06-11 DIAGNOSIS — Z48812 Encounter for surgical aftercare following surgery on the circulatory system: Secondary | ICD-10-CM | POA: Diagnosis not present

## 2016-06-11 DIAGNOSIS — Z955 Presence of coronary angioplasty implant and graft: Secondary | ICD-10-CM

## 2016-06-11 DIAGNOSIS — I214 Non-ST elevation (NSTEMI) myocardial infarction: Secondary | ICD-10-CM

## 2016-06-11 NOTE — Progress Notes (Signed)
Daily Session Note  Patient Details  Name: Carlos Diaz MRN: [id number] Date of Birth: [birth year] Referring Provider:   Flowsheet Row Cardiac Rehab from 06/02/2016 in [facility] Cardiac and Pulmonary Rehab  Referring Provider  Neoma Laming MD      Encounter Date: 06/11/2016  Check In:     Session Check In - 06/11/16 1759      Check-In   Staff Present Nyoka Cowden, RN, BSN, MA;Albin Duckett, RN, BSN, CCRP;Carroll Enterkin, RN, BSN   Supervising physician immediately available to respond to emergencies See telemetry face sheet for immediately available ER MD   Medication changes reported     No   Fall or balance concerns reported    No   Warm-up and Cool-down Performed on first and last piece of equipment   Resistance Training Performed Yes   VAD Patient? No     Pain Assessment   Currently in Pain? No/denies         Goals Met:  Exercise tolerated well Personal goals reviewed No report of cardiac concerns or symptoms Strength training completed today  Goals Unmet:  Not Applicable  Comments: First full day of exercise! Carlos Diaz was oriented to gym and equipment including functions, settings, policies, and procedures.  Carlos Diaz's individual exercise prescription and treatment plan were reviewed.  All starting workloads were established based on the results of the 6 minute walk test done at initial orientation visit.  The plan for exercise progression was also introduced and progression will be customized based on patient's performance and goals.    Dr. Emily Filbert is Medical Director for [place] and [facility].

## 2016-06-11 NOTE — Progress Notes (Signed)
Cardiac Individual Treatment Plan  Patient Details  Name: Carlos Diaz MRN: [id number] Date of Birth: 05/19/1961 Referring Provider:   Flowsheet Row Cardiac Rehab from 06/02/2016 in [facility] Cardiac and Pulmonary Rehab  Referring Provider  Neoma Laming MD      Initial Encounter Date:  Flowsheet Row Cardiac Rehab from 06/02/2016 in [facility] Cardiac and Pulmonary Rehab  Date  06/02/16  Referring Provider  Neoma Laming MD      Visit Diagnosis: NSTEMI (non-ST elevated myocardial infarction) [facility])  Status post coronary artery stent placement  Patient's Home Medications on Admission:  Current Outpatient Prescriptions:  .  aspirin 81 MG chewable tablet, Chew 1 tablet (81 mg total) by mouth daily., Disp: 30 tablet, Rfl: 1 .  atorvastatin (LIPITOR) 40 MG tablet, Take 1 tablet (40 mg total) by mouth daily., Disp: 30 tablet, Rfl: 1 .  citalopram (CELEXA) 20 MG tablet, Take by mouth., Disp: , Rfl:  .  clonazePAM (KLONOPIN) 0.5 MG tablet, Take by mouth., Disp: , Rfl:  .  pantoprazole (PROTONIX) 40 MG tablet, Take by mouth., Disp: , Rfl:  .  ranolazine (RANEXA) 500 MG 12 hr tablet, Take by mouth., Disp: , Rfl:  .  sucralfate (CARAFATE) 1 g tablet, Take by mouth., Disp: , Rfl:  .  ticagrelor (BRILINTA) 90 MG TABS tablet, Take 1 tablet (90 mg total) by mouth 2 (two) times daily., Disp: 60 tablet, Rfl: 2  Past Medical History: Past Medical History:  Diagnosis Date  . Anxiety   . Hypertension   . Lyme disease     Tobacco Use: History  Smoking Status  . Never Smoker  Smokeless Tobacco  . Never Used    Labs: Recent Review Flowsheet Data    Labs for ITP Cardiac and Pulmonary Rehab Latest Ref Rng & Units 04/14/2016   Cholestrol 0 - 200 mg/dL 192   LDLCALC 0 - 99 mg/dL 126(H)   HDL >40 mg/dL 35(L)   Trlycerides <150 mg/dL 154(H)   Hemoglobin A1c 4.0 - 6.0 % 5.2       Exercise Target Goals:    Exercise Program Goal: Individual exercise prescription set with THRR, safety & activity  barriers. Participant demonstrates ability to understand and report RPE using BORG scale, to self-measure pulse accurately, and to acknowledge the importance of the exercise prescription.  Exercise Prescription Goal: Starting with aerobic activity 30 plus minutes a day, 3 days per week for initial exercise prescription. Provide home exercise prescription and guidelines that participant acknowledges understanding prior to discharge.  Activity Barriers & Risk Stratification:     Activity Barriers & Cardiac Risk Stratification - 06/02/16 1430      Activity Barriers & Cardiac Risk Stratification   Activity Barriers Joint Problems  Both hands with joint pain with gripping and use of hands. History of Lyme Disease.  Previously dislocated shoulder   Cardiac Risk Stratification High      6 Minute Walk:     6 Minute Walk    Row Name 06/02/16 1513         6 Minute Walk   Phase Initial     Distance 1900 feet     Walk Time 6 minutes     # of Rest Breaks 0     MPH 3.6     METS 4.53     RPE 11     VO2 Peak 15.84     Symptoms No     Resting HR 68 bpm     Resting BP 122/70  Max Ex. HR 108 bpm     Max Ex. BP 130/76     2 Minute Post BP 126/70        Initial Exercise Prescription:     Initial Exercise Prescription - 06/02/16 1500      Date of Initial Exercise RX and Referring Provider   Date 06/02/16   Referring Provider Neoma Laming MD     Treadmill   MPH 3.5   Grade 2   Minutes 15   METs 4.64     Elliptical   Level 2   Speed 4.6   Minutes 15     REL-XR   Level 4   Watts --  speed 50 rpm   Minutes 15   METs 4.5     Prescription Details   Frequency (times per week) 3   Duration Progress to 45 minutes of aerobic exercise without signs/symptoms of physical distress     Intensity   THRR 40-80% of Max Heartrate 106-144   Ratings of Perceived Exertion 11-13   Perceived Dyspnea 0-4     Progression   Progression Continue to progress workloads to maintain  intensity without signs/symptoms of physical distress.     Resistance Training   Training Prescription Yes   Weight 5 lbs   Reps 10-12      Perform Capillary Blood Glucose checks as needed.  Exercise Prescription Changes:     Exercise Prescription Changes    Row Name 06/02/16 1500             Exercise Review   Progression -  walk test results         Response to Exercise   Blood Pressure (Admit) 122/70       Blood Pressure (Exercise) 130/76       Blood Pressure (Exit) 126/70       Heart Rate (Admit) 68 bpm       Heart Rate (Exercise) 108 bpm       Heart Rate (Exit) 71 bpm       Oxygen Saturation (Admit) 96 %       Oxygen Saturation (Exit) 100 %       Rating of Perceived Exertion (Exercise) 11       Symptoms none          Exercise Comments:     Exercise Comments    Row Name 06/02/16 1520           Exercise Comments Exercise goal is to be able to return to his previous exercise routine with confidence (swimming and lifting)          Discharge Exercise Prescription (Final Exercise Prescription Changes):     Exercise Prescription Changes - 06/02/16 1500      Exercise Review   Progression --  walk test results     Response to Exercise   Blood Pressure (Admit) 122/70   Blood Pressure (Exercise) 130/76   Blood Pressure (Exit) 126/70   Heart Rate (Admit) 68 bpm   Heart Rate (Exercise) 108 bpm   Heart Rate (Exit) 71 bpm   Oxygen Saturation (Admit) 96 %   Oxygen Saturation (Exit) 100 %   Rating of Perceived Exertion (Exercise) 11   Symptoms none      Nutrition:  Target Goals: Understanding of nutrition guidelines, daily intake of sodium 1500mg , cholesterol 200mg , calories 30% from fat and 7% or less from saturated fats, daily to have 5 or more servings of fruits and vegetables.  Biometrics:  Pre Biometrics - 06/02/16 1522      Pre Biometrics   Height 6' 0.2" (1.834 m)   Weight 234 lb 8 oz (106.4 kg)   Waist Circumference 41 inches    Hip Circumference 41 inches   Waist to Hip Ratio 1 %   BMI (Calculated) 31.7   Single Leg Stand 30 seconds       Nutrition Therapy Plan and Nutrition Goals:     Nutrition Therapy & Goals - 06/02/16 1418      Intervention Plan   Intervention Prescribe, educate and counsel regarding individualized specific dietary modifications aiming towards targeted core components such as weight, hypertension, lipid management, diabetes, heart failure and other comorbidities.   Expected Outcomes Short Term Goal: Understand basic principles of dietary content, such as calories, fat, sodium, cholesterol and nutrients.;Short Term Goal: A plan has been developed with personal nutrition goals set during dietitian appointment.;Long Term Goal: Adherence to prescribed nutrition plan.      Nutrition Discharge: Rate Your Plate Scores:     Nutrition Assessments - 06/02/16 1418      Rate Your Plate Scores   Pre Score 67   Pre Score % 80 %      Nutrition Goals Re-Evaluation:   Psychosocial: Target Goals: Acknowledge presence or absence of depression, maximize coping skills, provide positive support system. Participant is able to verbalize types and ability to use techniques and skills needed for reducing stress and depression.  Initial Review & Psychosocial Screening:     Initial Psych Review & Screening - 06/02/16 1424      Initial Review   Current issues with Current Depression;Current Anxiety/Panic;Current Psychotropic Meds;Current Stress Concerns   Source of Stress Concerns Chronic Illness;Financial  Stress caused by chronic illness that went years before diagnosed.  treatment expensive and traveled all over the [facility] to find a physician that could diagnose the Lyme Diseaset hat was causing all of his symptoms.    Comments Used savings up that was planned for early retirement.  Wife has same diagnosis of Lyme Disease and again spemt money for treatment and MD visits.     Family Dynamics    Good Support System? Yes   Concerns Recent loss of child   Comments Son killed last year,hit by vehicle. This weighs daily on [place]     Barriers   Psychosocial barriers to participate in program There are no identifiable barriers or psychosocial needs.     Screening Interventions   Interventions Encouraged to exercise      Quality of Life Scores:     Quality of Life - 06/02/16 1424      Quality of Life Scores   Health/Function Pre 20.87 %   Socioeconomic Pre 20.5 %   Psych/Spiritual Pre 20.71 %   Family Pre 21 %   GLOBAL Pre 20.77 %      PHQ-9: Recent Review Flowsheet Data    Depression screen [facility] 2/9 06/02/2016   Decreased Interest 2   Down, Depressed, Hopeless 1   PHQ - 2 Score 3   Altered sleeping 1   Tired, decreased energy 0   Change in appetite 0   Feeling bad or failure about yourself  2   Trouble concentrating 1   Moving slowly or fidgety/restless 0   Suicidal thoughts 0   PHQ-9 Score 7   Difficult doing work/chores Not difficult at all      Psychosocial Evaluation and Intervention:   Psychosocial Re-Evaluation:   Vocational Rehabilitation: Provide vocational  rehab assistance to qualifying candidates.   Vocational Rehab Evaluation & Intervention:     Vocational Rehab - 06/02/16 1431      Initial Vocational Rehab Evaluation & Intervention   Assessment shows need for Vocational Rehabilitation No      Education: Education Goals: Education classes will be provided on a weekly basis, covering required topics. Participant will state understanding/return demonstration of topics presented.  Learning Barriers/Preferences:     Learning Barriers/Preferences - 06/02/16 1431      Learning Barriers/Preferences   Learning Barriers None   Learning Preferences Audio;Individual Instruction;Verbal Instruction      Education Topics: General Nutrition Guidelines/Fats and Fiber: -Group instruction provided by verbal, written material, models and posters  to present the general guidelines for heart healthy nutrition. Gives an explanation and review of dietary fats and fiber.   Controlling Sodium/Reading Food Labels: -Group verbal and written material supporting the discussion of sodium use in heart healthy nutrition. Review and explanation with models, verbal and written materials for utilization of the food label.   Exercise Physiology & Risk Factors: - Group verbal and written instruction with models to review the exercise physiology of the cardiovascular system and associated critical values. Details cardiovascular disease risk factors and the goals associated with each risk factor.   Aerobic Exercise & Resistance Training: - Gives group verbal and written discussion on the health impact of inactivity. On the components of aerobic and resistive training programs and the benefits of this training and how to safely progress through these programs.   Flexibility, Balance, General Exercise Guidelines: - Provides group verbal and written instruction on the benefits of flexibility and balance training programs. Provides general exercise guidelines with specific guidelines to those with heart or lung disease. Demonstration and skill practice provided.   Stress Management: - Provides group verbal and written instruction about the health risks of elevated stress, cause of high stress, and healthy ways to reduce stress.   Depression: - Provides group verbal and written instruction on the correlation between heart/lung disease and depressed mood, treatment options, and the stigmas associated with seeking treatment.   Anatomy & Physiology of the Heart: - Group verbal and written instruction and models provide basic cardiac anatomy and physiology, with the coronary electrical and arterial systems. Review of: AMI, Angina, Valve disease, Heart Failure, Cardiac Arrhythmia, Pacemakers, and the ICD.   Cardiac Procedures: - Group verbal and written  instruction and models to describe the testing methods done to diagnose heart disease. Reviews the outcomes of the test results. Describes the treatment choices: Medical Management, Angioplasty, or Coronary Bypass Surgery.   Cardiac Medications: - Group verbal and written instruction to review commonly prescribed medications for heart disease. Reviews the medication, class of the drug, and side effects. Includes the steps to properly store meds and maintain the prescription regimen.   Go Sex-Intimacy & Heart Disease, Get SMART - Goal Setting: - Group verbal and written instruction through game format to discuss heart disease and the return to sexual intimacy. Provides group verbal and written material to discuss and apply goal setting through the application of the S.M.A.R.T. Method.   Other Matters of the Heart: - Provides group verbal, written materials and models to describe Heart Failure, Angina, Valve Disease, and Diabetes in the realm of heart disease. Includes description of the disease process and treatment options available to the cardiac patient.   Exercise & Equipment Safety: - Individual verbal instruction and demonstration of equipment use and safety with use of the equipment.  Flowsheet Row Cardiac Rehab from 06/02/2016 in [facility] Cardiac and Pulmonary Rehab  Date  06/02/16  Educator  SB  Instruction Review Code  2- meets goals/outcomes      Infection Prevention: - Provides verbal and written material to individual with discussion of infection control including proper hand washing and proper equipment cleaning during exercise session. Flowsheet Row Cardiac Rehab from 06/02/2016 in [facility] Cardiac and Pulmonary Rehab  Date  06/02/16  Educator  SB  Instruction Review Code  2- meets goals/outcomes      Falls Prevention: - Provides verbal and written material to individual with discussion of falls prevention and safety.   Diabetes: - Individual verbal and written instruction to  review signs/symptoms of diabetes, desired ranges of glucose level fasting, after meals and with exercise. Advice that pre and post exercise glucose checks will be done for 3 sessions at entry of program.    Knowledge Questionnaire Score:     Knowledge Questionnaire Score - 06/02/16 1431      Knowledge Questionnaire Score   Pre Score 25/28      Core Components/Risk Factors/Patient Goals at Admission:     Personal Goals and Risk Factors at Admission - 06/02/16 1431      Core Components/Risk Factors/Patient Goals on Admission    Weight Management Yes;Weight Maintenance   Intervention Weight Management: Develop a combined nutrition and exercise program designed to reach desired caloric intake, while maintaining appropriate intake of nutrient and fiber, sodium and fats, and appropriate energy expenditure required for the weight goal.;Weight Management: Provide education and appropriate resources to help participant work on and attain dietary goals.   Expected Outcomes Weight Maintenance: Understanding of the daily nutrition guidelines, which includes 25-35% calories from fat, 7% or less cal from saturated fats, less than 200mg  cholesterol, less than 1.5gm of sodium, & 5 or more servings of fruits and vegetables daily   Increase Strength and Stamina Yes   Intervention Provide advice, education, support and counseling about physical activity/exercise needs.   Expected Outcomes Achievement of increased cardiorespiratory fitness and enhanced flexibility, muscular endurance and strength shown through measurements of functional capacity and personal statement of participant.   Hypertension Yes   Intervention Provide education on lifestyle modifcations including regular physical activity/exercise, weight management, moderate sodium restriction and increased consumption of fresh fruit, vegetables, and low fat dairy, alcohol moderation, and smoking cessation.;Monitor prescription use compliance.    Expected Outcomes Short Term: Continued assessment and intervention until BP is < 140/60mm HG in hypertensive participants. < 130/33mm HG in hypertensive participants with diabetes, heart failure or chronic kidney disease.;Long Term: Maintenance of blood pressure at goal levels.   Lipids Yes   Intervention Provide education and support for participant on nutrition & aerobic/resistive exercise along with prescribed medications to achieve LDL 70mg , HDL >40mg .   Expected Outcomes Short Term: Participant states understanding of desired cholesterol values and is compliant with medications prescribed. Participant is following exercise prescription and nutrition guidelines.;Long Term: Cholesterol controlled with medications as prescribed, with individualized exercise RX and with personalized nutrition plan. Value goals: LDL < 70mg , HDL > 40 mg.   Stress Yes   Intervention Offer individual and/or small group education and counseling on adjustment to heart disease, stress management and health-related lifestyle change. Teach and support self-help strategies.;Refer participants experiencing significant psychosocial distress to appropriate mental health specialists for further evaluation and treatment. When possible, include family members and significant others in education/counseling sessions.   Expected Outcomes Short Term: Participant demonstrates changes in health-related behavior,  relaxation and other stress management skills, ability to obtain effective social support, and compliance with psychotropic medications if prescribed.;Long Term: Emotional wellbeing is indicated by absence of clinically significant psychosocial distress or social isolation.      Core Components/Risk Factors/Patient Goals Review:    Core Components/Risk Factors/Patient Goals at Discharge (Final Review):    ITP Comments:     ITP Comments    Row Name 06/02/16 1414 06/11/16 1116         ITP Comments INitial ITP created  during medical review. diagnosis documentation can be found in [facility] encounter date 04/13/2016 30 day review. Continue with ITP unless changes noted by Medical Director at signature of review.  New to program         Comments:

## 2016-06-12 DIAGNOSIS — Z48812 Encounter for surgical aftercare following surgery on the circulatory system: Secondary | ICD-10-CM | POA: Diagnosis not present

## 2016-06-12 DIAGNOSIS — Z955 Presence of coronary angioplasty implant and graft: Secondary | ICD-10-CM

## 2016-06-12 DIAGNOSIS — I214 Non-ST elevation (NSTEMI) myocardial infarction: Secondary | ICD-10-CM

## 2016-06-12 NOTE — Progress Notes (Signed)
Daily Session Note  Patient Details  Name: Spenser Harren MRN: [id number] Date of Birth: [birth year] Referring Provider:   Flowsheet Row Cardiac Rehab from 06/02/2016 in [facility] Cardiac and Pulmonary Rehab  Referring Provider  Neoma Laming MD      Encounter Date: 06/12/2016  Check In:     Session Check In - 06/12/16 1705      Check-In   Location [facility]-Cardiac & Pulmonary Rehab   Staff Present Jeanell Sparrow, DPT, Burlene Arnt, BA, ACSM CEP, Exercise Physiologist;Other   Supervising physician immediately available to respond to emergencies See telemetry face sheet for immediately available ER MD   Medication changes reported     No   Fall or balance concerns reported    No   Warm-up and Cool-down Performed on first and last piece of equipment   Resistance Training Performed Yes   VAD Patient? No     Pain Assessment   Currently in Pain? No/denies         Goals Met:  Independence with exercise equipment  Goals Unmet:  Not Applicable  Comments: Patient completed exercise prescription and all exercise goals during rehab session. The exercise was tolerated well and the patient is progressing in the program.    Dr. Emily Filbert is Medical Director for [place] and [facility].

## 2016-06-18 DIAGNOSIS — I214 Non-ST elevation (NSTEMI) myocardial infarction: Secondary | ICD-10-CM

## 2016-06-18 DIAGNOSIS — Z48812 Encounter for surgical aftercare following surgery on the circulatory system: Secondary | ICD-10-CM | POA: Diagnosis not present

## 2016-06-18 DIAGNOSIS — Z955 Presence of coronary angioplasty implant and graft: Secondary | ICD-10-CM

## 2016-06-18 NOTE — Progress Notes (Signed)
Daily Session Note  Patient Details  Name: Carlos Diaz MRN: [id number] Date of Birth: 1961/01/18 Referring Provider:   Flowsheet Row Cardiac Rehab from 06/02/2016 in [facility] Cardiac and Pulmonary Rehab  Referring Provider  Neoma Laming MD      Encounter Date: 06/18/2016  Check In:     Session Check In - 06/18/16 1631      Check-In   Location [facility]-Cardiac & Pulmonary Rehab   Staff Present Heath Lark, RN, BSN, CCRP;Carroll Enterkin, RN, Vickki Hearing, BA, ACSM CEP, Exercise Physiologist   Supervising physician immediately available to respond to emergencies See telemetry face sheet for immediately available ER MD   Medication changes reported     No   Fall or balance concerns reported    No   Warm-up and Cool-down Performed on first and last piece of equipment   Resistance Training Performed Yes   VAD Patient? No     Pain Assessment   Currently in Pain? No/denies   Multiple Pain Sites No         Goals Met:  Independence with exercise equipment Exercise tolerated well No report of cardiac concerns or symptoms Strength training completed today  Goals Unmet:  Not Applicable  Comments: Pt able to follow exercise prescription today without complaint.  Will continue to monitor for progression.    Dr. Emily Filbert is Medical Director for [name] and [facility].

## 2016-06-19 ENCOUNTER — Encounter: Payer: BLUE CROSS/BLUE SHIELD | Admitting: *Deleted

## 2016-06-19 DIAGNOSIS — I214 Non-ST elevation (NSTEMI) myocardial infarction: Secondary | ICD-10-CM

## 2016-06-19 DIAGNOSIS — Z955 Presence of coronary angioplasty implant and graft: Secondary | ICD-10-CM

## 2016-06-19 DIAGNOSIS — Z48812 Encounter for surgical aftercare following surgery on the circulatory system: Secondary | ICD-10-CM | POA: Diagnosis not present

## 2016-06-19 NOTE — Progress Notes (Signed)
Daily Session Note  Patient Details  Name: Carlos Diaz MRN: [id number] Date of Birth: [birth year] Referring Provider:   Flowsheet Row Cardiac Rehab from 06/02/2016 in [facility] Cardiac and Pulmonary Rehab  Referring Provider  Neoma Laming MD      Encounter Date: 06/19/2016  Check In:     Session Check In - 06/19/16 1708      Check-In   Location [facility]-Cardiac & Pulmonary Rehab   Staff Present Gerlene Burdock, RN, Moises Blood, BS, ACSM CEP, Exercise Physiologist;Amanda Oletta Darter, [place], ACSM CEP, Exercise Physiologist   Supervising physician immediately available to respond to emergencies See telemetry face sheet for immediately available ER MD   Medication changes reported     No   Fall or balance concerns reported    No   Warm-up and Cool-down Performed on first and last piece of equipment   Resistance Training Performed Yes   VAD Patient? No     Pain Assessment   Currently in Pain? No/denies   Multiple Pain Sites No           Exercise Prescription Changes - 06/19/16 1100      Exercise Review   Progression Yes     Response to Exercise   Blood Pressure (Admit) 122/78   Blood Pressure (Exercise) 172/86   Blood Pressure (Exit) 140/70   Heart Rate (Admit) 70 bpm   Heart Rate (Exercise) 116 bpm   Heart Rate (Exit) 78 bpm   Symptoms none     Progression   Progression Continue to progress workloads to maintain intensity without signs/symptoms of physical distress.   Average METs 4.23     Resistance Training   Training Prescription Yes   Weight 4   Reps 10-12     Interval Training   Interval Training No     Treadmill   MPH 3.5   Grade 2   Minutes 15   METs 4.65     REL-XR   Level 4   Minutes 15   METs 3.8      Goals Met:  Independence with exercise equipment Exercise tolerated well No report of cardiac concerns or symptoms Strength training completed today  Goals Unmet:  Not Applicable  Comments: Pt able to follow exercise prescription today  without complaint.  Will continue to monitor for progression.    Dr. Emily Filbert is Medical Director for [place] and [facility].

## 2016-06-23 ENCOUNTER — Encounter: Payer: BLUE CROSS/BLUE SHIELD | Admitting: *Deleted

## 2016-06-23 DIAGNOSIS — Z955 Presence of coronary angioplasty implant and graft: Secondary | ICD-10-CM

## 2016-06-23 DIAGNOSIS — I214 Non-ST elevation (NSTEMI) myocardial infarction: Secondary | ICD-10-CM

## 2016-06-23 DIAGNOSIS — Z48812 Encounter for surgical aftercare following surgery on the circulatory system: Secondary | ICD-10-CM | POA: Diagnosis not present

## 2016-06-23 NOTE — Progress Notes (Signed)
Daily Session Note  Patient Details  Name: Carlos Diaz MRN: [id number] Date of Birth: 1960/12/29 Referring Provider:   Flowsheet Row Cardiac Rehab from 06/02/2016 in [facility] Cardiac and Pulmonary Rehab  Referring Provider  Neoma Laming MD      Encounter Date: 06/23/2016  Check In:     Session Check In - 06/23/16 1624      Check-In   Location [facility]-Cardiac & Pulmonary Rehab   Staff Present Gerlene Burdock, RN, Moises Blood, BS, ACSM CEP, Exercise Physiologist;Mary Kellie Shropshire, RN, BSN, MA   Supervising physician immediately available to respond to emergencies See telemetry face sheet for immediately available ER MD   Medication changes reported     No   Fall or balance concerns reported    No   Warm-up and Cool-down Performed on first and last piece of equipment   Resistance Training Performed Yes   VAD Patient? No     Pain Assessment   Currently in Pain? No/denies   Multiple Pain Sites No         Goals Met:  Independence with exercise equipment Exercise tolerated well No report of cardiac concerns or symptoms Strength training completed today  Goals Unmet:  Not Applicable  Comments: Pt able to follow exercise prescription today without complaint.  Will continue to monitor for progression.    Dr. Emily Filbert is Medical Director for [place] and [facility].

## 2016-06-23 NOTE — Addendum Note (Signed)
Addended by: Gerlene Burdock on: 06/23/2016 06:07 PM   Modules accepted: Miquel Dunn

## 2016-06-23 NOTE — Progress Notes (Signed)
Incomplete Session Note  Patient Details  Name: Carlos Diaz MRN: [id number] Date of Birth: 07/16/1961 Referring Provider:   Flowsheet Row Cardiac Rehab from 06/02/2016 in [facility] Cardiac and Pulmonary Rehab  Referring Provider  Neoma Laming MD      [name] did not complete his rehab session.  [name] Mental health Counselor met with [name] during Education session and during exercise time. [name]'s 39 year old son died last year in a car crash. [name] said his talk with [name] helped him a lot since he needs to vent to someone. [name] 's blood pressure after he talked to [name] was good.

## 2016-06-23 NOTE — Progress Notes (Signed)
This encounter was created in error - please disregard.

## 2016-06-25 ENCOUNTER — Encounter: Payer: BLUE CROSS/BLUE SHIELD | Admitting: *Deleted

## 2016-06-25 DIAGNOSIS — I214 Non-ST elevation (NSTEMI) myocardial infarction: Secondary | ICD-10-CM

## 2016-06-25 DIAGNOSIS — Z48812 Encounter for surgical aftercare following surgery on the circulatory system: Secondary | ICD-10-CM | POA: Diagnosis not present

## 2016-06-25 NOTE — Progress Notes (Signed)
Daily Session Note  Patient Details  Name: Carlos Diaz MRN: [id number] Date of Birth: 25-Nov-1960 Referring Provider:   Flowsheet Row Cardiac Rehab from 06/02/2016 in [facility] Cardiac and Pulmonary Rehab  Referring Provider  Neoma Laming MD      Encounter Date: 06/25/2016  Check In:     Session Check In - 06/25/16 1655      Check-In   Location [facility]-Cardiac & Pulmonary Rehab   Staff Present Gerlene Burdock, RN, BSN;Susanne Bice, RN, BSN, Lance Sell, BA, ACSM CEP, Exercise Physiologist   Supervising physician immediately available to respond to emergencies See telemetry face sheet for immediately available ER MD   Medication changes reported     No   Fall or balance concerns reported    No   Warm-up and Cool-down Performed on first and last piece of equipment   VAD Patient? No     Pain Assessment   Currently in Pain? No/denies         Goals Met:  Proper associated with RPD/PD & O2 Sat Exercise tolerated well No report of cardiac concerns or symptoms  Goals Unmet:  Not Applicable  Comments:     Dr. Emily Filbert is Medical Director for [place] and [facility].

## 2016-06-26 ENCOUNTER — Encounter: Payer: BLUE CROSS/BLUE SHIELD | Admitting: *Deleted

## 2016-06-26 DIAGNOSIS — I214 Non-ST elevation (NSTEMI) myocardial infarction: Secondary | ICD-10-CM

## 2016-06-26 DIAGNOSIS — Z955 Presence of coronary angioplasty implant and graft: Secondary | ICD-10-CM

## 2016-06-26 DIAGNOSIS — Z48812 Encounter for surgical aftercare following surgery on the circulatory system: Secondary | ICD-10-CM | POA: Diagnosis not present

## 2016-06-26 NOTE — Progress Notes (Signed)
Daily Session Note  Patient Details  Name: Carlos Diaz MRN: [id number] Date of Birth: July 16, 1961 Referring Provider:   Flowsheet Row Cardiac Rehab from 06/02/2016 in [facility] Cardiac and Pulmonary Rehab  Referring Provider  Neoma Laming MD      Encounter Date: 06/26/2016  Check In:     Session Check In - 06/26/16 1729      Check-In   Location [facility]-Cardiac & Pulmonary Rehab   Staff Present Gerlene Burdock, RN, Moises Blood, BS, ACSM CEP, Exercise Physiologist;Amanda Oletta Darter, [place], ACSM CEP, Exercise Physiologist   Supervising physician immediately available to respond to emergencies See telemetry face sheet for immediately available ER MD   Medication changes reported     No   Fall or balance concerns reported    No   Warm-up and Cool-down Performed on first and last piece of equipment   Resistance Training Performed Yes   VAD Patient? No     Pain Assessment   Currently in Pain? No/denies   Multiple Pain Sites No         Goals Met:  Independence with exercise equipment Exercise tolerated well No report of cardiac concerns or symptoms Strength training completed today  Goals Unmet:  Not Applicable  Comments: Pt able to follow exercise prescription today without complaint.  Will continue to monitor for progression.    Dr. Emily Filbert is Medical Director for [place] and [facility].

## 2016-06-30 ENCOUNTER — Encounter: Payer: BLUE CROSS/BLUE SHIELD | Admitting: *Deleted

## 2016-06-30 DIAGNOSIS — I214 Non-ST elevation (NSTEMI) myocardial infarction: Secondary | ICD-10-CM

## 2016-06-30 DIAGNOSIS — Z955 Presence of coronary angioplasty implant and graft: Secondary | ICD-10-CM

## 2016-06-30 DIAGNOSIS — Z48812 Encounter for surgical aftercare following surgery on the circulatory system: Secondary | ICD-10-CM | POA: Diagnosis not present

## 2016-06-30 NOTE — Progress Notes (Signed)
Daily Session Note  Patient Details  Name: Carlos Diaz MRN: [id number] Date of Birth: 1960/09/12 Referring Provider:   Flowsheet Row Cardiac Rehab from 06/02/2016 in [facility] Cardiac and Pulmonary Rehab  Referring Provider  Neoma Laming MD      Encounter Date: 06/30/2016  Check In:     Session Check In - 06/30/16 1800      Check-In   Staff Present Heath Lark, RN, BSN, CCRP;Carroll Enterkin, RN, Moises Blood, BS, ACSM CEP, Exercise Physiologist   Supervising physician immediately available to respond to emergencies See telemetry face sheet for immediately available ER MD   Medication changes reported     No   Fall or balance concerns reported    No   Warm-up and Cool-down Performed on first and last piece of equipment   Resistance Training Performed Yes     Pain Assessment   Currently in Pain? No/denies         Goals Met:  Independence with exercise equipment Exercise tolerated well No report of cardiac concerns or symptoms Strength training completed today  Goals Unmet:  Not Applicable  Comments: Doing well with exercise prescription progression.    Dr. Emily Filbert is Medical Director for [place] and [facility].

## 2016-07-02 ENCOUNTER — Encounter: Payer: BLUE CROSS/BLUE SHIELD | Attending: Cardiovascular Disease

## 2016-07-02 DIAGNOSIS — Z955 Presence of coronary angioplasty implant and graft: Secondary | ICD-10-CM | POA: Insufficient documentation

## 2016-07-02 DIAGNOSIS — Z9889 Other specified postprocedural states: Secondary | ICD-10-CM | POA: Insufficient documentation

## 2016-07-02 DIAGNOSIS — I214 Non-ST elevation (NSTEMI) myocardial infarction: Secondary | ICD-10-CM | POA: Insufficient documentation

## 2016-07-02 DIAGNOSIS — Z48812 Encounter for surgical aftercare following surgery on the circulatory system: Secondary | ICD-10-CM | POA: Insufficient documentation

## 2016-07-09 ENCOUNTER — Encounter: Payer: Self-pay | Admitting: *Deleted

## 2016-07-09 DIAGNOSIS — Z955 Presence of coronary angioplasty implant and graft: Secondary | ICD-10-CM

## 2016-07-09 DIAGNOSIS — I214 Non-ST elevation (NSTEMI) myocardial infarction: Secondary | ICD-10-CM

## 2016-07-09 NOTE — Progress Notes (Signed)
Cardiac Individual Treatment Plan  Patient Details  Name: Carlos Diaz MRN: [id number] Date of Birth: [birth year] Referring Provider:   Flowsheet Row Cardiac Rehab from 06/02/2016 in [facility] Cardiac and Pulmonary Rehab  Referring Provider  Carlos Laming MD      Initial Encounter Date:  Flowsheet Row Cardiac Rehab from 06/02/2016 in [facility] Cardiac and Pulmonary Rehab  Date  06/02/16  Referring Provider  Carlos Laming MD      Visit Diagnosis: NSTEMI (non-ST elevated myocardial infarction) [facility])  Status post coronary artery stent placement  Patient's Home Medications on Admission:  Current Outpatient Prescriptions:  .  aspirin 81 MG chewable tablet, Chew 1 tablet (81 mg total) by mouth daily., Disp: 30 tablet, Rfl: 1 .  atorvastatin (LIPITOR) 40 MG tablet, Take 1 tablet (40 mg total) by mouth daily., Disp: 30 tablet, Rfl: 1 .  citalopram (CELEXA) 20 MG tablet, Take by mouth., Disp: , Rfl:  .  clonazePAM (KLONOPIN) 0.5 MG tablet, Take by mouth., Disp: , Rfl:  .  pantoprazole (PROTONIX) 40 MG tablet, Take by mouth., Disp: , Rfl:  .  ranolazine (RANEXA) 500 MG 12 hr tablet, Take by mouth., Disp: , Rfl:  .  sucralfate (CARAFATE) 1 g tablet, Take by mouth., Disp: , Rfl:  .  ticagrelor (BRILINTA) 90 MG TABS tablet, Take 1 tablet (90 mg total) by mouth 2 (two) times daily., Disp: 60 tablet, Rfl: 2  Past Medical History: Past Medical History:  Diagnosis Date  . Anxiety   . Hypertension   . Lyme disease     Tobacco Use: History  Smoking Status  . Never Smoker  Smokeless Tobacco  . Never Used    Labs: Recent Review Flowsheet Data    Labs for ITP Cardiac and Pulmonary Rehab Latest Ref Rng & Units 04/14/2016   Cholestrol 0 - 200 mg/dL 192   LDLCALC 0 - 99 mg/dL 126(H)   HDL >40 mg/dL 35(L)   Trlycerides <150 mg/dL 154(H)   Hemoglobin A1c 4.0 - 6.0 % 5.2       Exercise Target Goals:    Exercise Program Goal: Individual exercise prescription set with THRR, safety & activity  barriers. Participant demonstrates ability to understand and report RPE using BORG scale, to self-measure pulse accurately, and to acknowledge the importance of the exercise prescription.  Exercise Prescription Goal: Starting with aerobic activity 30 plus minutes a day, 3 days per week for initial exercise prescription. Provide home exercise prescription and guidelines that participant acknowledges understanding prior to discharge.  Activity Barriers & Risk Stratification:     Activity Barriers & Cardiac Risk Stratification - 06/02/16 1430      Activity Barriers & Cardiac Risk Stratification   Activity Barriers Joint Problems  Both hands with joint pain with gripping and use of hands. History of Lyme Disease.  Previously dislocated shoulder   Cardiac Risk Stratification High      6 Minute Walk:     6 Minute Walk    Row Name 06/02/16 1513         6 Minute Walk   Phase Initial     Distance 1900 feet     Walk Time 6 minutes     # of Rest Breaks 0     MPH 3.6     METS 4.53     RPE 11     VO2 Peak 15.84     Symptoms No     Resting HR 68 bpm     Resting BP 122/70  Max Ex. HR 108 bpm     Max Ex. BP 130/76     2 Minute Post BP 126/70        Initial Exercise Prescription:     Initial Exercise Prescription - 06/02/16 1500      Date of Initial Exercise RX and Referring Provider   Date 06/02/16   Referring Provider Carlos Laming MD     Treadmill   MPH 3.5   Grade 2   Minutes 15   METs 4.64     Elliptical   Level 2   Speed 4.6   Minutes 15     REL-XR   Level 4   Watts --  speed 50 rpm   Minutes 15   METs 4.5     Prescription Details   Frequency (times per week) 3   Duration Progress to 45 minutes of aerobic exercise without signs/symptoms of physical distress     Intensity   THRR 40-80% of Max Heartrate 106-144   Ratings of Perceived Exertion 11-13   Perceived Dyspnea 0-4     Progression   Progression Continue to progress workloads to maintain  intensity without signs/symptoms of physical distress.     Resistance Training   Training Prescription Yes   Weight 5 lbs   Reps 10-12      Perform Capillary Blood Glucose checks as needed.  Exercise Prescription Changes:     Exercise Prescription Changes    Row Name 06/02/16 1500 06/19/16 1100 07/02/16 1200         Exercise Review   Progression -  walk test results Yes Yes       Response to Exercise   Blood Pressure (Admit) 122/70 122/78 124/72     Blood Pressure (Exercise) 130/76 172/86 150/84     Blood Pressure (Exit) 126/70 140/70 138/80     Heart Rate (Admit) 68 bpm 70 bpm 68 bpm     Heart Rate (Exercise) 108 bpm 116 bpm 118 bpm     Heart Rate (Exit) 71 bpm 78 bpm 78 bpm     Oxygen Saturation (Admit) 96 %  -  -     Oxygen Saturation (Exit) 100 %  -  -     Rating of Perceived Exertion (Exercise) 11  -  -     Symptoms none none none       Progression   Progression  - Continue to progress workloads to maintain intensity without signs/symptoms of physical distress. Continue to progress workloads to maintain intensity without signs/symptoms of physical distress.     Average METs  - 4.23 4.65       Resistance Training   Training Prescription  - Yes Yes     Weight  - 4 4     Reps  - 10-12 10-12       Interval Training   Interval Training  - No No       Treadmill   MPH  - 3.5 3.5     Grade  - 2 2     Minutes  - 15 15     METs  - 4.65 4.65       Elliptical   Level  -  - 2     Speed  -  - 4.6     Minutes  -  - 15       REL-XR   Level  - 4  -     Minutes  - 15  -  METs  - 3.8  -        Exercise Comments:     Exercise Comments    Row Name 06/02/16 1520 06/11/16 1800 06/19/16 1121 07/02/16 1243     Exercise Comments Exercise goal is to be able to return to his previous exercise routine with confidence (swimming and lifting) First full day of exercise! Carlos Diaz was oriented to gym and equipment including functions, settings, policies, and procedures.   Carlos Diaz's individual exercise prescription and treatment plan were reviewed.  All starting workloads were established based on the results of the 6 minute walk test done at initial orientation visit.  The plan for exercise progression was also introduced and progression will be customized based on patient's performance and goals. Vinicio is progressing well with exercise. Kaydyn continues to progress well with exercise.       Discharge Exercise Prescription (Final Exercise Prescription Changes):     Exercise Prescription Changes - 07/02/16 1200      Exercise Review   Progression Yes     Response to Exercise   Blood Pressure (Admit) 124/72   Blood Pressure (Exercise) 150/84   Blood Pressure (Exit) 138/80   Heart Rate (Admit) 68 bpm   Heart Rate (Exercise) 118 bpm   Heart Rate (Exit) 78 bpm   Symptoms none     Progression   Progression Continue to progress workloads to maintain intensity without signs/symptoms of physical distress.   Average METs 4.65     Resistance Training   Training Prescription Yes   Weight 4   Reps 10-12     Interval Training   Interval Training No     Treadmill   MPH 3.5   Grade 2   Minutes 15   METs 4.65     Elliptical   Level 2   Speed 4.6   Minutes 15      Nutrition:  Target Goals: Understanding of nutrition guidelines, daily intake of sodium <1563m, cholesterol <2086m calories 30% from fat and 7% or less from saturated fats, daily to have 5 or more servings of fruits and vegetables.  Biometrics:     Pre Biometrics - 06/02/16 1522      Pre Biometrics   Height 6' 0.2" (1.834 m)   Weight 234 lb 8 oz (106.4 kg)   Waist Circumference 41 inches   Hip Circumference 41 inches   Waist to Hip Ratio 1 %   BMI (Calculated) 31.7   Single Leg Stand 30 seconds       Nutrition Therapy Plan and Nutrition Goals:     Nutrition Therapy & Goals - 06/02/16 1418      Intervention Plan   Intervention Prescribe, educate and counsel regarding  individualized specific dietary modifications aiming towards targeted core components such as weight, hypertension, lipid management, diabetes, heart failure and other comorbidities.   Expected Outcomes Short Term Goal: Understand basic principles of dietary content, such as calories, fat, sodium, cholesterol and nutrients.;Short Term Goal: A plan has been developed with personal nutrition goals set during dietitian appointment.;Long Term Goal: Adherence to prescribed nutrition plan.      Nutrition Discharge: Rate Your Plate Scores:     Nutrition Assessments - 06/02/16 1418      Rate Your Plate Scores   Pre Score 67   Pre Score % 80 %      Nutrition Goals Re-Evaluation:   Psychosocial: Target Goals: Acknowledge presence or absence of depression, maximize coping skills, provide positive support system. Participant is able to  verbalize types and ability to use techniques and skills needed for reducing stress and depression.  Initial Review & Psychosocial Screening:     Initial Psych Review & Screening - 06/02/16 1424      Initial Review   Current issues with Current Depression;Current Anxiety/Panic;Current Psychotropic Meds;Current Stress Concerns   Source of Stress Concerns Chronic Illness;Financial  Stress caused by chronic illness that went years before diagnosed.  treatment expensive and traveled all over the [facility] to find a physician that could diagnose the Lyme Diseaset hat was causing all of his symptoms.    Comments Used savings up that was planned for early retirement.  Wife has same diagnosis of Lyme Disease and again spemt money for treatment and MD visits.     Family Dynamics   Good Support System? Yes   Concerns Recent loss of child   Comments Son killed last year,hit by vehicle. This weighs daily on Jerelle     Barriers   Psychosocial barriers to participate in program There are no identifiable barriers or psychosocial needs.     Screening Interventions    Interventions Encouraged to exercise      Quality of Life Scores:     Quality of Life - 06/02/16 1424      Quality of Life Scores   Health/Function Pre 20.87 %   Socioeconomic Pre 20.5 %   Psych/Spiritual Pre 20.71 %   Family Pre 21 %   GLOBAL Pre 20.77 %      PHQ-9: Recent Review Flowsheet Data    Depression screen [facility] 2/9 06/02/2016   Decreased Interest 2   Down, Depressed, Hopeless 1   PHQ - 2 Score 3   Altered sleeping 1   Tired, decreased energy 0   Change in appetite 0   Feeling bad or failure about yourself  2   Trouble concentrating 1   Moving slowly or fidgety/restless 0   Suicidal thoughts 0   PHQ-9 Score 7   Difficult doing work/chores Not difficult at all      Psychosocial Evaluation and Intervention:     Psychosocial Evaluation - 06/23/16 1735      Psychosocial Evaluation & Interventions   Interventions Relaxation education;Encouraged to exercise with the program and follow exercise prescription;Stress management education   Comments Counselor met with Mr. Age (Mr. D) today for initial psychosocial evaluation.  He is a 55 year old who had a heart attack in August.  He has a strong support system with a spouse of 21 years; good friends; an adult daughter close by; and active involvement in his local church.  Mr. D has struggled with Lyme's disease for many years and still has some impact in his physical health.  He does not sleep well; which he describes has been ongoing most of his life; but he does get about 6 hours per night most nights.  Encouraged speaking with Dr. or pharmacist about something natural to help with this.  He reports a history of depression and anxiety since the Lyme's diagnosis in 2001 and is on medications that help with this currently.  Mr. D has had multiple losses in the past two years as he had a son die in an accident while on vacation;; a mother died a few weeks before that; and he has a conflictual relationship with his brother  in [place].  Mr. Lenna Sciara reports coping with all of this with the help of a counselor; work; friends and family and he works out regularly.  He reports his mood is typically positive most of the time although he does feel anxious occasionally.  Counselor taught him deep breathing activities and practiced with him; as well as some visualization.  Counselor also encouraged trauma focused therapy at some point once he completes treatment with his current therapist.  Mr. D has goals to get back to normal activities in his life, without being afraid of working out.  Staff will continue to follow with Mr. D throughout the course of this program.     Continued Psychosocial Services Needed Yes  Stress management; relaxation; and sleep needs to be monitored and treated.      Psychosocial Re-Evaluation:   Vocational Rehabilitation: Provide vocational rehab assistance to qualifying candidates.   Vocational Rehab Evaluation & Intervention:     Vocational Rehab - 06/02/16 1431      Initial Vocational Rehab Evaluation & Intervention   Assessment shows need for Vocational Rehabilitation No      Education: Education Goals: Education classes will be provided on a weekly basis, covering required topics. Participant will state understanding/return demonstration of topics presented.  Learning Barriers/Preferences:     Learning Barriers/Preferences - 06/02/16 1431      Learning Barriers/Preferences   Learning Barriers None   Learning Preferences Audio;Individual Instruction;Verbal Instruction      Education Topics: General Nutrition Guidelines/Fats and Fiber: -Group instruction provided by verbal, written material, models and posters to present the general guidelines for heart healthy nutrition. Gives an explanation and review of dietary fats and fiber.   Controlling Sodium/Reading Food Labels: -Group verbal and written material supporting the discussion of sodium use in heart healthy nutrition.  Review and explanation with models, verbal and written materials for utilization of the food label.   Exercise Physiology & Risk Factors: - Group verbal and written instruction with models to review the exercise physiology of the cardiovascular system and associated critical values. Details cardiovascular disease risk factors and the goals associated with each risk factor.   Aerobic Exercise & Resistance Training: - Gives group verbal and written discussion on the health impact of inactivity. On the components of aerobic and resistive training programs and the benefits of this training and how to safely progress through these programs.   Flexibility, Balance, General Exercise Guidelines: - Provides group verbal and written instruction on the benefits of flexibility and balance training programs. Provides general exercise guidelines with specific guidelines to those with heart or lung disease. Demonstration and skill practice provided.   Stress Management: - Provides group verbal and written instruction about the health risks of elevated stress, cause of high stress, and healthy ways to reduce stress. Flowsheet Row Cardiac Rehab from 06/30/2016 in [facility] Cardiac and Pulmonary Rehab  Date  06/18/16  Educator  CK  Instruction Review Code  2- meets goals/outcomes      Depression: - Provides group verbal and written instruction on the correlation between heart/lung disease and depressed mood, treatment options, and the stigmas associated with seeking treatment.   Anatomy & Physiology of the Heart: - Group verbal and written instruction and models provide basic cardiac anatomy and physiology, with the coronary electrical and arterial systems. Review of: AMI, Angina, Valve disease, Heart Failure, Cardiac Arrhythmia, Pacemakers, and the ICD.   Cardiac Procedures: - Group verbal and written instruction and models to describe the testing methods done to diagnose heart disease. Reviews the  outcomes of the test results. Describes the treatment choices: Medical Management, Angioplasty, or Coronary Bypass Surgery. Flowsheet Row Cardiac Rehab from  06/30/2016 in [facility] Cardiac and Pulmonary Rehab  Date  06/23/16  Educator  CE  Instruction Review Code  2- meets goals/outcomes      Cardiac Medications: - Group verbal and written instruction to review commonly prescribed medications for heart disease. Reviews the medication, class of the drug, and side effects. Includes the steps to properly store meds and maintain the prescription regimen. Flowsheet Row Cardiac Rehab from 06/30/2016 in [facility] Cardiac and Pulmonary Rehab  Date  06/30/16  Educator  S. Steve Gregg, RN  Instruction Review Code  2- meets goals/outcomes      Go Sex-Intimacy & Heart Disease, Get SMART - Goal Setting: - Group verbal and written instruction through game format to discuss heart disease and the return to sexual intimacy. Provides group verbal and written material to discuss and apply goal setting through the application of the S.M.A.R.T. Method. Flowsheet Row Cardiac Rehab from 06/30/2016 in [facility] Cardiac and Pulmonary Rehab  Date  06/23/16  Educator  CE  Instruction Review Code  2- meets goals/outcomes      Other Matters of the Heart: - Provides group verbal, written materials and models to describe Heart Failure, Angina, Valve Disease, and Diabetes in the realm of heart disease. Includes description of the disease process and treatment options available to the cardiac patient.   Exercise & Equipment Safety: - Individual verbal instruction and demonstration of equipment use and safety with use of the equipment. Flowsheet Row Cardiac Rehab from 06/30/2016 in [facility] Cardiac and Pulmonary Rehab  Date  06/02/16  Educator  SB  Instruction Review Code  2- meets goals/outcomes      Infection Prevention: - Provides verbal and written material to individual with discussion of infection control including proper hand  washing and proper equipment cleaning during exercise session. Flowsheet Row Cardiac Rehab from 06/30/2016 in [facility] Cardiac and Pulmonary Rehab  Date  06/02/16  Educator  SB  Instruction Review Code  2- meets goals/outcomes      Falls Prevention: - Provides verbal and written material to individual with discussion of falls prevention and safety.   Diabetes: - Individual verbal and written instruction to review signs/symptoms of diabetes, desired ranges of glucose level fasting, after meals and with exercise. Advice that pre and post exercise glucose checks will be done for 3 sessions at entry of program.    Knowledge Questionnaire Score:     Knowledge Questionnaire Score - 06/02/16 1431      Knowledge Questionnaire Score   Pre Score 25/28      Core Components/Risk Factors/Patient Goals at Admission:     Personal Goals and Risk Factors at Admission - 06/02/16 1431      Core Components/Risk Factors/Patient Goals on Admission    Weight Management Yes;Weight Maintenance   Intervention Weight Management: Develop a combined nutrition and exercise program designed to reach desired caloric intake, while maintaining appropriate intake of nutrient and fiber, sodium and fats, and appropriate energy expenditure required for the weight goal.;Weight Management: Provide education and appropriate resources to help participant work on and attain dietary goals.   Expected Outcomes Weight Maintenance: Understanding of the daily nutrition guidelines, which includes 25-35% calories from fat, 7% or less cal from saturated fats, less than 277m cholesterol, less than 1.5gm of sodium, & 5 or more servings of fruits and vegetables daily   Increase Strength and Stamina Yes   Intervention Provide advice, education, support and counseling about physical activity/exercise needs.   Expected Outcomes Achievement of increased cardiorespiratory fitness and enhanced flexibility,  muscular endurance and strength  shown through measurements of functional capacity and personal statement of participant.   Hypertension Yes   Intervention Provide education on lifestyle modifcations including regular physical activity/exercise, weight management, moderate sodium restriction and increased consumption of fresh fruit, vegetables, and low fat dairy, alcohol moderation, and smoking cessation.;Monitor prescription use compliance.   Expected Outcomes Short Term: Continued assessment and intervention until BP is < 140/78m HG in hypertensive participants. < 130/867mHG in hypertensive participants with diabetes, heart failure or chronic kidney disease.;Long Term: Maintenance of blood pressure at goal levels.   Lipids Yes   Intervention Provide education and support for participant on nutrition & aerobic/resistive exercise along with prescribed medications to achieve LDL <7054mHDL >15m65m Expected Outcomes Short Term: Participant states understanding of desired cholesterol values and is compliant with medications prescribed. Participant is following exercise prescription and nutrition guidelines.;Long Term: Cholesterol controlled with medications as prescribed, with individualized exercise RX and with personalized nutrition plan. Value goals: LDL < 70mg35mL > 40 mg.   Stress Yes   Intervention Offer individual and/or small group education and counseling on adjustment to heart disease, stress management and health-related lifestyle change. Teach and support self-help strategies.;Refer participants experiencing significant psychosocial distress to appropriate mental health specialists for further evaluation and treatment. When possible, include family members and significant others in education/counseling sessions.   Expected Outcomes Short Term: Participant demonstrates changes in health-related behavior, relaxation and other stress management skills, ability to obtain effective social support, and compliance with psychotropic  medications if prescribed.;Long Term: Emotional wellbeing is indicated by absence of clinically significant psychosocial distress or social isolation.      Core Components/Risk Factors/Patient Goals Review:    Core Components/Risk Factors/Patient Goals at Discharge (Final Review):    ITP Comments:     ITP Comments    Row Name 06/02/16 1414 06/11/16 1116 07/09/16 0621       ITP Comments INitial ITP created during medical review. [facility] 30 day review. Continue with ITP unless changes noted by Medical Director at signature of review.  New to program 30 day review completed for Medical Director physician review and signature. Continue ITP unless changes made by physician.        Comments:

## 2016-07-14 ENCOUNTER — Encounter: Payer: BLUE CROSS/BLUE SHIELD | Admitting: *Deleted

## 2016-07-14 DIAGNOSIS — Z48812 Encounter for surgical aftercare following surgery on the circulatory system: Secondary | ICD-10-CM | POA: Diagnosis present

## 2016-07-14 DIAGNOSIS — I214 Non-ST elevation (NSTEMI) myocardial infarction: Secondary | ICD-10-CM

## 2016-07-14 DIAGNOSIS — Z9889 Other specified postprocedural states: Secondary | ICD-10-CM | POA: Diagnosis not present

## 2016-07-14 DIAGNOSIS — Z955 Presence of coronary angioplasty implant and graft: Secondary | ICD-10-CM

## 2016-07-14 NOTE — Progress Notes (Addendum)
Daily Session Note  Patient Details  Name: Carlos Diaz MRN: [id number] Date of Birth: 1961/03/06 Referring Provider:   Flowsheet Row Cardiac Rehab from 06/02/2016 in [facility] Cardiac and Pulmonary Rehab  Referring Provider  Neoma Laming MD      Encounter Date: 07/14/2016  Check In:     Session Check In - 07/14/16 1619      Check-In   Location [facility]-Cardiac & Pulmonary Rehab   Staff Present Gerlene Burdock, RN, Moises Blood, BS, ACSM CEP, Exercise Physiologist;Susanne Bice, RN, BSN, CCRP   Supervising physician immediately available to respond to emergencies See telemetry face sheet for immediately available ER MD   Medication changes reported     No   Fall or balance concerns reported    No   Warm-up and Cool-down Performed on first and last piece of equipment   Resistance Training Performed Yes   VAD Patient? No     Pain Assessment   Currently in Pain? No/denies   Multiple Pain Sites No         Goals Met:  Independence with exercise equipment Exercise tolerated well No report of cardiac concerns or symptoms Strength training completed today  Goals Unmet:  Not Applicable  Comments: Pt able to follow exercise prescription today without complaint.  Will continue to monitor for progression.  Home exercise guidelines reviewed with patient. Patient demonstrated understanding of these guidelines and plans to incorporate more activity in at home.     Dr. Emily Filbert is Medical Director for [facility].

## 2016-07-16 ENCOUNTER — Encounter: Payer: BLUE CROSS/BLUE SHIELD | Admitting: *Deleted

## 2016-07-16 DIAGNOSIS — Z48812 Encounter for surgical aftercare following surgery on the circulatory system: Secondary | ICD-10-CM | POA: Diagnosis not present

## 2016-07-16 DIAGNOSIS — I214 Non-ST elevation (NSTEMI) myocardial infarction: Secondary | ICD-10-CM

## 2016-07-16 DIAGNOSIS — Z955 Presence of coronary angioplasty implant and graft: Secondary | ICD-10-CM

## 2016-07-16 NOTE — Progress Notes (Signed)
Daily Session Note  Patient Details  Name: Carlos Diaz MRN: [id number] Date of Birth: [birth year] Referring Provider:   Flowsheet Row Cardiac Rehab from 06/02/2016 in [facility] Cardiac and Pulmonary Rehab  Referring Provider  Neoma Laming MD      Encounter Date: 07/16/2016  Check In:     Session Check In - 07/16/16 1624      Check-In   Location [facility]-Cardiac & Pulmonary Rehab   Staff Present Earlean Shawl, BS, ACSM CEP, Exercise Physiologist;Other;Amanda Oletta Darter, BA, ACSM CEP, Exercise Physiologist  Raford Pitcher R.N.    Medication changes reported     No   Fall or balance concerns reported    No   Warm-up and Cool-down Performed on first and last piece of equipment   Resistance Training Performed Yes   VAD Patient? No     Pain Assessment   Currently in Pain? No/denies         Goals Met:  Proper associated with RPD/PD & O2 Sat Exercise tolerated well  Goals Unmet:  Not Applicable  Comments:     Dr. Emily Filbert is Medical Director for [place] and [facility].

## 2016-07-17 DIAGNOSIS — Z955 Presence of coronary angioplasty implant and graft: Secondary | ICD-10-CM

## 2016-07-17 DIAGNOSIS — Z48812 Encounter for surgical aftercare following surgery on the circulatory system: Secondary | ICD-10-CM | POA: Diagnosis not present

## 2016-07-17 DIAGNOSIS — I214 Non-ST elevation (NSTEMI) myocardial infarction: Secondary | ICD-10-CM

## 2016-07-17 NOTE — Progress Notes (Signed)
Daily Session Note  Patient Details  Name: Carlos Diaz MRN: [id number] Date of Birth: Jan 19, 1961 Referring Provider:   Flowsheet Row Cardiac Rehab from 06/02/2016 in [facility] Cardiac and Pulmonary Rehab  Referring Provider  Neoma Laming MD      Encounter Date: 07/17/2016  Check In:     Session Check In - 07/17/16 1706      Check-In   Location [facility]-Cardiac & Pulmonary Rehab   Staff Present Nyoka Cowden, RN, BSN, [place], BS, ACSM CEP, Exercise Physiologist;Wilfrido Luedke Oletta Darter, [place], ACSM CEP, Exercise Physiologist;Other   Supervising physician immediately available to respond to emergencies See telemetry face sheet for immediately available ER MD   Medication changes reported     No   Fall or balance concerns reported    No   Warm-up and Cool-down Performed on first and last piece of equipment   Resistance Training Performed Yes   VAD Patient? No     Pain Assessment   Currently in Pain? No/denies   Multiple Pain Sites No           Exercise Prescription Changes - 07/17/16 1100      Exercise Review   Progression Yes     Response to Exercise   Blood Pressure (Admit) 124/82   Blood Pressure (Exercise) 156/80   Blood Pressure (Exit) 110/62   Heart Rate (Admit) 70 bpm   Heart Rate (Exercise) 142 bpm   Heart Rate (Exit) 46 bpm   Symptoms none     Progression   Progression Continue to progress workloads to maintain intensity without signs/symptoms of physical distress.     Resistance Training   Training Prescription Yes   Weight 4   Reps 10-12     Interval Training   Interval Training No     Treadmill   MPH 3.5   Grade 2   Minutes 15   METs 4.65     Elliptical   Level 2   Speed 4.6   Minutes 15      Goals Met:  Independence with exercise equipment Exercise tolerated well No report of cardiac concerns or symptoms Strength training completed today  Goals Unmet:  Not Applicable  Comments: Pt able to follow exercise prescription today without  complaint.  Will continue to monitor for progression.    Dr. Emily Filbert is Medical Director for [place] and [facility].

## 2016-07-21 ENCOUNTER — Encounter: Payer: BLUE CROSS/BLUE SHIELD | Admitting: *Deleted

## 2016-07-21 DIAGNOSIS — Z955 Presence of coronary angioplasty implant and graft: Secondary | ICD-10-CM

## 2016-07-21 DIAGNOSIS — Z48812 Encounter for surgical aftercare following surgery on the circulatory system: Secondary | ICD-10-CM | POA: Diagnosis not present

## 2016-07-21 DIAGNOSIS — I214 Non-ST elevation (NSTEMI) myocardial infarction: Secondary | ICD-10-CM

## 2016-07-21 NOTE — Progress Notes (Signed)
Daily Session Note  Patient Details  Name: Carlos Diaz MRN: [id number] Date of Birth: 15-Jun-1961 Referring Provider:   Flowsheet Row Cardiac Rehab from 06/02/2016 in [facility] Cardiac and Pulmonary Rehab  Referring Provider  Neoma Laming MD      Encounter Date: 07/21/2016  Check In:     Session Check In - 07/21/16 1635      Check-In   Staff Present Heath Lark, RN, BSN, Laveda Norman, BS, ACSM CEP, Exercise Physiologist  Levell July RN   Supervising physician immediately available to respond to emergencies See telemetry face sheet for immediately available ER MD   Medication changes reported     No   Fall or balance concerns reported    No   Warm-up and Cool-down Performed on first and last piece of equipment   Resistance Training Performed Yes   VAD Patient? No     Pain Assessment   Currently in Pain? No/denies         Goals Met:  Independence with exercise equipment Exercise tolerated well  Goals Unmet:  Not Applicable  Comments: Doing well with exercise prescription progression.    Dr. Emily Filbert is Medical Director for [place] and [facility].

## 2016-07-23 ENCOUNTER — Encounter: Payer: BLUE CROSS/BLUE SHIELD | Admitting: *Deleted

## 2016-07-23 DIAGNOSIS — I214 Non-ST elevation (NSTEMI) myocardial infarction: Secondary | ICD-10-CM

## 2016-07-23 DIAGNOSIS — Z48812 Encounter for surgical aftercare following surgery on the circulatory system: Secondary | ICD-10-CM | POA: Diagnosis not present

## 2016-07-23 NOTE — Progress Notes (Signed)
Daily Session Note  Patient Details  Name: Carlos Diaz MRN: [id number] Date of Birth: [birth year] Referring Provider:   Flowsheet Row Cardiac Rehab from 06/02/2016 in [facility] Cardiac and Pulmonary Rehab  Referring Provider  Neoma Laming MD      Encounter Date: 07/23/2016  Check In:     Session Check In - 07/23/16 1659      Check-In   Location [facility]-Cardiac & Pulmonary Rehab   Staff Present Gerlene Burdock, RN, Vickki Hearing, BA, ACSM CEP, Exercise Physiologist  Jena Gauss, RN   Supervising physician immediately available to respond to emergencies See telemetry face sheet for immediately available ER MD   Medication changes reported     No   Fall or balance concerns reported    No   Warm-up and Cool-down Performed on first and last piece of equipment   Resistance Training Performed Yes   VAD Patient? No     Pain Assessment   Currently in Pain? No/denies         Goals Met:  Proper associated with RPD/PD & O2 Sat Exercise tolerated well  Goals Unmet:  Not Applicable  Comments:     Dr. Emily Filbert is Medical Director for [place] and [facility].

## 2016-07-28 ENCOUNTER — Encounter: Payer: BLUE CROSS/BLUE SHIELD | Admitting: *Deleted

## 2016-07-28 DIAGNOSIS — Z48812 Encounter for surgical aftercare following surgery on the circulatory system: Secondary | ICD-10-CM | POA: Diagnosis not present

## 2016-07-28 DIAGNOSIS — I214 Non-ST elevation (NSTEMI) myocardial infarction: Secondary | ICD-10-CM

## 2016-07-28 NOTE — Progress Notes (Signed)
Daily Session Note  Patient Details  Name: Carlos Diaz MRN: [id number] Date of Birth: 04/26/1961 Referring Provider:   Flowsheet Row Cardiac Rehab from 06/02/2016 in [facility] Cardiac and Pulmonary Rehab  Referring Provider  Neoma Laming MD      Encounter Date: 07/28/2016  Check In:     Session Check In - 07/28/16 1631      Check-In   Location [facility]-Cardiac & Pulmonary Rehab   Staff Present Heath Lark, RN, BSN, CCRP;Makendra Vigeant, RN, Moises Blood, BS, ACSM CEP, Exercise Physiologist   Supervising physician immediately available to respond to emergencies See telemetry face sheet for immediately available ER MD   Medication changes reported     No   Fall or balance concerns reported    No   Warm-up and Cool-down Performed on first and last piece of equipment   Resistance Training Performed Yes   VAD Patient? No     Pain Assessment   Currently in Pain? No/denies         Goals Met:  Proper associated with RPD/PD & O2 Sat Exercise tolerated well No report of cardiac concerns or symptoms  Goals Unmet:  Not Applicable  Comments:     Dr. Emily Filbert is Medical Director for [place] and [facility].

## 2016-07-30 ENCOUNTER — Encounter: Payer: BLUE CROSS/BLUE SHIELD | Admitting: *Deleted

## 2016-07-30 DIAGNOSIS — I214 Non-ST elevation (NSTEMI) myocardial infarction: Secondary | ICD-10-CM

## 2016-07-30 DIAGNOSIS — Z48812 Encounter for surgical aftercare following surgery on the circulatory system: Secondary | ICD-10-CM | POA: Diagnosis not present

## 2016-07-30 DIAGNOSIS — Z955 Presence of coronary angioplasty implant and graft: Secondary | ICD-10-CM

## 2016-07-30 NOTE — Progress Notes (Signed)
Daily Session Note  Patient Details  Name: Carlos Diaz MRN: [id number] Date of Birth: [birth year] Referring Provider:   Flowsheet Row Cardiac Rehab from 06/02/2016 in [facility] Cardiac and Pulmonary Rehab  Referring Provider  Neoma Laming MD      Encounter Date: 07/30/2016  Check In:     Session Check In - 07/30/16 1636      Check-In   Staff Present Heath Lark, RN, BSN, CCRP;Amanda Sommer, BA, ACSM CEP, Exercise Physiologist  Levell July RN BSN   Supervising physician immediately available to respond to emergencies See telemetry face sheet for immediately available ER MD   Medication changes reported     No   Fall or balance concerns reported    No   Warm-up and Cool-down Performed on first and last piece of equipment   Resistance Training Performed Yes   VAD Patient? No     Pain Assessment   Currently in Pain? No/denies           Exercise Prescription Changes - 07/30/16 1400      Response to Exercise   Blood Pressure (Admit) 142/80   Blood Pressure (Exercise) 172/80   Blood Pressure (Exit) 124/82   Heart Rate (Admit) 95 bpm   Heart Rate (Exercise) 124 bpm   Heart Rate (Exit) 78 bpm     Progression   Progression Continue to progress workloads to maintain intensity without signs/symptoms of physical distress.   Average METs 4.65     Resistance Training   Training Prescription Yes   Weight 10   Reps 8-10     Interval Training   Interval Training No     Treadmill   MPH 3.5   Grade 2   Minutes 15   METs 4.65     REL-XR   Level 9   Minutes 15      Goals Met:  Exercise tolerated well No report of cardiac concerns or symptoms Strength training completed today  Goals Unmet:  Not Applicable  Comments: Doing well with exercise prescription progression.    Dr. Emily Filbert is Medical Director for [place] and [facility].

## 2016-07-31 DIAGNOSIS — Z48812 Encounter for surgical aftercare following surgery on the circulatory system: Secondary | ICD-10-CM | POA: Diagnosis not present

## 2016-07-31 DIAGNOSIS — Z955 Presence of coronary angioplasty implant and graft: Secondary | ICD-10-CM

## 2016-07-31 DIAGNOSIS — I214 Non-ST elevation (NSTEMI) myocardial infarction: Secondary | ICD-10-CM

## 2016-07-31 NOTE — Progress Notes (Signed)
Daily Session Note  Patient Details  Name: Boyde Grieco MRN: [id number] Date of Birth: 11/09/1960 Referring Provider:   Flowsheet Row Cardiac Rehab from 06/02/2016 in [facility] Cardiac and Pulmonary Rehab  Referring Provider  Neoma Laming MD      Encounter Date: 07/31/2016  Check In:     Session Check In - 07/31/16 1729      Check-In   Location [facility]-Cardiac & Pulmonary Rehab   Staff Present Earlean Shawl, BS, ACSM CEP, Exercise Physiologist;Wynell Halberg Oletta Darter, BA, ACSM CEP, Exercise Physiologist;Other   Supervising physician immediately available to respond to emergencies See telemetry face sheet for immediately available ER MD   Medication changes reported     No   Fall or balance concerns reported    No   Warm-up and Cool-down Performed on first and last piece of equipment   Resistance Training Performed Yes   VAD Patient? No     Pain Assessment   Currently in Pain? No/denies   Multiple Pain Sites No         Goals Met:  Independence with exercise equipment Exercise tolerated well No report of cardiac concerns or symptoms Strength training completed today  Goals Unmet:  Not Applicable  Comments: Reviewed home exercise with pt today.  Pt plans to walk and use gym at [facility] for exercise.  Reviewed THR, pulse, RPE, sign and symptoms, NTG use, and when to call 911 or MD.  Also discussed weather considerations and indoor options.  Pt voiced understanding.   Dr. Emily Filbert is Medical Director for [place] and [facility].

## 2016-08-04 ENCOUNTER — Encounter: Payer: BLUE CROSS/BLUE SHIELD | Attending: Cardiovascular Disease

## 2016-08-04 DIAGNOSIS — Z48812 Encounter for surgical aftercare following surgery on the circulatory system: Secondary | ICD-10-CM | POA: Diagnosis not present

## 2016-08-04 DIAGNOSIS — Z955 Presence of coronary angioplasty implant and graft: Secondary | ICD-10-CM

## 2016-08-04 DIAGNOSIS — Z9889 Other specified postprocedural states: Secondary | ICD-10-CM | POA: Diagnosis not present

## 2016-08-04 DIAGNOSIS — I214 Non-ST elevation (NSTEMI) myocardial infarction: Secondary | ICD-10-CM

## 2016-08-04 NOTE — Progress Notes (Signed)
Daily Session Note  Patient Details  Name: Carlos Diaz MRN: [id number] Date of Birth: [birth year] Referring Provider:   Flowsheet Row Cardiac Rehab from 06/02/2016 in [facility] Cardiac and Pulmonary Rehab  Referring Provider  Neoma Laming MD      Encounter Date: 08/04/2016  Check In:     Session Check In - 08/04/16 1652      Check-In   Location [facility]-Cardiac & Pulmonary Rehab   Staff Present Heath Lark, RN, BSN, CCRP;Aadi Bordner, DPT, Ronaldo Miyamoto, BS, ACSM CEP, Exercise Physiologist   Supervising physician immediately available to respond to emergencies See telemetry face sheet for immediately available ER MD   Medication changes reported     No   Fall or balance concerns reported    No   Warm-up and Cool-down Performed on first and last piece of equipment   Resistance Training Performed Yes   VAD Patient? No     Pain Assessment   Currently in Pain? No/denies   Multiple Pain Sites No         Goals Met:  Independence with exercise equipment  Goals Unmet:  Not Applicable  Comments: Patient completed exercise prescription and all exercise goals during rehab session. The exercise was tolerated well and the patient is progressing in the program.    Dr. Emily Filbert is Medical Director for [place] and [facility].

## 2016-08-06 ENCOUNTER — Encounter: Payer: Self-pay | Admitting: *Deleted

## 2016-08-06 ENCOUNTER — Encounter: Payer: BLUE CROSS/BLUE SHIELD | Admitting: *Deleted

## 2016-08-06 DIAGNOSIS — Z955 Presence of coronary angioplasty implant and graft: Secondary | ICD-10-CM

## 2016-08-06 DIAGNOSIS — I214 Non-ST elevation (NSTEMI) myocardial infarction: Secondary | ICD-10-CM

## 2016-08-06 DIAGNOSIS — Z48812 Encounter for surgical aftercare following surgery on the circulatory system: Secondary | ICD-10-CM | POA: Diagnosis not present

## 2016-08-06 NOTE — Progress Notes (Signed)
Daily Session Note  Patient Details  Name: Elizardo Chilson MRN: [id number] Date of Birth: [birth year] Referring Provider:   Flowsheet Row Cardiac Rehab from 06/02/2016 in [facility] Cardiac and Pulmonary Rehab  Referring Provider  Neoma Laming MD      Encounter Date: 08/06/2016  Check In:     Session Check In - 08/06/16 1749      Check-In   Location [facility]-Cardiac & Pulmonary Rehab   Staff Present Gerlene Burdock, RN, Vickki Hearing, BA, ACSM CEP, Exercise Physiologist  Jena Gauss, RN   Supervising physician immediately available to respond to emergencies See telemetry face sheet for immediately available ER MD   Medication changes reported     No   Fall or balance concerns reported    No   Warm-up and Cool-down Performed on first and last piece of equipment   Resistance Training Performed Yes   VAD Patient? No     Pain Assessment   Currently in Pain? No/denies         Goals Met:  Proper associated with RPD/PD & O2 Sat No report of cardiac concerns or symptoms  Goals Unmet:  Not Applicable  Comments:     Dr. Emily Filbert is Medical Director for [place] and [facility].

## 2016-08-06 NOTE — Progress Notes (Signed)
Cardiac Individual Treatment Plan  Patient Details  Name: Carlos Diaz MRN: [id number] Date of Birth: 05/19/1961 Referring Provider:   Flowsheet Row Cardiac Rehab from 06/02/2016 in [facility] Cardiac and Pulmonary Rehab  Referring Provider  Neoma Laming MD      Initial Encounter Date:  Flowsheet Row Cardiac Rehab from 06/02/2016 in [facility] Cardiac and Pulmonary Rehab  Date  06/02/16  Referring Provider  Neoma Laming MD      Visit Diagnosis: NSTEMI (non-ST elevated myocardial infarction) [facility])  Status post coronary artery stent placement  Patient's Home Medications on Admission:  Current Outpatient Prescriptions:  .  aspirin 81 MG chewable tablet, Chew 1 tablet (81 mg total) by mouth daily., Disp: 30 tablet, Rfl: 1 .  atorvastatin (LIPITOR) 40 MG tablet, Take 1 tablet (40 mg total) by mouth daily., Disp: 30 tablet, Rfl: 1 .  citalopram (CELEXA) 20 MG tablet, Take by mouth., Disp: , Rfl:  .  clonazePAM (KLONOPIN) 0.5 MG tablet, Take by mouth., Disp: , Rfl:  .  pantoprazole (PROTONIX) 40 MG tablet, Take by mouth., Disp: , Rfl:  .  ranolazine (RANEXA) 500 MG 12 hr tablet, Take by mouth., Disp: , Rfl:  .  sucralfate (CARAFATE) 1 g tablet, Take by mouth., Disp: , Rfl:  .  ticagrelor (BRILINTA) 90 MG TABS tablet, Take 1 tablet (90 mg total) by mouth 2 (two) times daily., Disp: 60 tablet, Rfl: 2  Past Medical History: Past Medical History:  Diagnosis Date  . Anxiety   . Hypertension   . Lyme disease     Tobacco Use: History  Smoking Status  . Never Smoker  Smokeless Tobacco  . Never Used    Labs: Recent Review Flowsheet Data    Labs for ITP Cardiac and Pulmonary Rehab Latest Ref Rng & Units 04/14/2016   Cholestrol 0 - 200 mg/dL 192   LDLCALC 0 - 99 mg/dL 126(H)   HDL >40 mg/dL 35(L)   Trlycerides <150 mg/dL 154(H)   Hemoglobin A1c 4.0 - 6.0 % 5.2       Exercise Target Goals:    Exercise Program Goal: Individual exercise prescription set with THRR, safety & activity  barriers. Participant demonstrates ability to understand and report RPE using BORG scale, to self-measure pulse accurately, and to acknowledge the importance of the exercise prescription.  Exercise Prescription Goal: Starting with aerobic activity 30 plus minutes a day, 3 days per week for initial exercise prescription. Provide home exercise prescription and guidelines that participant acknowledges understanding prior to discharge.  Activity Barriers & Risk Stratification:     Activity Barriers & Cardiac Risk Stratification - 06/02/16 1430      Activity Barriers & Cardiac Risk Stratification   Activity Barriers Joint Problems  Both hands with joint pain with gripping and use of hands. History of Lyme Disease.  Previously dislocated shoulder   Cardiac Risk Stratification High      6 Minute Walk:     6 Minute Walk    Row Name 06/02/16 1513         6 Minute Walk   Phase Initial     Distance 1900 feet     Walk Time 6 minutes     # of Rest Breaks 0     MPH 3.6     METS 4.53     RPE 11     VO2 Peak 15.84     Symptoms No     Resting HR 68 bpm     Resting BP 122/70  Max Ex. HR 108 bpm     Max Ex. BP 130/76     2 Minute Post BP 126/70        Initial Exercise Prescription:     Initial Exercise Prescription - 06/02/16 1500      Date of Initial Exercise RX and Referring Provider   Date 06/02/16   Referring Provider Neoma Laming MD     Treadmill   MPH 3.5   Grade 2   Minutes 15   METs 4.64     Elliptical   Level 2   Speed 4.6   Minutes 15     REL-XR   Level 4   Watts --  speed 50 rpm   Minutes 15   METs 4.5     Prescription Details   Frequency (times per week) 3   Duration Progress to 45 minutes of aerobic exercise without signs/symptoms of physical distress     Intensity   THRR 40-80% of Max Heartrate 106-144   Ratings of Perceived Exertion 11-13   Perceived Dyspnea 0-4     Progression   Progression Continue to progress workloads to maintain  intensity without signs/symptoms of physical distress.     Resistance Training   Training Prescription Yes   Weight 5 lbs   Reps 10-12      Perform Capillary Blood Glucose checks as needed.  Exercise Prescription Changes:     Exercise Prescription Changes    Row Name 06/02/16 1500 06/19/16 1100 07/02/16 1200 07/14/16 1800 07/17/16 1100     Exercise Review   Progression -  walk test results Yes Yes  - Yes     Response to Exercise   Blood Pressure (Admit) 122/70 122/78 124/72  - 124/82   Blood Pressure (Exercise) 130/76 172/86 150/84  - 156/80   Blood Pressure (Exit) 126/70 140/70 138/80  - 110/62   Heart Rate (Admit) 68 bpm 70 bpm 68 bpm  - 70 bpm   Heart Rate (Exercise) 108 bpm 116 bpm 118 bpm  - 142 bpm   Heart Rate (Exit) 71 bpm 78 bpm 78 bpm  - 46 bpm   Oxygen Saturation (Admit) 96 %  -  -  -  -   Oxygen Saturation (Exit) 100 %  -  -  -  -   Rating of Perceived Exertion (Exercise) 11  -  -  -  -   Symptoms none none none  - none     Progression   Progression  - Continue to progress workloads to maintain intensity without signs/symptoms of physical distress. Continue to progress workloads to maintain intensity without signs/symptoms of physical distress. Continue to progress workloads to maintain intensity without signs/symptoms of physical distress. Continue to progress workloads to maintain intensity without signs/symptoms of physical distress.   Average METs  - 4.23 4.65 4.65  -     Resistance Training   Training Prescription  - Yes Yes Yes Yes   Weight  - '4 4 4 4   ' Reps  - 10-12 10-12 10-12 10-12     Interval Training   Interval Training  - No No No No     Treadmill   MPH  - 3.5 3.5 3.5 3.5   Grade  - '2 2 2 2   ' Minutes  - '15 15 15 15   ' METs  - 4.65 4.65 4.65 4.65     Elliptical   Level  -  - '2 2 2   ' Speed  -  -  4.6 4.6 4.6   Minutes  -  - '15 15 15     ' REL-XR   Level  - 4  -  -  -   Minutes  - 15  -  -  -   METs  - 3.8  -  -  -     Home Exercise Plan    Plans to continue exercise at  -  -  - Home  -   Frequency  -  -  - Add 2 additional days to program exercise sessions.  walking   -   Row Name 07/30/16 1400 07/31/16 1700           Response to Exercise   Blood Pressure (Admit) 142/80  -      Blood Pressure (Exercise) 172/80  -      Blood Pressure (Exit) 124/82  -      Heart Rate (Admit) 95 bpm  -      Heart Rate (Exercise) 124 bpm  -      Heart Rate (Exit) 78 bpm  -        Progression   Progression Continue to progress workloads to maintain intensity without signs/symptoms of physical distress.  -      Average METs 4.65  -        Resistance Training   Training Prescription Yes  -      Weight 10  -      Reps 8-10  -        Interval Training   Interval Training No  -        Treadmill   MPH 3.5  -      Grade 2  -      Minutes 15  -      METs 4.65  -        REL-XR   Level 9  -      Minutes 15  -        Home Exercise Plan   Plans to continue exercise at  [facility] (comment)      Frequency  - -  [employer]         Exercise Comments:     Exercise Comments    Row Name 06/02/16 1520 06/11/16 1800 06/19/16 1121 07/02/16 1243 07/14/16 1809   Exercise Comments Exercise goal is to be able to return to his previous exercise routine with confidence (swimming and lifting) First full day of exercise! Rece was oriented to gym and equipment including functions, settings, policies, and procedures.  Chanson's individual exercise prescription and treatment plan were reviewed.  All starting workloads were established based on the results of the 6 minute walk test done at initial orientation visit.  The plan for exercise progression was also introduced and progression will be customized based on patient's performance and goals. Tomie is progressing well with exercise. Kervens continues to progress well with exercise. Home exercise guidelines reviewed with patient. Patient demonstrated understanding of these guidelines and plans to  incorporate more activity in at home.    [place] Name 07/17/16 1137 07/30/16 1405         Exercise Comments [name] i sporgressing well and will be adding speed to TM and intervals on XR. Carmen is progressing well with exercise.         Discharge Exercise Prescription (Final Exercise Prescription Changes):     Exercise Prescription Changes - 07/31/16 1700      Home Exercise Plan  Plans to continue exercise at [facility] (comment)   Frequency --  [employer]      Nutrition:  Target Goals: Understanding of nutrition guidelines, daily intake of sodium <1563m, cholesterol <2043m calories 30% from fat and 7% or less from saturated fats, daily to have 5 or more servings of fruits and vegetables.  Biometrics:     Pre Biometrics - 06/02/16 1522      Pre Biometrics   Height 6' 0.2" (1.834 m)   Weight 234 lb 8 oz (106.4 kg)   Waist Circumference 41 inches   Hip Circumference 41 inches   Waist to Hip Ratio 1 %   BMI (Calculated) 31.7   Single Leg Stand 30 seconds       Nutrition Therapy Plan and Nutrition Goals:     Nutrition Therapy & Goals - 06/02/16 1418      Intervention Plan   Intervention Prescribe, educate and counsel regarding individualized specific dietary modifications aiming towards targeted core components such as weight, hypertension, lipid management, diabetes, heart failure and other comorbidities.   Expected Outcomes Short Term Goal: Understand basic principles of dietary content, such as calories, fat, sodium, cholesterol and nutrients.;Short Term Goal: A plan has been developed with personal nutrition goals set during dietitian appointment.;Long Term Goal: Adherence to prescribed nutrition plan.      Nutrition Discharge: Rate Your Plate Scores:     Nutrition Assessments - 06/02/16 1418      Rate Your Plate Scores   Pre Score 67   Pre Score % 80 %      Nutrition Goals Re-Evaluation:   Psychosocial: Target Goals: Acknowledge presence or  absence of depression, maximize coping skills, provide positive support system. Participant is able to verbalize types and ability to use techniques and skills needed for reducing stress and depression.  Initial Review & Psychosocial Screening:     Initial Psych Review & Screening - 06/02/16 1424      Initial Review   Current issues with Current Depression;Current Anxiety/Panic;Current Psychotropic Meds;Current Stress Concerns   Source of Stress Concerns Chronic Illness;Financial  Stress caused by chronic illness that went years before diagnosed.  treatment expensive [facility] that could diagnose the Lyme Diseaset hat was causing all of his symptoms.    Comments Used savings up that was planned for early retirement.  Wife has same diagnosis of Lyme Disease and again spemt money for treatment and MD visits.     Family Dynamics   Good Support System? Yes   Concerns Recent loss of child   Comments Son killed last year,hit by vehicle. This weighs daily on DaViyan   Barriers   Psychosocial barriers to participate in program There are no identifiable barriers or psychosocial needs.     Screening Interventions   Interventions Encouraged to exercise      Quality of Life Scores:     Quality of Life - 06/02/16 1424      Quality of Life Scores   Health/Function Pre 20.87 %   Socioeconomic Pre 20.5 %   Psych/Spiritual Pre 20.71 %   Family Pre 21 %   GLOBAL Pre 20.77 %      PHQ-9: Recent Review Flowsheet Data    [facility] 06/[facility]   Decreased Interest 2   Down, Depressed, Hopeless 1   PHQ - 2 Score 3   Altered sleeping 1   Tired, decreased energy 0   Change in  appetite 0   Feeling bad or failure about yourself  2   Trouble concentrating 1   Moving slowly or fidgety/restless 0   Suicidal thoughts 0   PHQ-9 Score 7   Difficult doing work/chores Not difficult at all      Psychosocial Evaluation and  Intervention:     Psychosocial Evaluation - 06/23/16 1735      Psychosocial Evaluation & Interventions   Interventions Relaxation education;Encouraged to exercise with the program and follow exercise prescription;Stress management education   Comments Counselor met with Mr. Ahart (Mr. D) today for initial psychosocial evaluation.  He is a 55 year old who had a heart attack in August.  He has a strong support system with a spouse of 21 years; good friends; an adult daughter close by; and active involvement in his local church.  Mr. D has struggled with Lyme's disease for many years and still has some impact in his physical health.  He does not sleep well; which he describes has been ongoing most of his life; but he does get about 6 hours per night most nights.  Encouraged speaking with Dr. or pharmacist about something natural to help with this.  He reports a history of depression and anxiety since the Lyme's diagnosis in 2001 and is on medications that help with this currently.  Mr. D has had multiple losses in the past two years as he had a son die in an accident while on vacation;; a mother died a few weeks before that; and he has a conflictual relationship with his brother in [place].  Mr. Lenna Sciara reports coping with all of this with the help of a counselor; work; friends and family and he works out regularly.  He reports his mood is typically positive most of the time although he does feel anxious occasionally.  Counselor taught him deep breathing activities and practiced with him; as well as some visualization.  Counselor also encouraged trauma focused therapy at some point once he completes treatment with his current therapist.  Mr. D has goals to get back to normal activities in his life, without being afraid of working out.  Staff will continue to follow with Mr. D throughout the course of this program.     Continued Psychosocial Services Needed Yes  Stress management; relaxation; and sleep needs  to be monitored and treated.      Psychosocial Re-Evaluation:   Vocational Rehabilitation: Provide vocational rehab assistance to qualifying candidates.   Vocational Rehab Evaluation & Intervention:     Vocational Rehab - 06/02/16 1431      Initial Vocational Rehab Evaluation & Intervention   Assessment shows need for Vocational Rehabilitation No      Education: Education Goals: Education classes will be provided on a weekly basis, covering required topics. Participant will state understanding/return demonstration of topics presented.  Learning Barriers/Preferences:     Learning Barriers/Preferences - 06/02/16 1431      Learning Barriers/Preferences   Learning Barriers None   Learning Preferences Audio;Individual Instruction;Verbal Instruction      Education Topics: General Nutrition Guidelines/Fats and Fiber: -Group instruction provided by verbal, written material, models and posters to present the general guidelines for heart healthy nutrition. Gives an explanation and review of dietary fats and fiber. Flowsheet Row Cardiac Rehab from 08/04/2016 in [facility] Cardiac and Pulmonary Rehab  Date  07/14/16  Educator  SB  Instruction Review Code  2- meets goals/outcomes      Controlling Sodium/Reading Food Labels: -Group verbal and written  material supporting the discussion of sodium use in heart healthy nutrition. Review and explanation with models, verbal and written materials for utilization of the food label. Flowsheet Row Cardiac Rehab from 08/04/2016 in [facility] Cardiac and Pulmonary Rehab  Date  07/21/16  Educator  PI  Instruction Review Code  2- meets goals/outcomes      Exercise Physiology & Risk Factors: - Group verbal and written instruction with models to review the exercise physiology of the cardiovascular system and associated critical values. Details cardiovascular disease risk factors and the goals associated with each risk factor.   Aerobic Exercise &  Resistance Training: - Gives group verbal and written discussion on the health impact of inactivity. On the components of aerobic and resistive training programs and the benefits of this training and how to safely progress through these programs. Flowsheet Row Cardiac Rehab from 08/04/2016 in [facility] Cardiac and Pulmonary Rehab  Date  07/28/16  Educator  K. Amedeo Plenty  Instruction Review Code  2- meets goals/outcomes      Flexibility, Balance, General Exercise Guidelines: - Provides group verbal and written instruction on the benefits of flexibility and balance training programs. Provides general exercise guidelines with specific guidelines to those with heart or lung disease. Demonstration and skill practice provided. Flowsheet Row Cardiac Rehab from 08/04/2016 in [facility] Cardiac and Pulmonary Rehab  Date  07/30/16  Educator  AS  Instruction Review Code  2- meets goals/outcomes      Stress Management: - Provides group verbal and written instruction about the health risks of elevated stress, cause of high stress, and healthy ways to reduce stress. Flowsheet Row Cardiac Rehab from 08/04/2016 in [facility] Cardiac and Pulmonary Rehab  Date  06/18/16  Educator  CK  Instruction Review Code  2- meets goals/outcomes      Depression: - Provides group verbal and written instruction on the correlation between heart/lung disease and depressed mood, treatment options, and the stigmas associated with seeking treatment. Flowsheet Row Cardiac Rehab from 08/04/2016 in [facility] Cardiac and Pulmonary Rehab  Date  07/16/16  Educator  Berle Mull, MSW  Instruction Review Code  2- meets goals/outcomes      Anatomy & Physiology of the Heart: - Group verbal and written instruction and models provide basic cardiac anatomy and physiology, with the coronary electrical and arterial systems. Review of: AMI, Angina, Valve disease, Heart Failure, Cardiac Arrhythmia, Pacemakers, and the ICD. Flowsheet Row Cardiac Rehab from  08/04/2016 in [facility] Cardiac and Pulmonary Rehab  Date  08/04/16  Educator  SB  Instruction Review Code  2- meets goals/outcomes      Cardiac Procedures: - Group verbal and written instruction and models to describe the testing methods done to diagnose heart disease. Reviews the outcomes of the test results. Describes the treatment choices: Medical Management, Angioplasty, or Coronary Bypass Surgery. Flowsheet Row Cardiac Rehab from 08/04/2016 in [facility] Cardiac and Pulmonary Rehab  Date  06/23/16  Educator  CE  Instruction Review Code  2- meets goals/outcomes      Cardiac Medications: - Group verbal and written instruction to review commonly prescribed medications for heart disease. Reviews the medication, class of the drug, and side effects. Includes the steps to properly store meds and maintain the prescription regimen. Flowsheet Row Cardiac Rehab from 08/04/2016 in [facility] Cardiac and Pulmonary Rehab  Date  06/30/16  Educator  S. Amon Costilla, RN  Instruction Review Code  2- meets goals/outcomes      Go Sex-Intimacy & Heart Disease, Get SMART - Goal Setting: - Group  verbal and written instruction through game format to discuss heart disease and the return to sexual intimacy. Provides group verbal and written material to discuss and apply goal setting through the application of the S.M.A.R.T. Method. Flowsheet Row Cardiac Rehab from 08/04/2016 in [facility] Cardiac and Pulmonary Rehab  Date  06/23/16  Educator  CE  Instruction Review Code  2- meets goals/outcomes      Other Matters of the Heart: - Provides group verbal, written materials and models to describe Heart Failure, Angina, Valve Disease, and Diabetes in the realm of heart disease. Includes description of the disease process and treatment options available to the cardiac patient. Flowsheet Row Cardiac Rehab from 08/04/2016 in [facility] Cardiac and Pulmonary Rehab  Date  08/04/16  Educator  SB  Instruction Review Code  2- meets goals/outcomes       Exercise & Equipment Safety: - Individual verbal instruction and demonstration of equipment use and safety with use of the equipment. Flowsheet Row Cardiac Rehab from 08/04/2016 in [facility] Cardiac and Pulmonary Rehab  Date  06/02/16  Educator  SB  Instruction Review Code  2- meets goals/outcomes      Infection Prevention: - Provides verbal and written material to individual with discussion of infection control including proper hand washing and proper equipment cleaning during exercise session. Flowsheet Row Cardiac Rehab from 08/04/2016 in [facility] Cardiac and Pulmonary Rehab  Date  06/02/16  Educator  SB  Instruction Review Code  2- meets goals/outcomes      Falls Prevention: - Provides verbal and written material to individual with discussion of falls prevention and safety.   Diabetes: - Individual verbal and written instruction to review signs/symptoms of diabetes, desired ranges of glucose level fasting, after meals and with exercise. Advice that pre and post exercise glucose checks will be done for 3 sessions at entry of program.    Knowledge Questionnaire Score:     Knowledge Questionnaire Score - 06/02/16 1431      Knowledge Questionnaire Score   Pre Score 25/28      Core Components/Risk Factors/Patient Goals at Admission:     Personal Goals and Risk Factors at Admission - 06/02/16 1431      Core Components/Risk Factors/Patient Goals on Admission    Weight Management Yes;Weight Maintenance   Intervention Weight Management: Develop a combined nutrition and exercise program designed to reach desired caloric intake, while maintaining appropriate intake of nutrient and fiber, sodium and fats, and appropriate energy expenditure required for the weight goal.;Weight Management: Provide education and appropriate resources to help participant work on and attain dietary goals.   Expected Outcomes Weight Maintenance: Understanding of the daily nutrition guidelines, which  includes 25-35% calories from fat, 7% or less cal from saturated fats, less than 235m cholesterol, less than 1.5gm of sodium, & 5 or more servings of fruits and vegetables daily   Increase Strength and Stamina Yes   Intervention Provide advice, education, support and counseling about physical activity/exercise needs.   Expected Outcomes Achievement of increased cardiorespiratory fitness and enhanced flexibility, muscular endurance and strength shown through measurements of functional capacity and personal statement of participant.   Hypertension Yes   Intervention Provide education on lifestyle modifcations including regular physical activity/exercise, weight management, moderate sodium restriction and increased consumption of fresh fruit, vegetables, and low fat dairy, alcohol moderation, and smoking cessation.;Monitor prescription use compliance.   Expected Outcomes Short Term: Continued assessment and intervention until BP is < 140/957mHG in hypertensive participants. < 130/8053mG in hypertensive participants with  diabetes, heart failure or chronic kidney disease.;Long Term: Maintenance of blood pressure at goal levels.   Lipids Yes   Intervention Provide education and support for participant on nutrition & aerobic/resistive exercise along with prescribed medications to achieve LDL <90m, HDL >436m   Expected Outcomes Short Term: Participant states understanding of desired cholesterol values and is compliant with medications prescribed. Participant is following exercise prescription and nutrition guidelines.;Long Term: Cholesterol controlled with medications as prescribed, with individualized exercise RX and with personalized nutrition plan. Value goals: LDL < 7033mHDL > 40 mg.   Stress Yes   Intervention Offer individual and/or small group education and counseling on adjustment to heart disease, stress management and health-related lifestyle change. Teach and support self-help strategies.;Refer  participants experiencing significant psychosocial distress to appropriate mental health specialists for further evaluation and treatment. When possible, include family members and significant others in education/counseling sessions.   Expected Outcomes Short Term: Participant demonstrates changes in health-related behavior, relaxation and other stress management skills, ability to obtain effective social support, and compliance with psychotropic medications if prescribed.;Long Term: Emotional wellbeing is indicated by absence of clinically significant psychosocial distress or social isolation.      Core Components/Risk Factors/Patient Goals Review:      Goals and Risk Factor Review    Row Name 07/17/16 1707 08/01/16 0906010/01/17 0907         Core Components/Risk Factors/Patient Goals Review   Personal Goals Review Weight Management/Obesity;Hypertension Weight Management/Obesity;Hypertension Weight Management/Obesity     Review DavKolbeuld like  to lose 10 lb.  he put on a couple lb during vacation but has lost 2 since.  He would like to meet with RD to learn healthy weight loss.  BP has been good. DavMeyeruld like to lose 7 lb from where he is now.  He is up 2 lb from Thanksgiving.  He will schedule an appointment with the RD next week.  His BP has been controlled. DavJunioruld like to lose 7 lb from current weight.  He reports a gain of 2 lb since Thanksgiving.   He will schedule an appointment with an RD next week.     Expected Outcomes  - DavJacobyll make healthy changes to his diet and reach his desired weight.   DavSeibertll reach his weight loss goal and develop healthier dietary habits.        Core Components/Risk Factors/Patient Goals at Discharge (Final Review):      Goals and Risk Factor Review - 08/01/16 0907      Core Components/Risk Factors/Patient Goals Review   Personal Goals Review Weight Management/Obesity   Review DavMunachimsould like to lose 7 lb from current weight.  He  reports a gain of 2 lb since Thanksgiving.   He will schedule an appointment with an RD next week.   Expected Outcomes DavHarelll reach his weight loss goal and develop healthier dietary habits.      ITP Comments:     ITP Comments    Row Name 06/02/16 1414 06/11/16 1116 07/09/16 0621 08/06/16 0619     ITP Comments INitial ITP created during medical review. [facility] 30 day review. Continue with ITP unless changes noted by Medical Director at signature of review.  New to program 30 day review completed for Medical Director physician review and signature. Continue ITP unless changes made by physician. 30 day review completed for review by Dr MarEmily FilbertContinue with ITP  unless changes noted by Dr Sabra Heck.       Comments:

## 2016-08-07 DIAGNOSIS — Z955 Presence of coronary angioplasty implant and graft: Secondary | ICD-10-CM

## 2016-08-07 DIAGNOSIS — Z48812 Encounter for surgical aftercare following surgery on the circulatory system: Secondary | ICD-10-CM | POA: Diagnosis not present

## 2016-08-07 DIAGNOSIS — I214 Non-ST elevation (NSTEMI) myocardial infarction: Secondary | ICD-10-CM

## 2016-08-07 NOTE — Progress Notes (Signed)
Daily Session Note  Patient Details  Name: Carlos Diaz MRN: [id number] Date of Birth: 1960-12-15 Referring Provider:   Flowsheet Row Cardiac Rehab from 06/02/2016 in [facility] Cardiac and Pulmonary Rehab  Referring Provider  Neoma Laming MD      Encounter Date: 08/07/2016  Check In:     Session Check In - 08/07/16 1716      Check-In   Location [facility]-Cardiac & Pulmonary Rehab   Staff Present Earlean Shawl, BS, ACSM CEP, Exercise Physiologist;Amanda Oletta Darter, BA, ACSM CEP, Exercise Physiologist;Other  Jena Gauss, RN   Supervising physician immediately available to respond to emergencies See telemetry face sheet for immediately available ER MD   Medication changes reported     No   Fall or balance concerns reported    No   Warm-up and Cool-down Performed on first and last piece of equipment   Resistance Training Performed Yes   VAD Patient? No     Pain Assessment   Currently in Pain? No/denies   Multiple Pain Sites No         Goals Met:  Proper associated with RPD/PD & O2 Sat Independence with exercise equipment Exercise tolerated well Strength training completed today  Goals Unmet:  Not Applicable  Comments: Pt able to follow exercise prescription today without complaint.  Will continue to monitor for progression.    Dr. Emily Filbert is Medical Director for [place] and [facility].

## 2016-08-11 ENCOUNTER — Encounter: Payer: BLUE CROSS/BLUE SHIELD | Admitting: *Deleted

## 2016-08-11 DIAGNOSIS — I214 Non-ST elevation (NSTEMI) myocardial infarction: Secondary | ICD-10-CM

## 2016-08-11 DIAGNOSIS — Z955 Presence of coronary angioplasty implant and graft: Secondary | ICD-10-CM

## 2016-08-11 DIAGNOSIS — Z48812 Encounter for surgical aftercare following surgery on the circulatory system: Secondary | ICD-10-CM | POA: Diagnosis not present

## 2016-08-11 NOTE — Progress Notes (Signed)
Daily Session Note  Patient Details  Name: Oden Lindaman MRN: [id number] Date of Birth: [birth year] Referring Provider:   Flowsheet Row Cardiac Rehab from 06/02/2016 in [facility] Cardiac and [facility] Rehab  Referring Provider  Neoma Laming MD      Encounter Date: 08/11/2016  Check In:     Session Check In - 08/11/16 1630      Check-In   Location [facility]-Cardiac & Pulmonary Rehab   Staff Present Heath Lark, RN, BSN, Laveda Norman, BS, ACSM CEP, Exercise Physiologist;Carroll Enterkin, RN, BSN   Supervising physician immediately available to respond to emergencies See telemetry face sheet for immediately available ER MD   Medication changes reported     No   Fall or balance concerns reported    No   Warm-up and Cool-down Performed on first and last piece of equipment   Resistance Training Performed Yes   VAD Patient? No     Pain Assessment   Currently in Pain? No/denies   Multiple Pain Sites No         Goals Met:  Independence with exercise equipment Exercise tolerated well No report of cardiac concerns or symptoms Strength training completed today  Goals Unmet:  Not Applicable  Comments: Pt able to follow exercise prescription today without complaint.  Will continue to monitor for progression.    Dr. Emily Filbert is Medical Director for [place] and [facility].

## 2016-08-13 DIAGNOSIS — Z955 Presence of coronary angioplasty implant and graft: Secondary | ICD-10-CM

## 2016-08-13 DIAGNOSIS — Z48812 Encounter for surgical aftercare following surgery on the circulatory system: Secondary | ICD-10-CM | POA: Diagnosis not present

## 2016-08-13 DIAGNOSIS — I214 Non-ST elevation (NSTEMI) myocardial infarction: Secondary | ICD-10-CM

## 2016-08-13 NOTE — Progress Notes (Signed)
Daily Session Note  Patient Details  Name: Carlos Diaz MRN: [id number] Date of Birth: 04-10-1961 Referring Provider:   Flowsheet Row Cardiac Rehab from 06/02/2016 in [facility] Cardiac and Pulmonary Rehab  Referring Provider  Neoma Laming MD      Encounter Date: 08/13/2016  Check In:     Session Check In - 08/13/16 1754      Check-In   Warm-up and Cool-down Performed on first and last piece of equipment   Resistance Training Performed Yes   VAD Patient? No     Pain Assessment   Currently in Pain? No/denies   Multiple Pain Sites No         Goals Met:  Independence with exercise equipment  Goals Unmet:  Not Applicable  Comments: Patient completed exercise prescription and all exercise goals during rehab session. The exercise was tolerated well and the patient is progressing in the program.    Dr. Emily Filbert is Medical Director for [place] and [facility].

## 2016-08-14 VITALS — Ht 72.2 in | Wt 228.0 lb

## 2016-08-14 DIAGNOSIS — Z48812 Encounter for surgical aftercare following surgery on the circulatory system: Secondary | ICD-10-CM | POA: Diagnosis not present

## 2016-08-14 NOTE — Progress Notes (Signed)
Daily Session Note  Patient Details  Name: Carlos Diaz MRN: [id number] Date of Birth: Jan 09, 1961 Referring Provider:   Flowsheet Row Cardiac Rehab from 06/02/2016 in [facility] Cardiac and Pulmonary Rehab  Referring Provider  Neoma Laming MD      Encounter Date: 08/14/2016  Check In:     Session Check In - 08/14/16 1708      Check-In   Location [facility]-Cardiac & Pulmonary Rehab   Staff Present Earlean Shawl, BS, ACSM CEP, Exercise Physiologist;Uno Esau Oletta Darter, BA, ACSM CEP, Exercise Physiologist;Other  Jena Gauss RN   Supervising physician immediately available to respond to emergencies See telemetry face sheet for immediately available ER MD   Medication changes reported     No   Fall or balance concerns reported    No   Warm-up and Cool-down Performed on first and last piece of equipment   Resistance Training Performed Yes   VAD Patient? No     Pain Assessment   Currently in Pain? No/denies   Multiple Pain Sites No           Exercise Prescription Changes - 08/14/16 1000      Exercise Review   Progression Yes     Response to Exercise   Blood Pressure (Admit) 142/84   Blood Pressure (Exercise) 146/78   Blood Pressure (Exit) 110/62   Heart Rate (Admit) 71 bpm   Heart Rate (Exercise) 135 bpm   Heart Rate (Exit) 53 bpm   Symptoms none     Progression   Progression Continue to progress workloads to maintain intensity without signs/symptoms of physical distress.   Average METs 4.54     Resistance Training   Training Prescription Yes   Weight 10   Reps 10-12     Interval Training   Interval Training Yes     Treadmill   MPH 3   Grade 3   Minutes 15   METs 4.54     Elliptical   Level 2   Speed 4.6   Minutes 15      Goals Met:  Independence with exercise equipment Exercise tolerated well No report of cardiac concerns or symptoms Strength training completed today  Goals Unmet:  Not Applicable  Comments:      [place] Name 06/02/16 1513  08/14/16 1709       6 Minute Walk   Phase Initial Discharge    Distance 1900 feet 2190 feet    Distance % Change  - 15 %    Walk Time 6 minutes 6 minutes    # of Rest Breaks 0 0    MPH 3.6 4.14    METS 4.53 5.6    RPE 11 2    VO2 Peak 15.84 19.6    Symptoms No No    Resting HR 68 bpm 102 bpm    Resting BP 122/70 122/84    Max Ex. HR 108 bpm 122 bpm    Max Ex. BP 130/76 172/80    2 Minute Post BP 126/70  -         Dr. Emily Filbert is Medical Director for [place] and [facility].

## 2016-08-18 DIAGNOSIS — I214 Non-ST elevation (NSTEMI) myocardial infarction: Secondary | ICD-10-CM

## 2016-08-18 DIAGNOSIS — Z48812 Encounter for surgical aftercare following surgery on the circulatory system: Secondary | ICD-10-CM | POA: Diagnosis not present

## 2016-08-18 DIAGNOSIS — Z955 Presence of coronary angioplasty implant and graft: Secondary | ICD-10-CM

## 2016-08-18 NOTE — Progress Notes (Signed)
Daily Session Note  Patient Details  Name: Destin Vinsant MRN: [id number] Date of Birth: 11-Jul-1961 Referring Provider:   Flowsheet Row Cardiac Rehab from 06/02/2016 in [facility] Cardiac and Pulmonary Rehab  Referring Provider  Neoma Laming MD      Encounter Date: 08/18/2016  Check In:     Session Check In - 08/18/16 1628      Check-In   Location [facility]-Cardiac & Pulmonary Rehab   Staff Present Heath Lark, RN, BSN, CCRP;Giovani Neumeister, DPT, Ronaldo Miyamoto, BS, ACSM CEP, Exercise Physiologist   Supervising physician immediately available to respond to emergencies See telemetry face sheet for immediately available ER MD   Medication changes reported     No   Fall or balance concerns reported    No         Goals Met:  Independence with exercise equipment Exercise tolerated well Personal goals reviewed No report of cardiac concerns or symptoms Strength training completed today  Goals Unmet:  Not Applicable  Comments:  Tiny graduated today from cardiac rehab with 36 sessions completed.  Details of the patient's exercise prescription and what He needs to do in order to continue the prescription and progress were discussed with patient.  Patient was given a copy of prescription and goals.  Patient verbalized understanding.  Jerry plans to continue to exercise by using fitness facility at his job.    Dr. Emily Filbert is Medical Director for [place] and [facility].

## 2016-08-18 NOTE — Patient Instructions (Addendum)
Discharge Instructions  Patient Details  Name: Carlos Diaz MRN: [id number] Date of Birth: December 04, 1960 Referring Provider:  Dionisio Aws, MD   Number of Visits: 31  Reason for Discharge:  Patient reached a stable level of exercise. Patient independent in their exercise.  Smoking History:  History  Smoking Status  . Never Smoker  Smokeless Tobacco  . Never Used    Diagnosis:  NSTEMI (non-ST elevated myocardial infarction) ([place])  Status post coronary artery stent placement  Initial Exercise Prescription:     Initial Exercise Prescription - 06/02/16 1500      Date of Initial Exercise RX and Referring Provider   Date 06/02/16   Referring Provider Neoma Laming MD     Treadmill   MPH 3.5   Grade 2   Minutes 15   METs 4.64     Elliptical   Level 2   Speed 4.6   Minutes 15     REL-XR   Level 4   Watts --  speed 50 rpm   Minutes 15   METs 4.5     Prescription Details   Frequency (times per week) 3   Duration Progress to 45 minutes of aerobic exercise without signs/symptoms of physical distress     Intensity   THRR 40-80% of Max Heartrate 106-144   Ratings of Perceived Exertion 11-13   Perceived Dyspnea 0-4     Progression   Progression Continue to progress workloads to maintain intensity without signs/symptoms of physical distress.     Resistance Training   Training Prescription Yes   Weight 5 lbs   Reps 10-12      Discharge Exercise Prescription (Final Exercise Prescription Changes):     Exercise Prescription Changes - 08/14/16 1000      Exercise Review   Progression Yes     Response to Exercise   Blood Pressure (Admit) 142/84   Blood Pressure (Exercise) 146/78   Blood Pressure (Exit) 110/62   Heart Rate (Admit) 71 bpm   Heart Rate (Exercise) 135 bpm   Heart Rate (Exit) 53 bpm   Symptoms none     Progression   Progression Continue to progress workloads to maintain intensity without signs/symptoms of physical distress.   Average  METs 4.54     Resistance Training   Training Prescription Yes   Weight 10   Reps 10-12     Interval Training   Interval Training Yes     Treadmill   MPH 3   Grade 3   Minutes 15   METs 4.54     Elliptical   Level 2   Speed 4.6   Minutes 15      Functional Capacity:     [place] Name 06/02/16 1513 08/14/16 1709       6 Minute Walk   Phase Initial Discharge    Distance 1900 feet 2190 feet    Distance % Change  - 15 %    Walk Time 6 minutes 6 minutes    # of Rest Breaks 0 0    MPH 3.6 4.14    METS 4.53 5.6    RPE 11 2    VO2 Peak 15.84 19.6    Symptoms No No    Resting HR 68 bpm 102 bpm    Resting BP 122/70 122/84    Max Ex. HR 108 bpm 122 bpm    Max Ex. BP 130/76 172/80    2 Minute Post BP 126/70  -  Quality of Life:     Quality of Life - 06/02/16 1424      Quality of Life Scores   Health/Function Pre 20.87 %   Socioeconomic Pre 20.5 %   Psych/Spiritual Pre 20.71 %   Family Pre 21 %   GLOBAL Pre 20.77 %      Personal Goals: Goals established at orientation with interventions provided to work toward goal.     Personal Goals and Risk Factors at Admission - 06/02/16 1431      Core Components/Risk Factors/Patient Goals on Admission    Weight Management Yes;Weight Maintenance   Intervention Weight Management: Develop a combined nutrition and exercise program designed to reach desired caloric intake, while maintaining appropriate intake of nutrient and fiber, sodium and fats, and appropriate energy expenditure required for the weight goal.;Weight Management: Provide education and appropriate resources to help participant work on and attain dietary goals.   Expected Outcomes Weight Maintenance: Understanding of the daily nutrition guidelines, which includes 25-35% calories from fat, 7% or less cal from saturated fats, less than 200mg  cholesterol, less than 1.5gm of sodium, & 5 or more servings of fruits and vegetables daily   Increase  Strength and Stamina Yes   Intervention Provide advice, education, support and counseling about physical activity/exercise needs.   Expected Outcomes Achievement of increased cardiorespiratory fitness and enhanced flexibility, muscular endurance and strength shown through measurements of functional capacity and personal statement of participant.   Hypertension Yes   Intervention Provide education on lifestyle modifcations including regular physical activity/exercise, weight management, moderate sodium restriction and increased consumption of fresh fruit, vegetables, and low fat dairy, alcohol moderation, and smoking cessation.;Monitor prescription use compliance.   Expected Outcomes Short Term: Continued assessment and intervention until BP is < 140/64mm HG in hypertensive participants. < 130/22mm HG in hypertensive participants with diabetes, heart failure or chronic kidney disease.;Long Term: Maintenance of blood pressure at goal levels.   Lipids Yes   Intervention Provide education and support for participant on nutrition & aerobic/resistive exercise along with prescribed medications to achieve LDL 70mg , HDL >40mg .   Expected Outcomes Short Term: Participant states understanding of desired cholesterol values and is compliant with medications prescribed. Participant is following exercise prescription and nutrition guidelines.;Long Term: Cholesterol controlled with medications as prescribed, with individualized exercise RX and with personalized nutrition plan. Value goals: LDL < 70mg , HDL > 40 mg.   Stress Yes   Intervention Offer individual and/or small group education and counseling on adjustment to heart disease, stress management and health-related lifestyle change. Teach and support self-help strategies.;Refer participants experiencing significant psychosocial distress to appropriate mental health specialists for further evaluation and treatment. When possible, include family members and significant  others in education/counseling sessions.   Expected Outcomes Short Term: Participant demonstrates changes in health-related behavior, relaxation and other stress management skills, ability to obtain effective social support, and compliance with psychotropic medications if prescribed.;Long Term: Emotional wellbeing is indicated by absence of clinically significant psychosocial distress or social isolation.       Personal Goals Discharge:     Goals and Risk Factor Review - 08/01/16 0907      Core Components/Risk Factors/Patient Goals Review   Personal Goals Review Weight Management/Obesity   Review Yer would like to lose 7 lb from current weight.  He reports a gain of 2 lb since Thanksgiving.   He will schedule an appointment with an RD next week.   Expected Outcomes Kerrie will reach his weight loss goal and develop healthier dietary  habits.      Nutrition & Weight - Outcomes:     Pre Biometrics - 06/02/16 1522      Pre Biometrics   Height 6' 0.2" (1.834 m)   Weight 234 lb 8 oz (106.4 kg)   Waist Circumference 41 inches   Hip Circumference 41 inches   Waist to Hip Ratio 1 %   BMI (Calculated) 31.7   Single Leg Stand 30 seconds         Post Biometrics - 08/14/16 1708       Post  Biometrics   Height 6' 0.2" (1.834 m)   Weight 228 lb (103.4 kg)   Waist Circumference 42.75 inches   Hip Circumference 42.75 inches   Waist to Hip Ratio 1 %   BMI (Calculated) 30.8   Single Leg Stand 30 seconds      Nutrition:     Nutrition Therapy & Goals - 06/02/16 1418      Intervention Plan   Intervention Prescribe, educate and counsel regarding individualized specific dietary modifications aiming towards targeted core components such as weight, hypertension, lipid management, diabetes, heart failure and other comorbidities.   Expected Outcomes Short Term Goal: Understand basic principles of dietary content, such as calories, fat, sodium, cholesterol and nutrients.;Short Term Goal: A  plan has been developed with personal nutrition goals set during dietitian appointment.;Long Term Goal: Adherence to prescribed nutrition plan.      Nutrition Discharge:     Nutrition Assessments - 06/02/16 1418      Rate Your Plate Scores   Pre Score 67   Pre Score % 80 %      Education Questionnaire Score:     Knowledge Questionnaire Score - 06/02/16 1431      Knowledge Questionnaire Score   Pre Score 25/28      Goals reviewed with patient; copy given to patient. Discharge Instructions  Patient Details  Name: Carlos Diaz MRN: [id number] Date of Birth: 02-20-1961 Referring Provider:  Dionisio Nathon, MD   Number of Visits: 49  Reason for Discharge:  Patient reached a stable level of exercise. Patient independent in their exercise.  Smoking History:  History  Smoking Status  . Never Smoker  Smokeless Tobacco  . Never Used    Diagnosis:  NSTEMI (non-ST elevated myocardial infarction) ([place])  Status post coronary artery stent placement  Initial Exercise Prescription:     Initial Exercise Prescription - 06/02/16 1500      Date of Initial Exercise RX and Referring Provider   Date 06/02/16   Referring Provider Neoma Laming MD     Treadmill   MPH 3.5   Grade 2   Minutes 15   METs 4.64     Elliptical   Level 2   Speed 4.6   Minutes 15     REL-XR   Level 4   Watts --  speed 50 rpm   Minutes 15   METs 4.5     Prescription Details   Frequency (times per week) 3   Duration Progress to 45 minutes of aerobic exercise without signs/symptoms of physical distress     Intensity   THRR 40-80% of Max Heartrate 106-144   Ratings of Perceived Exertion 11-13   Perceived Dyspnea 0-4     Progression   Progression Continue to progress workloads to maintain intensity without signs/symptoms of physical distress.     Resistance Training   Training Prescription Yes   Weight 5 lbs   Reps 10-12  Discharge Exercise Prescription (Final Exercise  Prescription Changes):     Exercise Prescription Changes - 08/14/16 1000      Exercise Review   Progression Yes     Response to Exercise   Blood Pressure (Admit) 142/84   Blood Pressure (Exercise) 146/78   Blood Pressure (Exit) 110/62   Heart Rate (Admit) 71 bpm   Heart Rate (Exercise) 135 bpm   Heart Rate (Exit) 53 bpm   Symptoms none     Progression   Progression Continue to progress workloads to maintain intensity without signs/symptoms of physical distress.   Average METs 4.54     Resistance Training   Training Prescription Yes   Weight 10   Reps 10-12     Interval Training   Interval Training Yes     Treadmill   MPH 3   Grade 3   Minutes 15   METs 4.54     Elliptical   Level 2   Speed 4.6   Minutes 15      Functional Capacity:     [place] Name 06/02/16 1513 08/14/16 1709       6 Minute Walk   Phase Initial Discharge    Distance 1900 feet 2190 feet    Distance % Change  - 15 %    Walk Time 6 minutes 6 minutes    # of Rest Breaks 0 0    MPH 3.6 4.14    METS 4.53 5.6    RPE 11 2    VO2 Peak 15.84 19.6    Symptoms No No    Resting HR 68 bpm 102 bpm    Resting BP 122/70 122/84    Max Ex. HR 108 bpm 122 bpm    Max Ex. BP 130/76 172/80    2 Minute Post BP 126/70  -       Quality of Life:     Quality of Life - 08/18/16 1730      Quality of Life Scores   Health/Function Pre 20.87 %   Health/Function Post 21 %   Health/Function % Change 0.62 %   Socioeconomic Pre 20.5 %   Socioeconomic Post 20.88 %   Socioeconomic % Change  1.85 %   Psych/Spiritual Pre 20.71 %   Psych/Spiritual Post 21 %   Psych/Spiritual % Change 1.4 %   Family Pre 21 %   Family Post 21 %   Family % Change 0 %   GLOBAL Pre 20.77 %   GLOBAL Post 15 %   GLOBAL % Change -27.78 %      Personal Goals: Goals established at orientation with interventions provided to work toward goal.     Personal Goals and Risk Factors at Admission - 06/02/16 1431       Core Components/Risk Factors/Patient Goals on Admission    Weight Management Yes;Weight Maintenance   Intervention Weight Management: Develop a combined nutrition and exercise program designed to reach desired caloric intake, while maintaining appropriate intake of nutrient and fiber, sodium and fats, and appropriate energy expenditure required for the weight goal.;Weight Management: Provide education and appropriate resources to help participant work on and attain dietary goals.   Expected Outcomes Weight Maintenance: Understanding of the daily nutrition guidelines, which includes 25-35% calories from fat, 7% or less cal from saturated fats, less than 200mg  cholesterol, less than 1.5gm of sodium, & 5 or more servings of fruits and vegetables daily   Increase Strength and Stamina Yes  Intervention Provide advice, education, support and counseling about physical activity/exercise needs.   Expected Outcomes Achievement of increased cardiorespiratory fitness and enhanced flexibility, muscular endurance and strength shown through measurements of functional capacity and personal statement of participant.   Hypertension Yes   Intervention Provide education on lifestyle modifcations including regular physical activity/exercise, weight management, moderate sodium restriction and increased consumption of fresh fruit, vegetables, and low fat dairy, alcohol moderation, and smoking cessation.;Monitor prescription use compliance.   Expected Outcomes Short Term: Continued assessment and intervention until BP is < 140/65mm HG in hypertensive participants. < 130/15mm HG in hypertensive participants with diabetes, heart failure or chronic kidney disease.;Long Term: Maintenance of blood pressure at goal levels.   Lipids Yes   Intervention Provide education and support for participant on nutrition & aerobic/resistive exercise along with prescribed medications to achieve LDL 70mg , HDL >40mg .   Expected Outcomes Short  Term: Participant states understanding of desired cholesterol values and is compliant with medications prescribed. Participant is following exercise prescription and nutrition guidelines.;Long Term: Cholesterol controlled with medications as prescribed, with individualized exercise RX and with personalized nutrition plan. Value goals: LDL < 70mg , HDL > 40 mg.   Stress Yes   Intervention Offer individual and/or small group education and counseling on adjustment to heart disease, stress management and health-related lifestyle change. Teach and support self-help strategies.;Refer participants experiencing significant psychosocial distress to appropriate mental health specialists for further evaluation and treatment. When possible, include family members and significant others in education/counseling sessions.   Expected Outcomes Short Term: Participant demonstrates changes in health-related behavior, relaxation and other stress management skills, ability to obtain effective social support, and compliance with psychotropic medications if prescribed.;Long Term: Emotional wellbeing is indicated by absence of clinically significant psychosocial distress or social isolation.       Personal Goals Discharge:     Goals and Risk Factor Review - 08/01/16 0907      Core Components/Risk Factors/Patient Goals Review   Personal Goals Review Weight Management/Obesity   Review Stedman would like to lose 7 lb from current weight.  He reports a gain of 2 lb since Thanksgiving.   He will schedule an appointment with an RD next week.   Expected Outcomes Esco will reach his weight loss goal and develop healthier dietary habits.      Nutrition & Weight - Outcomes:     Pre Biometrics - 06/02/16 1522      Pre Biometrics   Height 6' 0.2" (1.834 m)   Weight 234 lb 8 oz (106.4 kg)   Waist Circumference 41 inches   Hip Circumference 41 inches   Waist to Hip Ratio 1 %   BMI (Calculated) 31.7   Single Leg Stand 30  seconds         Post Biometrics - 08/14/16 1708       Post  Biometrics   Height 6' 0.2" (1.834 m)   Weight 228 lb (103.4 kg)   Waist Circumference 42.75 inches   Hip Circumference 42.75 inches   Waist to Hip Ratio 1 %   BMI (Calculated) 30.8   Single Leg Stand 30 seconds      Nutrition:     Nutrition Therapy & Goals - 06/02/16 1418      Intervention Plan   Intervention Prescribe, educate and counsel regarding individualized specific dietary modifications aiming towards targeted core components such as weight, hypertension, lipid management, diabetes, heart failure and other comorbidities.   Expected Outcomes Short Term Goal: Understand basic principles of dietary  content, such as calories, fat, sodium, cholesterol and nutrients.;Short Term Goal: A plan has been developed with personal nutrition goals set during dietitian appointment.;Long Term Goal: Adherence to prescribed nutrition plan.      Nutrition Discharge:     Nutrition Assessments - 08/18/16 1732      Rate Your Plate Scores   Pre Score 67   Pre Score % 80 %   Post Score 75   Post Score % 86 %   % Change 6 %      Education Questionnaire Score:     Knowledge Questionnaire Score - 08/18/16 1730      Knowledge Questionnaire Score   Pre Score 25/28   Post Score 26/28      Goals reviewed with patient; copy given to patient.

## 2016-08-19 ENCOUNTER — Other Ambulatory Visit: Payer: Self-pay

## 2016-08-19 ENCOUNTER — Emergency Department
Admission: EM | Admit: 2016-08-19 | Discharge: 2016-08-19 | Disposition: A | Discharge status: Home / Self Care | Payer: BLUE CROSS/BLUE SHIELD | Attending: Emergency Medicine | Admitting: Emergency Medicine

## 2016-08-19 ENCOUNTER — Encounter: Payer: Self-pay | Admitting: Emergency Medicine

## 2016-08-19 DIAGNOSIS — K219 Gastro-esophageal reflux disease without esophagitis: Secondary | ICD-10-CM | POA: Diagnosis not present

## 2016-08-19 DIAGNOSIS — Z79899 Other long term (current) drug therapy: Secondary | ICD-10-CM | POA: Insufficient documentation

## 2016-08-19 DIAGNOSIS — R0789 Other chest pain: Secondary | ICD-10-CM

## 2016-08-19 DIAGNOSIS — I10 Essential (primary) hypertension: Secondary | ICD-10-CM | POA: Insufficient documentation

## 2016-08-19 DIAGNOSIS — Z7982 Long term (current) use of aspirin: Secondary | ICD-10-CM | POA: Diagnosis not present

## 2016-08-19 HISTORY — DX: Acute myocardial infarction, unspecified: I21.9

## 2016-08-19 LAB — BASIC METABOLIC PANEL
ANION GAP: 9 (ref 5–15)
BUN: 17 mg/dL (ref 6–20)
CHLORIDE: 107 mmol/L (ref 101–111)
CO2: 21 mmol/L — AB (ref 22–32)
CREATININE: 1.12 mg/dL (ref 0.61–1.24)
Calcium: 9.2 mg/dL (ref 8.9–10.3)
GFR calc non Af Amer: >60 mL/min (ref >60)
Glucose, Bld: 105 mg/dL — ABNORMAL HIGH (ref 65–99)
Potassium: 3.6 mmol/L (ref 3.5–5.1)
Sodium: 137 mmol/L (ref 135–145)

## 2016-08-19 LAB — CBC
HCT: 39.2 % — ABNORMAL LOW (ref 40.0–52.0)
HEMOGLOBIN: 13.3 g/dL (ref 13.0–18.0)
MCH: 29.8 pg (ref 26.0–34.0)
MCHC: 34 g/dL (ref 32.0–36.0)
MCV: 87.7 fL (ref 80.0–100.0)
Platelets: 200 10*3/uL (ref 150–440)
RBC: 4.47 MIL/uL (ref 4.40–5.90)
RDW: 13.6 % (ref 11.5–14.5)
WBC: 9.7 10*3/uL (ref 3.8–10.6)

## 2016-08-19 LAB — TROPONIN I
Troponin I: <0.03 ng/mL (ref <0.03)
Troponin I: <0.03 ng/mL (ref <0.03)

## 2016-08-19 MED ORDER — FAMOTIDINE 20 MG PO TABS: 20.0000 mg | ORAL_TABLET | Freq: Two times a day (BID) | ORAL | 0 refills | Status: DC

## 2016-08-19 NOTE — ED Notes (Signed)
Pt states about week lift organ with four other people concerned pulled muscle causing pain. Pt states today notices burning in mid chest like after his MI. Pt states hasnt being taking carafate regular because its so big. Pt reports pain 1/10 to left upper chest stabbing comes and goes for the last two days.

## 2016-08-19 NOTE — ED Triage Notes (Signed)
Patient presents to the ED with left sided chest discomfort intermittently x 2 days.  Patient states, "It kind of feels like a pulled muscle, it is worse when I'm doing anything."  Patient reports heart attack several months ago.  Patient is in no obvious distress at this time.

## 2016-08-19 NOTE — Progress Notes (Signed)
Discharge Summary  Patient Details  Name: Carlos Diaz MRN: [id number] Date of Birth: [birth year] Referring Provider:   Flowsheet Row Cardiac Rehab from 06/02/2016 in [facility]  Referring Provider  Neoma Laming MD       Number of Visits: 36  Reason for Discharge:  Patient reached a stable level of exercise. Patient independent in their exercise.  Smoking History:  History  Smoking Status  . Never Smoker  Smokeless Tobacco  . Never Used    Diagnosis:  NSTEMI (non-ST elevated myocardial infarction) ([name])  Status post coronary artery stent placement  ADL [facility]:   Initial Exercise Prescription:     Initial Exercise Prescription - 06/02/16 1500      Date of Initial Exercise RX and Referring Provider   Date 06/02/16   Referring Provider Neoma Laming MD     Treadmill   MPH 3.5   Grade 2   Minutes 15   METs 4.64     Elliptical   Level 2   Speed 4.6   Minutes 15     REL-XR   Level 4   Watts --  speed 50 rpm   Minutes 15   METs 4.5     Prescription Details   Frequency (times per week) 3   Duration Progress to 45 minutes of aerobic exercise without signs/symptoms of physical distress     Intensity   THRR 40-80% of Max Heartrate 106-144   Ratings of Perceived Exertion 11-13   Perceived Dyspnea 0-4     Progression   Progression Continue to progress workloads to maintain intensity without signs/symptoms of physical distress.     Resistance Training   Training Prescription Yes   Weight 5 lbs   Reps 10-12      Discharge Exercise Prescription (Final Exercise Prescription Changes):     Exercise Prescription Changes - 08/14/16 1000      Exercise Review   Progression Yes     Response to Exercise   Blood Pressure (Admit) 142/84   Blood Pressure (Exercise) 146/78   Blood Pressure (Exit) 110/62   Heart Rate (Admit) 71 bpm   Heart Rate (Exercise) 135 bpm   Heart Rate (Exit) 53 bpm   Symptoms none     Progression   Progression Continue to progress workloads to maintain intensity without signs/symptoms of physical distress.   Average METs 4.54     Resistance Training   Training Prescription Yes   Weight 10   Reps 10-12     Interval Training   Interval Training Yes     Treadmill   MPH 3   Grade 3   Minutes 15   METs 4.54     Elliptical   Level 2   Speed 4.6   Minutes 15      Functional Capacity:     [name] Name 06/02/16 1513 08/14/16 1709       6 Minute Walk   Phase Initial Discharge    Distance 1900 feet 2190 feet    Distance % Change  - 15 %    Walk Time 6 minutes 6 minutes    # of Rest Breaks 0 0    MPH 3.6 4.14    METS 4.53 5.6    RPE 11 2    VO2 Peak 15.84 19.6    Symptoms No No    Resting HR 68 bpm 102 bpm    Resting BP 122/70 122/84    Max  Ex. HR 108 bpm 122 bpm    Max Ex. BP 130/76 172/80    2 Minute Post BP 126/70  -       Psychological, QOL, Others - Outcomes: PHQ 2/9: Depression screen [facility] 2/9 08/18/2016 06/02/2016  Decreased Interest 0 2  Down, Depressed, Hopeless 1 1  PHQ - 2 Score 1 3  Altered sleeping 0 1  Tired, decreased energy 0 0  Change in appetite 0 0  Feeling bad or failure about yourself  1 2  Trouble concentrating 0 1  Moving slowly or fidgety/restless 0 0  Suicidal thoughts 0 0  PHQ-9 Score 2 7  Difficult doing work/chores Not difficult at all Not difficult at all    Quality of Life:     Quality of Life - 08/18/16 1730      Quality of Life Scores   Health/Function Pre 20.87 %   Health/Function Post 21 %   Health/Function % Change 0.62 %   Socioeconomic Pre 20.5 %   Socioeconomic Post 20.88 %   Socioeconomic % Change  1.85 %   Psych/Spiritual Pre 20.71 %   Psych/Spiritual Post 21 %   Psych/Spiritual % Change 1.4 %   Family Pre 21 %   Family Post 21 %   Family % Change 0 %   GLOBAL Pre 20.77 %   GLOBAL Post 15 %   GLOBAL % Change -27.78 %      Personal Goals: Goals established at orientation with  interventions provided to work toward goal.     Personal Goals and Risk Factors at Admission - 06/02/16 1431      Core Components/Risk Factors/Patient Goals on Admission    Weight Management Yes;Weight Maintenance   Intervention Weight Management: Develop a combined nutrition and exercise program designed to reach desired caloric intake, while maintaining appropriate intake of nutrient and fiber, sodium and fats, and appropriate energy expenditure required for the weight goal.;Weight Management: Provide education and appropriate resources to help participant work on and attain dietary goals.   Expected Outcomes Weight Maintenance: Understanding of the daily nutrition guidelines, which includes 25-35% calories from fat, 7% or less cal from saturated fats, less than 200mg  cholesterol, less than 1.5gm of sodium, & 5 or more servings of fruits and vegetables daily   Increase Strength and Stamina Yes   Intervention Provide advice, education, support and counseling about physical activity/exercise needs.   Expected Outcomes Achievement of increased cardiorespiratory fitness and enhanced flexibility, muscular endurance and strength shown through measurements of functional capacity and personal statement of participant.   Hypertension Yes   Intervention Provide education on lifestyle modifcations including regular physical activity/exercise, weight management, moderate sodium restriction and increased consumption of fresh fruit, vegetables, and low fat dairy, alcohol moderation, and smoking cessation.;Monitor prescription use compliance.   Expected Outcomes Short Term: Continued assessment and intervention until BP is < 140/31mm HG in hypertensive participants. < 130/54mm HG in hypertensive participants with diabetes, heart failure or chronic kidney disease.;Long Term: Maintenance of blood pressure at goal levels.   Lipids Yes   Intervention Provide education and support for participant on nutrition &  aerobic/resistive exercise along with prescribed medications to achieve LDL 70mg , HDL >40mg .   Expected Outcomes Short Term: Participant states understanding of desired cholesterol values and is compliant with medications prescribed. Participant is following exercise prescription and nutrition guidelines.;Long Term: Cholesterol controlled with medications as prescribed, with individualized exercise RX and with personalized nutrition plan. Value goals: LDL < 70mg , HDL > 40 mg.  Stress Yes   Intervention Offer individual and/or small group education and counseling on adjustment to heart disease, stress management and health-related lifestyle change. Teach and support self-help strategies.;Refer participants experiencing significant psychosocial distress to appropriate mental health specialists for further evaluation and treatment. When possible, include family members and significant others in education/counseling sessions.   Expected Outcomes Short Term: Participant demonstrates changes in health-related behavior, relaxation and other stress management skills, ability to obtain effective social support, and compliance with psychotropic medications if prescribed.;Long Term: Emotional wellbeing is indicated by absence of clinically significant psychosocial distress or social isolation.       Personal Goals Discharge:     Goals and Risk Factor Review    Row Name 07/17/16 1707 08/01/16 [id number] 08/01/16 0907         Core Components/Risk Factors/Patient Goals Review   Personal Goals Review Weight Management/Obesity;Hypertension Weight Management/Obesity;Hypertension Weight Management/Obesity     Review Athony would like  to lose 10 lb.  he put on a couple lb during vacation but has lost 2 since.  He would like to meet with RD to learn healthy weight loss.  BP has been good. Lashaun would like to lose 7 lb from where he is now.  He is up 2 lb from Thanksgiving.  He will schedule an appointment with the RD next  week.  His BP has been controlled. Law would like to lose 7 lb from current weight.  He reports a gain of 2 lb since Thanksgiving.   He will schedule an appointment with an RD next week.     Expected Outcomes  - Tykevious will make healthy changes to his diet and reach his desired weight.   Blaydon will reach his weight loss goal and develop healthier dietary habits.        Nutrition & Weight - Outcomes:     Pre Biometrics - 06/02/16 1522      Pre Biometrics   Height 6' 0.2" (1.834 m)   Weight 234 lb 8 oz (106.4 kg)   Waist Circumference 41 inches   Hip Circumference 41 inches   Waist to Hip Ratio 1 %   BMI (Calculated) 31.7   Single Leg Stand 30 seconds         Post Biometrics - 08/14/16 1708       Post  Biometrics   Height 6' 0.2" (1.834 m)   Weight 228 lb (103.4 kg)   Waist Circumference 42.75 inches   Hip Circumference 42.75 inches   Waist to Hip Ratio 1 %   BMI (Calculated) 30.8   Single Leg Stand 30 seconds      Nutrition:     Nutrition Therapy & Goals - 06/02/16 1418      Intervention Plan   Intervention Prescribe, educate and counsel regarding individualized specific dietary modifications aiming towards targeted core components such as weight, hypertension, lipid management, diabetes, heart failure and other comorbidities.   Expected Outcomes Short Term Goal: Understand basic principles of dietary content, such as calories, fat, sodium, cholesterol and nutrients.;Short Term Goal: A plan has been developed with personal nutrition goals set during dietitian appointment.;Long Term Goal: Adherence to prescribed nutrition plan.      Nutrition Discharge:     Nutrition Assessments - 08/18/16 1732      Rate Your Plate Scores   Pre Score 67   Pre Score % 80 %   Post Score 75   Post Score % 86 %   % Change 6 %  Education Questionnaire Score:     Knowledge Questionnaire Score - 08/18/16 1730      Knowledge Questionnaire Score   Pre Score 25/28   Post  Score 26/28      Goals reviewed with patient; copy given to patient.

## 2016-08-19 NOTE — Progress Notes (Signed)
Cardiac Individual Treatment Plan  Patient Details  Name: Cary Brickhouse MRN: [id number] Date of Birth: 1961/08/09 Referring Provider:   Flowsheet Row Cardiac Rehab from 06/02/2016 in [facility] Cardiac and Pulmonary Rehab  Referring Provider  Neoma Laming MD      Initial Encounter Date:  Flowsheet Row Cardiac Rehab from 06/02/2016 in [facility] Cardiac and Pulmonary Rehab  Date  06/02/16  Referring Provider  Neoma Laming MD      Visit Diagnosis: NSTEMI (non-ST elevated myocardial infarction) [facility])  Status post coronary artery stent placement  Patient's Home Medications on Admission:  Current Outpatient Prescriptions:  .  aspirin 81 MG chewable tablet, Chew 1 tablet (81 mg total) by mouth daily., Disp: 30 tablet, Rfl: 1 .  atorvastatin (LIPITOR) 40 MG tablet, Take 1 tablet (40 mg total) by mouth daily., Disp: 30 tablet, Rfl: 1 .  citalopram (CELEXA) 20 MG tablet, Take by mouth., Disp: , Rfl:  .  clonazePAM (KLONOPIN) 0.5 MG tablet, Take by mouth., Disp: , Rfl:  .  ranolazine (RANEXA) 500 MG 12 hr tablet, Take by mouth., Disp: , Rfl:  .  sucralfate (CARAFATE) 1 g tablet, Take by mouth., Disp: , Rfl:  .  ticagrelor (BRILINTA) 90 MG TABS tablet, Take 1 tablet (90 mg total) by mouth 2 (two) times daily., Disp: 60 tablet, Rfl: 2  Past Medical History: Past Medical History:  Diagnosis Date  . Anxiety   . Hypertension   . Lyme disease     Tobacco Use: History  Smoking Status  . Never Smoker  Smokeless Tobacco  . Never Used    Labs: Recent Review Flowsheet Data    Labs for ITP Cardiac and Pulmonary Rehab Latest Ref Rng & Units 04/14/2016   Cholestrol 0 - 200 mg/dL 192   LDLCALC 0 - 99 mg/dL 126(H)   HDL >40 mg/dL 35(L)   Trlycerides <150 mg/dL 154(H)   Hemoglobin A1c 4.0 - 6.0 % 5.2       Exercise Target Goals:    Exercise Program Goal: Individual exercise prescription set with THRR, safety & activity barriers. Participant demonstrates ability to understand and report RPE  using BORG scale, to self-measure pulse accurately, and to acknowledge the importance of the exercise prescription.  Exercise Prescription Goal: Starting with aerobic activity 30 plus minutes a day, 3 days per week for initial exercise prescription. Provide home exercise prescription and guidelines that participant acknowledges understanding prior to discharge.  Activity Barriers & Risk Stratification:     Activity Barriers & Cardiac Risk Stratification - 06/02/16 1430      Activity Barriers & Cardiac Risk Stratification   Activity Barriers Joint Problems  Both hands with joint pain with gripping and use of hands. History of Lyme Disease.  Previously dislocated shoulder   Cardiac Risk Stratification High      6 Minute Walk:     6 Minute Walk    Row Name 06/02/16 1513 08/14/16 1709       6 Minute Walk   Phase Initial Discharge    Distance 1900 feet 2190 feet    Distance % Change  - 15 %    Walk Time 6 minutes 6 minutes    # of Rest Breaks 0 0    MPH 3.6 4.14    METS 4.53 5.6    RPE 11 2    VO2 Peak 15.84 19.6    Symptoms No No    Resting HR 68 bpm 102 bpm    Resting BP 122/70 122/84  Max Ex. HR 108 bpm 122 bpm    Max Ex. BP 130/76 172/80    2 Minute Post BP 126/70  -       Initial Exercise Prescription:     Initial Exercise Prescription - 06/02/16 1500      Date of Initial Exercise RX and Referring Provider   Date 06/02/16   Referring Provider Neoma Laming MD     Treadmill   MPH 3.5   Grade 2   Minutes 15   METs 4.64     Elliptical   Level 2   Speed 4.6   Minutes 15     REL-XR   Level 4   Watts --  speed 50 rpm   Minutes 15   METs 4.5     Prescription Details   Frequency (times per week) 3   Duration Progress to 45 minutes of aerobic exercise without signs/symptoms of physical distress     Intensity   THRR 40-80% of Max Heartrate 106-144   Ratings of Perceived Exertion 11-13   Perceived Dyspnea 0-4     Progression   Progression  Continue to progress workloads to maintain intensity without signs/symptoms of physical distress.     Resistance Training   Training Prescription Yes   Weight 5 lbs   Reps 10-12      Perform Capillary Blood Glucose checks as needed.  Exercise Prescription Changes:     Exercise Prescription Changes    Row Name 06/02/16 1500 06/19/16 1100 07/02/16 1200 07/14/16 1800 07/17/16 1100     Exercise Review   Progression -  walk test results Yes Yes  - Yes     Response to Exercise   Blood Pressure (Admit) 122/70 122/78 124/72  - 124/82   Blood Pressure (Exercise) 130/76 172/86 150/84  - 156/80   Blood Pressure (Exit) 126/70 140/70 138/80  - 110/62   Heart Rate (Admit) 68 bpm 70 bpm 68 bpm  - 70 bpm   Heart Rate (Exercise) 108 bpm 116 bpm 118 bpm  - 142 bpm   Heart Rate (Exit) 71 bpm 78 bpm 78 bpm  - 46 bpm   Oxygen Saturation (Admit) 96 %  -  -  -  -   Oxygen Saturation (Exit) 100 %  -  -  -  -   Rating of Perceived Exertion (Exercise) 11  -  -  -  -   Symptoms none none none  - none     Progression   Progression  - Continue to progress workloads to maintain intensity without signs/symptoms of physical distress. Continue to progress workloads to maintain intensity without signs/symptoms of physical distress. Continue to progress workloads to maintain intensity without signs/symptoms of physical distress. Continue to progress workloads to maintain intensity without signs/symptoms of physical distress.   Average METs  - 4.23 4.65 4.65  -     Resistance Training   Training Prescription  - Yes Yes Yes Yes   Weight  - 4 4 4 4    Reps  - 10-12 10-12 10-12 10-12     Interval Training   Interval Training  - No No No No     Treadmill   MPH  - 3.5 3.5 3.5 3.5   Grade  - 2 2 2 2    Minutes  - 15 15 15 15    METs  - 4.65 4.65 4.65 4.65     Elliptical   Level  -  - 2 2 2    Speed  -  -  4.6 4.6 4.6   Minutes  -  - 15 15 15      REL-XR   Level  - 4  -  -  -   Minutes  - 15  -  -  -    METs  - 3.8  -  -  -     Home Exercise Plan   Plans to continue exercise at  -  -  - Home  -   Frequency  -  -  - Add 2 additional days to program exercise sessions.  walking   -   Row Name 07/30/16 1400 07/31/16 1700 08/14/16 1000         Exercise Review   Progression  -  - Yes       Response to Exercise   Blood Pressure (Admit) 142/80  - 142/84     Blood Pressure (Exercise) 172/80  - 146/78     Blood Pressure (Exit) 124/82  - 110/62     Heart Rate (Admit) 95 bpm  - 71 bpm     Heart Rate (Exercise) 124 bpm  - 135 bpm     Heart Rate (Exit) 78 bpm  - 53 bpm     Symptoms  -  - none       Progression   Progression Continue to progress workloads to maintain intensity without signs/symptoms of physical distress.  - Continue to progress workloads to maintain intensity without signs/symptoms of physical distress.     Average METs 4.65  - 4.54       Resistance Training   Training Prescription Yes  - Yes     Weight 10  - 10     Reps 8-10  - 10-12       Interval Training   Interval Training No  - Yes       Treadmill   MPH 3.5  - 3     Grade 2  - 3     Minutes 15  - 15     METs 4.65  - 4.54       Elliptical   Level  -  - 2     Speed  -  - 4.6     Minutes  -  - 15       REL-XR   Level 9  -  -     Minutes 15  -  -       Home Exercise Plan   Plans to continue exercise at  [facility] (comment)  -     Frequency  - -  [employer]  -        Exercise Comments:     Exercise Comments    Row Name 06/02/16 1520 06/11/16 1800 06/19/16 1121 07/02/16 1243 07/14/16 1809   Exercise Comments Exercise goal is to be able to return to his previous exercise routine with confidence (swimming and lifting) First full day of exercise! Ladavion was oriented to gym and equipment including functions, settings, policies, and procedures.  Teaghan's individual exercise prescription and treatment plan were reviewed.  All starting workloads were established based on the results of the 6 minute walk  test done at initial orientation visit.  The plan for exercise progression was also introduced and progression will be customized based on patient's performance and goals. Story is progressing well with exercise. Harvir continues to progress well with exercise. Home exercise guidelines reviewed with patient. Patient demonstrated understanding of these guidelines and plans to  incorporate more activity in at home.    [employer] Name 07/17/16 1137 07/30/16 1405 08/14/16 1054 08/18/16 1814     Exercise Comments [name] i sporgressing well and will be adding speed to TM and intervals on XR. Dekker is progressing well with exercise. Ashai has done well adding intervals to the elliptical and XR.  He will graduate soon and plans to use the gym at [employer]. Akaash graduated today from cardiac rehab with 36 sessions completed.  Details of the patient's exercise prescription and what He needs to do in order to continue the prescription and progress were discussed with patient.  Patient was given a copy of prescription and goals.  Patient verbalized understanding.  Terril plans to continue to exercise by using fitness facility at his job.       Discharge Exercise Prescription (Final Exercise Prescription Changes):     Exercise Prescription Changes - 08/14/16 1000      Exercise Review   Progression Yes     Response to Exercise   Blood Pressure (Admit) 142/84   Blood Pressure (Exercise) 146/78   Blood Pressure (Exit) 110/62   Heart Rate (Admit) 71 bpm   Heart Rate (Exercise) 135 bpm   Heart Rate (Exit) 53 bpm   Symptoms none     Progression   Progression Continue to progress workloads to maintain intensity without signs/symptoms of physical distress.   Average METs 4.54     Resistance Training   Training Prescription Yes   Weight 10   Reps 10-12     Interval Training   Interval Training Yes     Treadmill   MPH 3   Grade 3   Minutes 15   METs 4.54     Elliptical   Level 2   Speed 4.6   Minutes 15       Nutrition:  Target Goals: Understanding of nutrition guidelines, daily intake of sodium 1500mg , cholesterol 200mg , calories 30% from fat and 7% or less from saturated fats, daily to have 5 or more servings of fruits and vegetables.  Biometrics:     Pre Biometrics - 06/02/16 1522      Pre Biometrics   Height 6' 0.2" (1.834 m)   Weight 234 lb 8 oz (106.4 kg)   Waist Circumference 41 inches   Hip Circumference 41 inches   Waist to Hip Ratio 1 %   BMI (Calculated) 31.7   Single Leg Stand 30 seconds         Post Biometrics - 08/14/16 1708       Post  Biometrics   Height 6' 0.2" (1.834 m)   Weight 228 lb (103.4 kg)   Waist Circumference 42.75 inches   Hip Circumference 42.75 inches   Waist to Hip Ratio 1 %   BMI (Calculated) 30.8   Single Leg Stand 30 seconds      Nutrition Therapy Plan and Nutrition Goals:     Nutrition Therapy & Goals - 06/02/16 1418      Intervention Plan   Intervention Prescribe, educate and counsel regarding individualized specific dietary modifications aiming towards targeted core components such as weight, hypertension, lipid management, diabetes, heart failure and other comorbidities.   Expected Outcomes Short Term Goal: Understand basic principles of dietary content, such as calories, fat, sodium, cholesterol and nutrients.;Short Term Goal: A plan has been developed with personal nutrition goals set during dietitian appointment.;Long Term Goal: Adherence to prescribed nutrition plan.      Nutrition Discharge: Rate Your Plate Scores:  Nutrition Assessments - 08/18/16 1732      Rate Your Plate Scores   Pre Score 67   Pre Score % 80 %   Post Score 75   Post Score % 86 %   % Change 6 %      Nutrition Goals Re-Evaluation:   Psychosocial: Target Goals: Acknowledge presence or absence of depression, maximize coping skills, provide positive support system. Participant is able to verbalize types and ability to use techniques and  skills needed for reducing stress and depression.  Initial Review & Psychosocial Screening:     Initial Psych Review & Screening - 06/02/16 1424      Initial Review   Current issues with Current Depression;Current Anxiety/Panic;Current Psychotropic Meds;Current Stress Concerns   Source of Stress Concerns Chronic Illness;Financial  Stress caused by chronic illness that went years before diagnosed.  treatment expensive and traveled all over the [facility] to find a physician that could diagnose the Lyme Diseaset hat was causing all of his symptoms.    Comments Used savings up that was planned for early retirement.  Wife has same diagnosis of Lyme Disease and again spemt money for treatment and MD visits.     Family Dynamics   Good Support System? Yes   Concerns Recent loss of child   Comments Son killed last year,hit by vehicle. This weighs daily on Dexten     Barriers   Psychosocial barriers to participate in program There are no identifiable barriers or psychosocial needs.     Screening Interventions   Interventions Encouraged to exercise      Quality of Life Scores:     Quality of Life - 08/18/16 1730      Quality of Life Scores   Health/Function Pre 20.87 %   Health/Function Post 21 %   Health/Function % Change 0.62 %   Socioeconomic Pre 20.5 %   Socioeconomic Post 20.88 %   Socioeconomic % Change  1.85 %   Psych/Spiritual Pre 20.71 %   Psych/Spiritual Post 21 %   Psych/Spiritual % Change 1.4 %   Family Pre 21 %   Family Post 21 %   Family % Change 0 %   GLOBAL Pre 20.77 %   GLOBAL Post 15 %   GLOBAL % Change -27.78 %      PHQ-9: Recent Review Flowsheet Data    Depression screen [facility] 2/9 08/18/2016 06/02/2016   Decreased Interest 0 2   Down, Depressed, Hopeless 1 1   PHQ - 2 Score 1 3   Altered sleeping 0 1   Tired, decreased energy 0 0   Change in appetite 0 0   Feeling bad or failure about yourself  1 2   Trouble concentrating 0 1   Moving slowly or  fidgety/restless 0 0   Suicidal thoughts 0 0   PHQ-9 Score 2 7   Difficult doing work/chores Not difficult at all Not difficult at all      Psychosocial Evaluation and Intervention:     Psychosocial Evaluation - 08/18/16 1742      Discharge Psychosocial Assessment & Intervention   Comments Counselor follow up with Mr. D as he is close to completing this program.  He reports feeling more confident about working out and getting back to his "normal life."  He states he has more stamina and strength since coming into this program as well.  He has been able to return to work full-time and will be working out at [employer] there;  swimming and using weights as a follow up program to continue exercising consistently. Mr. D continues to having difficulty with intermittent sleep and has not yet looked into some natural OTC as suggested or even consulting with his Dr. about a possible sleep study.  Counselor reiterated this to him and the importance of good rest for his overall health.  He agreed to do so.  Counselor also recommended Mr. D continue with his individual counseling due to the holidays being so difficult with the tragic loss of his son several years ago.  He admits to "struggling" more currently with his daughter home from college and looking so "lost" without her brother since they were such good "pals" before.  Mr. D states he is continuing to take the increased Celexa and he reports no further panic symptoms since he began taking this curent dose.  Counselor commended Mr. D on his progress made and his improved self-care since coming into this program.        Psychosocial Re-Evaluation:   Vocational Rehabilitation: Provide vocational rehab assistance to qualifying candidates.   Vocational Rehab Evaluation & Intervention:     Vocational Rehab - 06/02/16 1431      Initial Vocational Rehab Evaluation & Intervention   Assessment shows need for Vocational Rehabilitation No       Education: Education Goals: Education classes will be provided on a weekly basis, covering required topics. Participant will state understanding/return demonstration of topics presented.  Learning Barriers/Preferences:     Learning Barriers/Preferences - 06/02/16 1431      Learning Barriers/Preferences   Learning Barriers None   Learning Preferences Audio;Individual Instruction;Verbal Instruction      Education Topics: General Nutrition Guidelines/Fats and Fiber: -Group instruction provided by verbal, written material, models and posters to present the general guidelines for heart healthy nutrition. Gives an explanation and review of dietary fats and fiber. Flowsheet Row Cardiac Rehab from 08/18/2016 in [facility] Cardiac and Pulmonary Rehab  Date  07/14/16  Educator  SB  Instruction Review Code  2- meets goals/outcomes      Controlling Sodium/Reading Food Labels: -Group verbal and written material supporting the discussion of sodium use in heart healthy nutrition. Review and explanation with models, verbal and written materials for utilization of the food label. Flowsheet Row Cardiac Rehab from 08/18/2016 in [facility] Cardiac and Pulmonary Rehab  Date  07/21/16  Educator  PI  Instruction Review Code  2- meets goals/outcomes      Exercise Physiology & Risk Factors: - Group verbal and written instruction with models to review the exercise physiology of the cardiovascular system and associated critical values. Details cardiovascular disease risk factors and the goals associated with each risk factor.   Aerobic Exercise & Resistance Training: - Gives group verbal and written discussion on the health impact of inactivity. On the components of aerobic and resistive training programs and the benefits of this training and how to safely progress through these programs. Flowsheet Row Cardiac Rehab from 08/18/2016 in [facility] Cardiac and Pulmonary Rehab  Date  07/28/16  Educator  K. Amedeo Plenty   Instruction Review Code  2- meets goals/outcomes      Flexibility, Balance, General Exercise Guidelines: - Provides group verbal and written instruction on the benefits of flexibility and balance training programs. Provides general exercise guidelines with specific guidelines to those with heart or lung disease. Demonstration and skill practice provided. Flowsheet Row Cardiac Rehab from 08/18/2016 in [facility] Cardiac and Pulmonary Rehab  Date  07/30/16  Educator  AS  Instruction Review Code  2- meets goals/outcomes      Stress Management: - Provides group verbal and written instruction about the health risks of elevated stress, cause of high stress, and healthy ways to reduce stress. Flowsheet Row Cardiac Rehab from 08/18/2016 in [facility] Cardiac and Pulmonary Rehab  Date  08/13/16  Educator  CK  Instruction Review Code  2- meets goals/outcomes      Depression: - Provides group verbal and written instruction on the correlation between heart/lung disease and depressed mood, treatment options, and the stigmas associated with seeking treatment. Flowsheet Row Cardiac Rehab from 08/18/2016 in [facility] Cardiac and Pulmonary Rehab  Date  07/16/16  Educator  Berle Mull, MSW  Instruction Review Code  2- meets goals/outcomes      Anatomy & Physiology of the Heart: - Group verbal and written instruction and models provide basic cardiac anatomy and physiology, with the coronary electrical and arterial systems. Review of: AMI, Angina, Valve disease, Heart Failure, Cardiac Arrhythmia, Pacemakers, and the ICD. Flowsheet Row Cardiac Rehab from 08/18/2016 in [facility] Cardiac and Pulmonary Rehab  Date  08/04/16  Educator  SB  Instruction Review Code  2- meets goals/outcomes      Cardiac Procedures: - Group verbal and written instruction and models to describe the testing methods done to diagnose heart disease. Reviews the outcomes of the test results. Describes the treatment choices: Medical Management,  Angioplasty, or Coronary Bypass Surgery. Flowsheet Row Cardiac Rehab from 08/18/2016 in [facility] Cardiac and Pulmonary Rehab  Date  08/11/16  Educator  CE  Instruction Review Code  2- meets goals/outcomes      Cardiac Medications: - Group verbal and written instruction to review commonly prescribed medications for heart disease. Reviews the medication, class of the drug, and side effects. Includes the steps to properly store meds and maintain the prescription regimen. Flowsheet Row Cardiac Rehab from 08/18/2016 in [facility] Cardiac and Pulmonary Rehab  Date  08/18/16  Educator  S. Sharone Picchi, RN  Instruction Review Code  2- meets goals/outcomes      Go Sex-Intimacy & Heart Disease, Get SMART - Goal Setting: - Group verbal and written instruction through game format to discuss heart disease and the return to sexual intimacy. Provides group verbal and written material to discuss and apply goal setting through the application of the S.M.A.R.T. Method. Flowsheet Row Cardiac Rehab from 08/18/2016 in [facility] Cardiac and Pulmonary Rehab  Date  08/11/16  Educator  CE  Instruction Review Code  2- meets goals/outcomes      Other Matters of the Heart: - Provides group verbal, written materials and models to describe Heart Failure, Angina, Valve Disease, and Diabetes in the realm of heart disease. Includes description of the disease process and treatment options available to the cardiac patient. Flowsheet Row Cardiac Rehab from 08/18/2016 in [facility] Cardiac and Pulmonary Rehab  Date  08/04/16  Educator  SB  Instruction Review Code  2- meets goals/outcomes      Exercise & Equipment Safety: - Individual verbal instruction and demonstration of equipment use and safety with use of the equipment. Flowsheet Row Cardiac Rehab from 08/18/2016 in [facility] Cardiac and Pulmonary Rehab  Date  06/02/16  Educator  SB  Instruction Review Code  2- meets goals/outcomes      Infection Prevention: - Provides verbal and written  material to individual with discussion of infection control including proper hand washing and proper equipment cleaning during exercise session. Flowsheet Row Cardiac Rehab from 08/18/2016 in [facility] Cardiac and Pulmonary Rehab  Date  06/02/16  Educator  SB  Instruction Review Code  2- meets goals/outcomes      Falls Prevention: - Provides verbal and written material to individual with discussion of falls prevention and safety.   Diabetes: - Individual verbal and written instruction to review signs/symptoms of diabetes, desired ranges of glucose level fasting, after meals and with exercise. Advice that pre and post exercise glucose checks will be done for 3 sessions at entry of program.    Knowledge Questionnaire Score:     Knowledge Questionnaire Score - 08/18/16 1730      Knowledge Questionnaire Score   Pre Score 25/28   Post Score 26/28      Core Components/Risk Factors/Patient Goals at Admission:     Personal Goals and Risk Factors at Admission - 06/02/16 1431      Core Components/Risk Factors/Patient Goals on Admission    Weight Management Yes;Weight Maintenance   Intervention Weight Management: Develop a combined nutrition and exercise program designed to reach desired caloric intake, while maintaining appropriate intake of nutrient and fiber, sodium and fats, and appropriate energy expenditure required for the weight goal.;Weight Management: Provide education and appropriate resources to help participant work on and attain dietary goals.   Expected Outcomes Weight Maintenance: Understanding of the daily nutrition guidelines, which includes 25-35% calories from fat, 7% or less cal from saturated fats, less than 200mg  cholesterol, less than 1.5gm of sodium, & 5 or more servings of fruits and vegetables daily   Increase Strength and Stamina Yes   Intervention Provide advice, education, support and counseling about physical activity/exercise needs.   Expected Outcomes  Achievement of increased cardiorespiratory fitness and enhanced flexibility, muscular endurance and strength shown through measurements of functional capacity and personal statement of participant.   Hypertension Yes   Intervention Provide education on lifestyle modifcations including regular physical activity/exercise, weight management, moderate sodium restriction and increased consumption of fresh fruit, vegetables, and low fat dairy, alcohol moderation, and smoking cessation.;Monitor prescription use compliance.   Expected Outcomes Short Term: Continued assessment and intervention until BP is < 140/61mm HG in hypertensive participants. < 130/36mm HG in hypertensive participants with diabetes, heart failure or chronic kidney disease.;Long Term: Maintenance of blood pressure at goal levels.   Lipids Yes   Intervention Provide education and support for participant on nutrition & aerobic/resistive exercise along with prescribed medications to achieve LDL 70mg , HDL >40mg .   Expected Outcomes Short Term: Participant states understanding of desired cholesterol values and is compliant with medications prescribed. Participant is following exercise prescription and nutrition guidelines.;Long Term: Cholesterol controlled with medications as prescribed, with individualized exercise RX and with personalized nutrition plan. Value goals: LDL < 70mg , HDL > 40 mg.   Stress Yes   Intervention Offer individual and/or small group education and counseling on adjustment to heart disease, stress management and health-related lifestyle change. Teach and support self-help strategies.;Refer participants experiencing significant psychosocial distress to appropriate mental health specialists for further evaluation and treatment. When possible, include family members and significant others in education/counseling sessions.   Expected Outcomes Short Term: Participant demonstrates changes in health-related behavior, relaxation and  other stress management skills, ability to obtain effective social support, and compliance with psychotropic medications if prescribed.;Long Term: Emotional wellbeing is indicated by absence of clinically significant psychosocial distress or social isolation.      Core Components/Risk Factors/Patient Goals Review:      Goals and Risk Factor Review    Row Name 07/17/16 1707 08/01/16 [id number] 08/01/16 [phone]  Core Components/Risk Factors/Patient Goals Review   Personal Goals Review Weight Management/Obesity;Hypertension Weight Management/Obesity;Hypertension Weight Management/Obesity     Review Patty would like  to lose 10 lb.  he put on a couple lb during vacation but has lost 2 since.  He would like to meet with RD to learn healthy weight loss.  BP has been good. Ossian would like to lose 7 lb from where he is now.  He is up 2 lb from Thanksgiving.  He will schedule an appointment with the RD next week.  His BP has been controlled. Bahe would like to lose 7 lb from current weight.  He reports a gain of 2 lb since Thanksgiving.   He will schedule an appointment with an RD next week.     Expected Outcomes  - Jaison will make healthy changes to his diet and reach his desired weight.   Tayen will reach his weight loss goal and develop healthier dietary habits.        Core Components/Risk Factors/Patient Goals at Discharge (Final Review):      Goals and Risk Factor Review - 08/01/16 0907      Core Components/Risk Factors/Patient Goals Review   Personal Goals Review Weight Management/Obesity   Review Mikhi would like to lose 7 lb from current weight.  He reports a gain of 2 lb since Thanksgiving.   He will schedule an appointment with an RD next week.   Expected Outcomes Sayon will reach his weight loss goal and develop healthier dietary habits.      ITP Comments:     ITP Comments    Row Name 06/02/16 1414 06/11/16 1116 07/09/16 0621 08/06/16 0619 08/19/16 1123   ITP Comments INitial  ITP created during medical review. diagnosis documentation can be found in [facility] encounter date 04/13/2016 30 day review. Continue with ITP unless changes noted by Medical Director at signature of review.  New to program 30 day review completed for Medical Director physician review and signature. Continue ITP unless changes made by physician. 30 day review completed for review by Dr Emily Filbert.  Continue with ITP unless changes noted by Dr Sabra Heck. Discharged      Comments:

## 2016-08-19 NOTE — ED Provider Notes (Signed)
[facility] Emergency Department Provider Note  ____________________________________________  Time seen: Approximately 10:06 PM  I have reviewed the triage vital signs and the nursing notes.   HISTORY  Chief Complaint Chest Pain    HPI Carlos Diaz is a 55 y.o. male who complains of 2 distinct chest discomfort symptoms. The first is an intermittent stabbing pain in the left upper chest. It is fleeting and comes and goes lasting for less than a second at a time. It feels sharp. He is not short of breath and diaphoretic or vomiting with it. It's nonradiating. Worse with movement, no alleviating factors. Not exertional. He's been doing cardiac rehabilitation without symptoms.  He also complains of a burning sensation in the center of his chest. Feels like GERD that he's had before. He's been prescribed Carafate for this but he hasn't been taking the tablets recently because he feels like they are too big. Again no exertional component. No vomiting diaphoresis or shortness of breath. Nonradiating. No dizziness or syncope.  Has a history of CAD and non-STEMI, followed by Dr. Humphrey Rolls of cardiology.     Past Medical History:  Diagnosis Date  . Anxiety   . Hypertension   . Lyme disease   . Myocardial infarction 04/13/2016     Patient Active Problem List   Diagnosis Date Noted  . Anxiety 06/02/2016  . Depression 06/02/2016  . Hyperlipidemia, unspecified 06/02/2016  . Hypertension 06/02/2016  . Lyme disease 06/02/2016  . NSTEMI (non-ST elevated myocardial infarction) ([place]) 04/13/2016     Past Surgical History:  Procedure Laterality Date  . CARDIAC CATHETERIZATION Right 04/14/2016   Procedure: Left Heart Cath and Coronary Angiography;  Surgeon: Dionisio Tevon, MD;  Location: [place] [facility];  Service: Cardiovascular;  Laterality: Right;  . CARDIAC CATHETERIZATION N/A 04/14/2016   Procedure: Coronary Stent Intervention;  Surgeon: Yolonda Kida, MD;   Location: [place] [facility];  Service: Cardiovascular;  Laterality: N/A;     Prior to Admission medications   Medication Sig Start Date End Date Taking? Authorizing Provider  aspirin 81 MG chewable tablet Chew 1 tablet (81 mg total) by mouth daily. 04/15/16   Fritzi Mandes, MD  atorvastatin (LIPITOR) 40 MG tablet Take 1 tablet (40 mg total) by mouth daily. 04/15/16   Fritzi Mandes, MD  citalopram (CELEXA) 20 MG tablet Take by mouth. 05/15/16 08/13/16  Historical Provider, MD  clonazePAM (KLONOPIN) 0.5 MG tablet Take by mouth. 05/15/16 06/14/16  Historical Provider, MD  famotidine (PEPCID) 20 MG tablet Take 1 tablet (20 mg total) by mouth 2 (two) times daily. 08/19/16   Carrie Mew, MD  ranolazine (RANEXA) 500 MG 12 hr tablet Take by mouth. 05/15/16 05/15/17  Historical Provider, MD  sucralfate (CARAFATE) 1 g tablet Take by mouth. 05/06/16   Historical Provider, MD  ticagrelor (BRILINTA) 90 MG TABS tablet Take 1 tablet (90 mg total) by mouth 2 (two) times daily. 04/15/16   Fritzi Mandes, MD     Allergies Patient has no known allergies.   No family history on file.  Social History Social History  Substance Use Topics  . Smoking status: Never Smoker  . Smokeless tobacco: Never Used  . Alcohol use No    Review of Systems  Constitutional:   No fever or chills.  ENT:   No sore throat. No rhinorrhea. Cardiovascular:   Positive as above chest pain. Respiratory:   No dyspnea or cough. Gastrointestinal:   Negative for abdominal pain, vomiting and diarrhea.  Genitourinary:  Negative for dysuria or difficulty urinating. Musculoskeletal:   Negative for focal pain or swelling Neurological:   Negative for headaches 10-point ROS otherwise negative.  ____________________________________________   PHYSICAL EXAM:  VITAL SIGNS: ED Triage Vitals  Enc Vitals Group     BP 08/19/16 1812 128/84     Pulse Rate 08/19/16 1812 78     Resp 08/19/16 1812 18     Temp 08/19/16 1812 98.2 F (36.8 C)      Temp Source 08/19/16 1812 Oral     SpO2 08/19/16 1812 98 %     Weight 08/19/16 1813 230 lb (104.3 kg)     Height 08/19/16 1813 6' (1.829 m)     Head Circumference --      Peak Flow --      Pain Score 08/19/16 1813 2     Pain Loc --      Pain Edu? --      Excl. in [place]? --     Vital signs reviewed, nursing assessments reviewed.   Constitutional:   Alert and oriented. Well appearing and in no distress. Eyes:   No scleral icterus. No conjunctival pallor. PERRL. EOMI.  No nystagmus. ENT   Head:   Normocephalic and atraumatic.   Nose:   No congestion/rhinnorhea. No septal hematoma   Mouth/Throat:   MMM, no pharyngeal erythema. No peritonsillar mass.    Neck:   No stridor. No SubQ emphysema. No meningismus. Hematological/Lymphatic/Immunilogical:   No cervical lymphadenopathy. Cardiovascular:   RRR. Symmetric bilateral radial and DP pulses.  No murmurs.  Respiratory:   Normal respiratory effort without tachypnea nor retractions. Breath sounds are clear and equal bilaterally. No wheezes/rales/rhonchi. Focal area of chest wall tenderness in the area of indicated pain which reproduces his symptoms. This is at about the third intercostal space in the left upper chest. Gastrointestinal:   Soft and nontender. Non distended. There is no CVA tenderness.  No rebound, rigidity, or guarding. Genitourinary:   deferred Musculoskeletal:   Nontender with normal range of motion in all extremities. No joint effusions.  No lower extremity tenderness.  No edema. Neurologic:   Normal speech and language.  CN 2-10 normal. Motor grossly intact. No gross focal neurologic deficits are appreciated.  Skin:    Skin is warm, dry and intact. No rash noted.  No petechiae, purpura, or bullae.  ____________________________________________    LABS (pertinent positives/negatives) (all labs ordered are listed, but only abnormal results are displayed) Labs Reviewed  BASIC METABOLIC PANEL - Abnormal; Notable  for the following:       Result Value   CO2 21 (*)    Glucose, Bld 105 (*)    All other components within normal limits  CBC - Abnormal; Notable for the following:    HCT 39.2 (*)    All other components within normal limits  TROPONIN I  TROPONIN I   ____________________________________________   EKG  Interpreted by me  Date: 08/19/2016  Rate: 74  Rhythm: normal sinus rhythm  QRS Axis: normal  Intervals: normal  ST/T Wave abnormalities: normal  Conduction Disutrbances: none  Narrative Interpretation: unremarkable      ____________________________________________    RADIOLOGY    ____________________________________________   PROCEDURES Procedures  ____________________________________________   INITIAL IMPRESSION / ASSESSMENT AND PLAN / ED COURSE  Pertinent labs & imaging results that were available during my care of the patient were reviewed by me and considered in my medical decision making (see chart for details).  Patient well appearing no  acute distress, presents with atypical chest pain symptoms, appears to have both a chest wall pain related to some intercostal strain from heavy lifting as well as GERD due to noncompliance with his Carafate. EKG unremarkable. Vital signs unremarkable labs unremarkable. Troponin negative 2.Considering the patient's symptoms, medical history, and physical examination today, I have low suspicion for ACS, PE, TAD, pneumothorax, carditis, mediastinitis, pneumonia, CHF, or sepsis.  Discharge home to follow up with primary care and cardiology. Continue Carafate. I'll add Pepcid.     Clinical Course    ____________________________________________   FINAL CLINICAL IMPRESSION(S) / ED DIAGNOSES  Final diagnoses:  Gastroesophageal reflux disease, esophagitis presence not specified  Chest wall pain  Atypical chest pain      New Prescriptions   FAMOTIDINE (PEPCID) 20 MG TABLET    Take 1 tablet (20 mg total) by mouth 2  (two) times daily.     Portions of this note were generated with dragon dictation software. Dictation errors may occur despite best attempts at proofreading.    Carrie Mew, MD 08/19/16 [phone]

## 2016-11-11 ENCOUNTER — Ambulatory Visit: Payer: Self-pay | Admitting: Medical

## 2016-11-12 ENCOUNTER — Encounter: Payer: Self-pay | Admitting: Medical

## 2016-11-12 ENCOUNTER — Ambulatory Visit: Payer: Self-pay | Admitting: Medical

## 2016-11-12 VITALS — BP 145/88 | HR 93 | Temp 98.2°F | Resp 16 | Ht 72.0 in | Wt 236.0 lb

## 2016-11-12 DIAGNOSIS — B354 Tinea corporis: Secondary | ICD-10-CM

## 2016-11-12 DIAGNOSIS — R21 Rash and other nonspecific skin eruption: Secondary | ICD-10-CM

## 2016-11-12 MED ORDER — CLOTRIMAZOLE-BETAMETHASONE 1-0.05 % EX CREA: 1.0000 "application " | TOPICAL_CREAM | Freq: Two times a day (BID) | CUTANEOUS | 0 refills | Status: AC

## 2016-11-12 MED ORDER — TRIAMCINOLONE ACETONIDE 0.1 % EX CREA: 1.0000 "application " | TOPICAL_CREAM | Freq: Two times a day (BID) | CUTANEOUS | 0 refills | Status: DC

## 2016-11-12 NOTE — Progress Notes (Signed)
   Subjective:    Patient ID: Carlos Diaz, male    DOB: Jul 17, 1961, 56 y.o.   MRN: [id number]  HPI Started with skin located on both forearms and rash under  Both arms, itchy, no pain. Has dry skin on forearms using body lotion but does not really help.   Review of Systems  Constitutional: Negative for chills and fever.  HENT: Negative for nosebleeds and postnasal drip.   Eyes: Negative.   Respiratory: Negative.   Cardiovascular: Negative.   Gastrointestinal: Negative.   Genitourinary: Negative.   Musculoskeletal: Positive for arthralgias.  Neurological: Negative for syncope and numbness.  Hematological: Negative.   Psychiatric/Behavioral: Negative.   hip pain mostly on right eyes.     Objective:   Physical Exam  NAD  PERRLA EOMI Axillary rash bilateral circular scaly measuring 3.5 cm Forearms anterior and posterior , uticarial sporatic rash , pt had itched them till they bled.  Pinpoint in size.Dry skin noted on forearms.        Assessment & Plan:  Tinea corporis lotrisone cream twoce daily x 14 days  Rash on forearms - triamcinolone cream twice daily to affected area x 7 days.  otc zyrtec take as directed for itching. Return to the clinic in  5 - 10 d if not improving. Pt wanted evoclin foam 1% but this is a clindamycin foam. He has dermatology appointment in May  Will see if he can get that refilled. MI in August sees Dr. Humphrey Rolls and [facility].

## 2016-11-12 NOTE — Patient Instructions (Signed)
Use creams as directed.

## 2017-11-17 ENCOUNTER — Other Ambulatory Visit: Payer: Self-pay | Admitting: Medical

## 2017-11-17 DIAGNOSIS — B354 Tinea corporis: Secondary | ICD-10-CM

## 2018-03-31 NOTE — Progress Notes (Signed)
Lipid panel reLab visit only

## 2018-04-01 ENCOUNTER — Other Ambulatory Visit: Payer: Self-pay

## 2018-04-01 DIAGNOSIS — Z0000 Encounter for general adult medical examination without abnormal findings: Secondary | ICD-10-CM

## 2018-04-02 LAB — COMPREHENSIVE METABOLIC PANEL
ALT: 16 IU/L (ref 0–44)
AST: 16 IU/L (ref 0–40)
Albumin/Globulin Ratio: 1.9 (ref 1.2–2.2)
Albumin: 4.4 g/dL (ref 3.5–5.5)
Alkaline Phosphatase: 59 IU/L (ref 39–117)
BILIRUBIN TOTAL: 0.6 mg/dL (ref 0.0–1.2)
BUN/Creatinine Ratio: 17 (ref 9–20)
BUN: 18 mg/dL (ref 6–24)
CHLORIDE: 103 mmol/L (ref 96–106)
CO2: 23 mmol/L (ref 20–29)
Calcium: 9.1 mg/dL (ref 8.7–10.2)
Creatinine, Ser: 1.08 mg/dL (ref 0.76–1.27)
GFR calc non Af Amer: 75 mL/min/{1.73_m2} (ref >59)
GFR, EST AFRICAN AMERICAN: 86 mL/min/{1.73_m2} (ref >59)
GLUCOSE: 90 mg/dL (ref 65–99)
Globulin, Total: 2.3 g/dL (ref 1.5–4.5)
Potassium: 4.3 mmol/L (ref 3.5–5.2)
Sodium: 139 mmol/L (ref 134–144)
TOTAL PROTEIN: 6.7 g/dL (ref 6.0–8.5)

## 2018-04-02 LAB — PSA, TOTAL AND FREE
PSA FREE PCT: 30 %
PSA, Free: 0.09 ng/mL
Prostate Specific Ag, Serum: 0.3 ng/mL (ref 0.0–4.0)

## 2018-05-04 ENCOUNTER — Emergency Department: Payer: BLUE CROSS/BLUE SHIELD

## 2018-05-04 ENCOUNTER — Other Ambulatory Visit: Payer: Self-pay

## 2018-05-04 ENCOUNTER — Emergency Department
Admission: EM | Admit: 2018-05-04 | Discharge: 2018-05-04 | Disposition: A | Discharge status: Home / Self Care | Payer: BLUE CROSS/BLUE SHIELD | Attending: Emergency Medicine | Admitting: Emergency Medicine

## 2018-05-04 ENCOUNTER — Encounter: Payer: Self-pay | Admitting: Emergency Medicine

## 2018-05-04 DIAGNOSIS — Y92320 Baseball field as the place of occurrence of the external cause: Secondary | ICD-10-CM | POA: Diagnosis not present

## 2018-05-04 DIAGNOSIS — I10 Essential (primary) hypertension: Secondary | ICD-10-CM | POA: Insufficient documentation

## 2018-05-04 DIAGNOSIS — S5011XA Contusion of right forearm, initial encounter: Secondary | ICD-10-CM | POA: Insufficient documentation

## 2018-05-04 DIAGNOSIS — Z79899 Other long term (current) drug therapy: Secondary | ICD-10-CM | POA: Insufficient documentation

## 2018-05-04 DIAGNOSIS — S59911A Unspecified injury of right forearm, initial encounter: Secondary | ICD-10-CM | POA: Diagnosis present

## 2018-05-04 DIAGNOSIS — Y9364 Activity, baseball: Secondary | ICD-10-CM | POA: Insufficient documentation

## 2018-05-04 DIAGNOSIS — Y998 Other external cause status: Secondary | ICD-10-CM | POA: Diagnosis not present

## 2018-05-04 DIAGNOSIS — Z7982 Long term (current) use of aspirin: Secondary | ICD-10-CM | POA: Diagnosis not present

## 2018-05-04 DIAGNOSIS — W2107XA Struck by softball, initial encounter: Secondary | ICD-10-CM | POA: Insufficient documentation

## 2018-05-04 MED ORDER — MELOXICAM 15 MG PO TABS: 15.0000 mg | ORAL_TABLET | Freq: Every day | ORAL | 0 refills | Status: DC

## 2018-05-04 MED ORDER — HYDROCODONE-ACETAMINOPHEN 5-325 MG PO TABS
1.0000 | ORAL_TABLET | Freq: Once | ORAL | Status: AC
  Administered 2018-05-04: 1 via ORAL
  Filled 2018-05-04: qty 1

## 2018-05-04 MED ORDER — MELOXICAM 7.5 MG PO TABS
15.0000 mg | ORAL_TABLET | Freq: Once | ORAL | Status: AC
  Administered 2018-05-04: 15 mg via ORAL
  Filled 2018-05-04: qty 2

## 2018-05-04 MED ORDER — TRAMADOL HCL 50 MG PO TABS: 50.0000 mg | ORAL_TABLET | Freq: Four times a day (QID) | ORAL | 0 refills | Status: DC | PRN

## 2018-05-04 NOTE — ED Notes (Signed)

## 2018-05-04 NOTE — ED Triage Notes (Signed)
Patient ambulatory to triage with steady gait, without difficulty or distress noted; pt reports hit in right FA PTA during baseball game; swelling noted

## 2018-05-04 NOTE — ED Provider Notes (Signed)
[facility] Emergency Department Provider Note  ____________________________________________  Time seen: Approximately 8:41 PM  I have reviewed the triage vital signs and the nursing notes.   HISTORY  Chief Complaint Arm Injury    HPI Carlos Diaz is a 57 y.o. male since the emergency department complaining of right forearm injury.  Patient was playing softball, was pitching, had a line drive hit him in the right forearm.  Patient with significant swelling, pain with range of motion.  Patient did not sustain any other injury.  No other complaints at this time.   History of previous injury to the right forearm.  No medications for this complaint prior to arrival.   Past Medical History:  Diagnosis Date  . Anxiety   . Hypertension   . Lyme disease   . Myocardial infarction [facility]) 04/13/2016    Patient Active Problem List   Diagnosis Date Noted  . Anxiety 06/02/2016  . Depression 06/02/2016  . Hyperlipidemia, unspecified 06/02/2016  . Hypertension 06/02/2016  . Lyme disease 06/02/2016  . NSTEMI (non-ST elevated myocardial infarction) ([place]) 04/13/2016    Past Surgical History:  Procedure Laterality Date  . CARDIAC CATHETERIZATION Right 04/14/2016   Procedure: Left Heart Cath and Coronary Angiography;  Surgeon: Dionisio Omari, MD;  Location: [place] [facility];  Service: Cardiovascular;  Laterality: Right;  . CARDIAC CATHETERIZATION N/A 04/14/2016   Procedure: Coronary Stent Intervention;  Surgeon: Yolonda Kida, MD;  Location: [place] [facility];  Service: Cardiovascular;  Laterality: N/A;    Prior to Admission medications   Medication Sig Start Date End Date Taking? Authorizing Provider  aspirin 81 MG chewable tablet Chew 1 tablet (81 mg total) by mouth daily. 04/15/16   Fritzi Mandes, MD  atorvastatin (LIPITOR) 40 MG tablet Take 1 tablet (40 mg total) by mouth daily. 04/15/16   Fritzi Mandes, MD  citalopram (CELEXA) 20 MG tablet Take by mouth.  05/15/16 08/13/16  [provider]  clonazePAM (KLONOPIN) 0.5 MG tablet Take by mouth. 05/15/16 06/14/16  [provider]  famotidine (PEPCID) 20 MG tablet Take 1 tablet (20 mg total) by mouth 2 (two) times daily. Patient not taking: Reported on 11/12/2016 08/19/16   Carrie Mew, MD  meloxicam (MOBIC) 15 MG tablet Take 1 tablet (15 mg total) by mouth daily. 05/04/18   Kiyoshi Schaab, Charline Bills, PA-C  ranolazine (RANEXA) 500 MG 12 hr tablet Take by mouth. 05/15/16 05/15/17  [provider]  sucralfate (CARAFATE) 1 g tablet Take by mouth. 05/06/16   [provider]  ticagrelor (BRILINTA) 90 MG TABS tablet Take 1 tablet (90 mg total) by mouth 2 (two) times daily. 04/15/16   Fritzi Mandes, MD  traMADol (ULTRAM) 50 MG tablet Take 1 tablet (50 mg total) by mouth every 6 (six) hours as needed. 05/04/18   Lulu Hirschmann, Charline Bills, PA-C  triamcinolone cream (KENALOG) 0.1 % Apply 1 application topically 2 (two) times daily. Small amount  To forearms  X 7 days. 11/12/16   Talmage Nap, PA-C    Allergies Patient has no known allergies.  No family history on file.  Social History Social History   Tobacco Use  . Smoking status: Never Smoker  . Smokeless tobacco: Never Used  Substance Use Topics  . Alcohol use: No  . Drug use: No     Review of Systems  Constitutional: No fever/chills Eyes: No visual changes.  Cardiovascular: no chest pain. Respiratory: no cough. No SOB. Gastrointestinal: No abdominal pain.  No nausea, no  vomiting. Musculoskeletal: Positive for right forearm injury/swelling/pain Skin: Negative for rash, abrasions, lacerations, ecchymosis. Neurological: Negative for headaches, focal weakness or numbness. 10-point ROS otherwise negative.  ____________________________________________   PHYSICAL EXAM:  VITAL SIGNS: ED Triage Vitals  Enc Vitals Group     BP 05/04/18 1940 (!) 153/93     Pulse Rate 05/04/18 1940 84     Resp 05/04/18 1940 18      Temp 05/04/18 1940 100 F (37.8 C)     Temp Source 05/04/18 1940 Oral     SpO2 05/04/18 1940 97 %     Weight 05/04/18 1939 230 lb (104.3 kg)     Height 05/04/18 1939 6' (1.829 m)     Head Circumference --      Peak Flow --      Pain Score 05/04/18 1939 6     Pain Loc --      Pain Edu? --      Excl. in [place]? --      Constitutional: Alert and oriented. Well appearing and in no acute distress. Eyes: Conjunctivae are normal. PERRL. EOMI. Head: Atraumatic. ENT:      Ears:       Nose: No congestion/rhinnorhea.      Mouth/Throat: Mucous membranes are moist.  Neck: No stridor.    Cardiovascular: Normal rate, regular rhythm. Normal S1 and S2.  Good peripheral circulation. Respiratory: Normal respiratory effort without tachypnea or retractions. Lungs CTAB. Good air entry to the bases with no decreased or absent breath sounds. Musculoskeletal: Full range of motion to all extremities. No gross deformities appreciated.  Utilization of the right forearm reveals a significant edema mid forearm.  Small abrasion consistent with threads of the softball.  No active bleeding.  No visible foreign body.  Patient is exquisitely tender to palpation mid ulna and radius.  Palpable hematoma but no underlying palpable osseous abnormality.  Examination of the elbow and wrist is unremarkable.  Radial pulse intact distally.  Sensation intact all 5 digits. Neurologic:  Normal speech and language. No gross focal neurologic deficits are appreciated.  Skin:  Skin is warm, dry and intact. No rash noted. Psychiatric: Mood and affect are normal. Speech and behavior are normal. Patient exhibits appropriate insight and judgement.   ____________________________________________   LABS (all labs ordered are listed, but only abnormal results are displayed)  Labs Reviewed - No data to display ____________________________________________  EKG   ____________________________________________  RADIOLOGY I personally viewed  and evaluated these images as part of my medical decision making, as well as reviewing the written report by the radiologist.  Visualization of forearm reveals no acute osseous abnormality.  Significant soft tissue swelling.  Dg Forearm Right  Result Date: 05/04/2018 CLINICAL DATA:  Hit with baseball EXAM: RIGHT FOREARM - 2 VIEW COMPARISON:  None. FINDINGS: No acute displaced fracture or malalignment. Large soft tissue swelling over the dorsal and radial aspect of the mid forearm. IMPRESSION: Large amount of mid forearm soft tissue swelling. No acute osseous abnormality. Electronically Signed   By: Donavan Foil M.D.   On: 05/04/2018 20:21    ____________________________________________    PROCEDURES  Procedure(s) performed:    Marland KitchenMarland KitchenLaceration Repair Date/Time: 05/04/2018 8:51 PM Performed by: Darletta Moll, PA-C Authorized by: Darletta Moll, PA-C   Consent:    Consent obtained:  Verbal   Consent given by:  Patient   Risks discussed:  Pain Anesthesia (see MAR for exact dosages):    Anesthesia method:  None Laceration details:  Location:  Shoulder/arm   Shoulder/arm location:  R lower arm Repair type:    Repair type:  Simple Exploration:    Wound exploration: wound explored through full range of motion   Treatment:    Area cleansed with:  Shur-Clens   Amount of cleaning:  Standard Post-procedure details:    Dressing:  Sterile dressing   Patient tolerance of procedure:  Tolerated well, no immediate complications Comments:     Superficial laceration was thoroughly cleansed, dressed.  No closure needed.      Medications  HYDROcodone-acetaminophen (NORCO/VICODIN) 5-325 MG per tablet 1 tablet (1 tablet Oral Given 05/04/18 2039)  meloxicam (MOBIC) tablet 15 mg (15 mg Oral Given 05/04/18 2039)     ____________________________________________   INITIAL IMPRESSION / ASSESSMENT AND PLAN / ED COURSE  Pertinent labs & imaging results that were available during my  care of the patient were reviewed by me and considered in my medical decision making (see chart for details).  Review of the [place] CSRS was performed in accordance of the [place] prior to dispensing any controlled drugs.      Patient's diagnosis is consistent with forearm contusion.  Patient presented to the emergency department after being struck by a line drive while pitching.  Significant soft tissue edema is appreciated.  No underlying osseous abnormality on x-ray.  Wound is cleansed, dressed in the emergency department.  Superficial in nature does not require closure.  Patient will be prescribed meloxicam, Ultram for symptom relief..  Patient will follow-up with primary care or orthopedics as needed.  Patient is given ED precautions to return to the ED for any worsening or new symptoms.     ____________________________________________  FINAL CLINICAL IMPRESSION(S) / ED DIAGNOSES  Final diagnoses:  Contusion of right forearm, initial encounter      NEW MEDICATIONS STARTED DURING THIS VISIT:  ED Discharge Orders         Ordered    meloxicam (MOBIC) 15 MG tablet  Daily     05/04/18 2042    traMADol (ULTRAM) 50 MG tablet  Every 6 hours PRN     05/04/18 2042              This chart was dictated using voice recognition software/Dragon. Despite best efforts to proofread, errors can occur which can change the meaning. Any change was purely unintentional.    Darletta Moll, PA-C 05/04/18 2053    Schuyler Amor, MD 05/05/18 0001

## 2018-11-23 ENCOUNTER — Encounter: Payer: Self-pay | Admitting: Cardiovascular Disease

## 2018-11-23 DIAGNOSIS — R079 Chest pain, unspecified: Secondary | ICD-10-CM | POA: Diagnosis not present

## 2018-11-23 DIAGNOSIS — E782 Mixed hyperlipidemia: Secondary | ICD-10-CM | POA: Diagnosis not present

## 2018-11-23 DIAGNOSIS — I10 Essential (primary) hypertension: Secondary | ICD-10-CM | POA: Diagnosis not present

## 2018-11-23 DIAGNOSIS — Z9861 Coronary angioplasty status: Secondary | ICD-10-CM | POA: Diagnosis not present

## 2018-11-24 DIAGNOSIS — R079 Chest pain, unspecified: Secondary | ICD-10-CM | POA: Diagnosis not present

## 2018-11-26 DIAGNOSIS — R079 Chest pain, unspecified: Secondary | ICD-10-CM | POA: Diagnosis not present

## 2018-11-26 DIAGNOSIS — I10 Essential (primary) hypertension: Secondary | ICD-10-CM | POA: Diagnosis not present

## 2018-11-26 DIAGNOSIS — R0602 Shortness of breath: Secondary | ICD-10-CM | POA: Diagnosis not present

## 2018-11-26 DIAGNOSIS — I2510 Atherosclerotic heart disease of native coronary artery without angina pectoris: Secondary | ICD-10-CM | POA: Diagnosis not present

## 2018-11-29 DIAGNOSIS — R079 Chest pain, unspecified: Secondary | ICD-10-CM | POA: Diagnosis not present

## 2018-11-29 DIAGNOSIS — E782 Mixed hyperlipidemia: Secondary | ICD-10-CM | POA: Diagnosis not present

## 2018-11-29 DIAGNOSIS — I10 Essential (primary) hypertension: Secondary | ICD-10-CM | POA: Diagnosis not present

## 2018-11-29 DIAGNOSIS — I2510 Atherosclerotic heart disease of native coronary artery without angina pectoris: Secondary | ICD-10-CM | POA: Diagnosis not present

## 2018-11-30 DIAGNOSIS — R072 Precordial pain: Secondary | ICD-10-CM | POA: Diagnosis not present

## 2018-12-01 DIAGNOSIS — R079 Chest pain, unspecified: Secondary | ICD-10-CM | POA: Diagnosis not present

## 2018-12-01 DIAGNOSIS — K210 Gastro-esophageal reflux disease with esophagitis: Secondary | ICD-10-CM | POA: Diagnosis not present

## 2018-12-01 DIAGNOSIS — E782 Mixed hyperlipidemia: Secondary | ICD-10-CM | POA: Diagnosis not present

## 2018-12-01 DIAGNOSIS — I2510 Atherosclerotic heart disease of native coronary artery without angina pectoris: Secondary | ICD-10-CM | POA: Diagnosis not present

## 2018-12-22 ENCOUNTER — Other Ambulatory Visit: Payer: Self-pay | Admitting: Cardiovascular Disease

## 2018-12-22 DIAGNOSIS — K210 Gastro-esophageal reflux disease with esophagitis: Secondary | ICD-10-CM | POA: Diagnosis not present

## 2018-12-22 DIAGNOSIS — I200 Unstable angina: Secondary | ICD-10-CM | POA: Insufficient documentation

## 2018-12-22 DIAGNOSIS — I25110 Atherosclerotic heart disease of native coronary artery with unstable angina pectoris: Secondary | ICD-10-CM | POA: Diagnosis not present

## 2018-12-22 DIAGNOSIS — R079 Chest pain, unspecified: Secondary | ICD-10-CM | POA: Diagnosis not present

## 2018-12-23 ENCOUNTER — Observation Stay
Admission: RE | Admit: 2018-12-23 | Discharge: 2018-12-24 | Disposition: A | Discharge status: Home / Self Care | Payer: BLUE CROSS/BLUE SHIELD | Attending: Internal Medicine | Admitting: Internal Medicine

## 2018-12-23 ENCOUNTER — Encounter
Admission: RE | Disposition: A | Discharge status: Home / Self Care | Payer: Self-pay | Source: Home / Self Care | Attending: Internal Medicine

## 2018-12-23 ENCOUNTER — Encounter: Payer: Self-pay | Admitting: *Deleted

## 2018-12-23 ENCOUNTER — Other Ambulatory Visit: Payer: Self-pay

## 2018-12-23 DIAGNOSIS — Z7902 Long term (current) use of antithrombotics/antiplatelets: Secondary | ICD-10-CM | POA: Diagnosis not present

## 2018-12-23 DIAGNOSIS — E785 Hyperlipidemia, unspecified: Secondary | ICD-10-CM | POA: Diagnosis not present

## 2018-12-23 DIAGNOSIS — I2510 Atherosclerotic heart disease of native coronary artery without angina pectoris: Secondary | ICD-10-CM | POA: Diagnosis not present

## 2018-12-23 DIAGNOSIS — F419 Anxiety disorder, unspecified: Secondary | ICD-10-CM | POA: Insufficient documentation

## 2018-12-23 DIAGNOSIS — Z79899 Other long term (current) drug therapy: Secondary | ICD-10-CM | POA: Diagnosis not present

## 2018-12-23 DIAGNOSIS — I25110 Atherosclerotic heart disease of native coronary artery with unstable angina pectoris: Principal | ICD-10-CM | POA: Insufficient documentation

## 2018-12-23 DIAGNOSIS — I200 Unstable angina: Secondary | ICD-10-CM

## 2018-12-23 DIAGNOSIS — R079 Chest pain, unspecified: Secondary | ICD-10-CM | POA: Diagnosis present

## 2018-12-23 DIAGNOSIS — I252 Old myocardial infarction: Secondary | ICD-10-CM | POA: Diagnosis not present

## 2018-12-23 DIAGNOSIS — I10 Essential (primary) hypertension: Secondary | ICD-10-CM | POA: Diagnosis not present

## 2018-12-23 DIAGNOSIS — Z9861 Coronary angioplasty status: Secondary | ICD-10-CM | POA: Diagnosis not present

## 2018-12-23 DIAGNOSIS — Z8249 Family history of ischemic heart disease and other diseases of the circulatory system: Secondary | ICD-10-CM | POA: Diagnosis not present

## 2018-12-23 DIAGNOSIS — Z955 Presence of coronary angioplasty implant and graft: Secondary | ICD-10-CM | POA: Diagnosis not present

## 2018-12-23 DIAGNOSIS — Z7982 Long term (current) use of aspirin: Secondary | ICD-10-CM | POA: Diagnosis not present

## 2018-12-23 HISTORY — DX: Hyperlipidemia, unspecified: E78.5

## 2018-12-23 HISTORY — PX: CORONARY STENT INTERVENTION: CATH118234

## 2018-12-23 HISTORY — PX: LEFT HEART CATH AND CORONARY ANGIOGRAPHY: CATH118249

## 2018-12-23 LAB — CBC
HCT: 38.1 % — ABNORMAL LOW (ref 39.0–52.0)
HCT: 38.1 % — ABNORMAL LOW (ref 39.0–52.0)
Hemoglobin: 13.1 g/dL (ref 13.0–17.0)
Hemoglobin: 13 g/dL (ref 13.0–17.0)
MCH: 29.7 pg (ref 26.0–34.0)
MCH: 29.7 pg (ref 26.0–34.0)
MCHC: 34.4 g/dL (ref 30.0–36.0)
MCHC: 34.1 g/dL (ref 30.0–36.0)
MCV: 86.4 fL (ref 80.0–100.0)
MCV: 87.2 fL (ref 80.0–100.0)
Platelets: 172 10*3/uL (ref 150–400)
Platelets: 160 10*3/uL (ref 150–400)
RBC: 4.41 MIL/uL (ref 4.22–5.81)
RBC: 4.37 MIL/uL (ref 4.22–5.81)
RDW: 12.8 % (ref 11.5–15.5)
RDW: 12.8 % (ref 11.5–15.5)
WBC: 7.7 10*3/uL (ref 4.0–10.5)
WBC: 8.1 10*3/uL (ref 4.0–10.5)
nRBC: 0 % (ref 0.0–0.2)
nRBC: 0 % (ref 0.0–0.2)

## 2018-12-23 LAB — CREATININE, SERUM
Creatinine, Ser: 0.9 mg/dL (ref 0.61–1.24)
GFR calc Af Amer: >60 mL/min (ref >60)
GFR calc non Af Amer: >60 mL/min (ref >60)

## 2018-12-23 SURGERY — LEFT HEART CATH AND CORONARY ANGIOGRAPHY
Anesthesia: Moderate Sedation

## 2018-12-23 MED ORDER — FENTANYL CITRATE (PF) 100 MCG/2ML IJ SOLN
INTRAMUSCULAR | Status: DC | PRN
  Administered 2018-12-23 (×3): 25 ug via INTRAVENOUS

## 2018-12-23 MED ORDER — BUPROPION HCL ER (XL) 150 MG PO TB24
150.0000 mg | ORAL_TABLET | Freq: Every day | ORAL | Status: DC
  Administered 2018-12-23 – 2018-12-24 (×2): 150 mg via ORAL
  Filled 2018-12-23 (×2): qty 1

## 2018-12-23 MED ORDER — NITROGLYCERIN 1 MG/10 ML FOR IR/CATH LAB
INTRA_ARTERIAL | Status: AC
  Filled 2018-12-23: qty 10

## 2018-12-23 MED ORDER — VERAPAMIL HCL 2.5 MG/ML IV SOLN
INTRAVENOUS | Status: DC | PRN
  Administered 2018-12-23: 2.5 mg via INTRA_ARTERIAL

## 2018-12-23 MED ORDER — SODIUM CHLORIDE 0.9% FLUSH: 3.0000 mL | INTRAVENOUS | Status: DC | PRN

## 2018-12-23 MED ORDER — SODIUM CHLORIDE 0.9 % IV SOLN: 250.0000 mL | INTRAVENOUS | Status: DC | PRN

## 2018-12-23 MED ORDER — ACETAMINOPHEN 325 MG PO TABS: 650.0000 mg | ORAL_TABLET | Freq: Four times a day (QID) | ORAL | Status: DC | PRN

## 2018-12-23 MED ORDER — SODIUM CHLORIDE 0.9% FLUSH: 3.0000 mL | Freq: Two times a day (BID) | INTRAVENOUS | Status: DC

## 2018-12-23 MED ORDER — PANTOPRAZOLE SODIUM 20 MG PO TBEC
20.0000 mg | DELAYED_RELEASE_TABLET | Freq: Two times a day (BID) | ORAL | Status: DC
  Administered 2018-12-23 – 2018-12-24 (×3): 20 mg via ORAL
  Filled 2018-12-23 (×4): qty 1

## 2018-12-23 MED ORDER — ASPIRIN 81 MG PO CHEW: 81.0000 mg | CHEWABLE_TABLET | Freq: Every day | ORAL | Status: DC

## 2018-12-23 MED ORDER — ROSUVASTATIN CALCIUM 20 MG PO TABS
40.0000 mg | ORAL_TABLET | Freq: Every day | ORAL | Status: DC
  Administered 2018-12-23 – 2018-12-24 (×2): 40 mg via ORAL
  Filled 2018-12-23: qty 2
  Filled 2018-12-23 (×2): qty 4
  Filled 2018-12-23: qty 2

## 2018-12-23 MED ORDER — SODIUM CHLORIDE 0.9 % WEIGHT BASED INFUSION
3.0000 mL/kg/h | INTRAVENOUS | Status: AC
  Administered 2018-12-23: 3 mL/kg/h via INTRAVENOUS

## 2018-12-23 MED ORDER — MIDAZOLAM HCL 2 MG/2ML IJ SOLN
INTRAMUSCULAR | Status: AC
  Filled 2018-12-23: qty 2

## 2018-12-23 MED ORDER — MIDAZOLAM HCL 2 MG/2ML IJ SOLN
INTRAMUSCULAR | Status: DC | PRN
  Administered 2018-12-23: 1 mg via INTRAVENOUS

## 2018-12-23 MED ORDER — ASPIRIN 81 MG PO CHEW
CHEWABLE_TABLET | ORAL | Status: AC
  Filled 2018-12-23: qty 1

## 2018-12-23 MED ORDER — SODIUM CHLORIDE 0.9 % WEIGHT BASED INFUSION: 1.0000 mL/kg/h | INTRAVENOUS | Status: AC

## 2018-12-23 MED ORDER — NITROGLYCERIN 0.4 MG SL SUBL
SUBLINGUAL_TABLET | SUBLINGUAL | Status: DC | PRN
  Administered 2018-12-23: .4 mg via SUBLINGUAL

## 2018-12-23 MED ORDER — ENOXAPARIN SODIUM 40 MG/0.4ML ~~LOC~~ SOLN
40.0000 mg | SUBCUTANEOUS | Status: DC
  Administered 2018-12-23: 40 mg via SUBCUTANEOUS
  Filled 2018-12-23: qty 0.4

## 2018-12-23 MED ORDER — HYDRALAZINE HCL 20 MG/ML IJ SOLN: 10.0000 mg | INTRAMUSCULAR | Status: AC | PRN

## 2018-12-23 MED ORDER — METOPROLOL SUCCINATE ER 50 MG PO TB24
25.0000 mg | ORAL_TABLET | Freq: Every day | ORAL | Status: DC
  Administered 2018-12-23 – 2018-12-24 (×2): 25 mg via ORAL
  Filled 2018-12-23 (×2): qty 1

## 2018-12-23 MED ORDER — HEPARIN SODIUM (PORCINE) 1000 UNIT/ML IJ SOLN
INTRAMUSCULAR | Status: DC | PRN
  Administered 2018-12-23: 4000 [IU] via INTRAVENOUS
  Administered 2018-12-23 (×2): 3000 [IU] via INTRAVENOUS

## 2018-12-23 MED ORDER — ACETAMINOPHEN 650 MG RE SUPP
650.0000 mg | Freq: Four times a day (QID) | RECTAL | Status: DC | PRN
  Filled 2018-12-23: qty 1

## 2018-12-23 MED ORDER — HEPARIN SODIUM (PORCINE) 1000 UNIT/ML IJ SOLN
INTRAMUSCULAR | Status: AC
  Filled 2018-12-23: qty 1

## 2018-12-23 MED ORDER — ONDANSETRON HCL 4 MG/2ML IJ SOLN: 4.0000 mg | Freq: Four times a day (QID) | INTRAMUSCULAR | Status: DC | PRN

## 2018-12-23 MED ORDER — HEPARIN SODIUM (PORCINE) 1000 UNIT/ML IJ SOLN
INTRAMUSCULAR | Status: DC | PRN
  Administered 2018-12-23: 5000 [IU] via INTRAVENOUS

## 2018-12-23 MED ORDER — VERAPAMIL HCL 2.5 MG/ML IV SOLN
INTRAVENOUS | Status: AC
  Filled 2018-12-23: qty 2

## 2018-12-23 MED ORDER — CLOPIDOGREL BISULFATE 75 MG PO TABS
ORAL_TABLET | ORAL | Status: DC | PRN
  Administered 2018-12-23: 300 mg via ORAL

## 2018-12-23 MED ORDER — ACETAMINOPHEN 325 MG PO TABS
650.0000 mg | ORAL_TABLET | ORAL | Status: DC | PRN
  Filled 2018-12-23: qty 2

## 2018-12-23 MED ORDER — NITROGLYCERIN 1 MG/10 ML FOR IR/CATH LAB
INTRA_ARTERIAL | Status: DC | PRN
  Administered 2018-12-23 (×2): 200 ug via INTRACORONARY

## 2018-12-23 MED ORDER — HEPARIN (PORCINE) IN NACL 1000-0.9 UT/500ML-% IV SOLN
INTRAVENOUS | Status: AC
  Filled 2018-12-23: qty 1000

## 2018-12-23 MED ORDER — CLOPIDOGREL BISULFATE 75 MG PO TABS: 75.0000 mg | ORAL_TABLET | Freq: Every day | ORAL | Status: DC

## 2018-12-23 MED ORDER — CLOPIDOGREL BISULFATE 75 MG PO TABS
75.0000 mg | ORAL_TABLET | Freq: Every day | ORAL | Status: DC
  Administered 2018-12-24: 75 mg via ORAL
  Filled 2018-12-23: qty 1

## 2018-12-23 MED ORDER — SODIUM CHLORIDE 0.9 % WEIGHT BASED INFUSION: 1.0000 mL/kg/h | INTRAVENOUS | Status: DC

## 2018-12-23 MED ORDER — FENTANYL CITRATE (PF) 100 MCG/2ML IJ SOLN
INTRAMUSCULAR | Status: AC
  Filled 2018-12-23: qty 2

## 2018-12-23 MED ORDER — MIDAZOLAM HCL 2 MG/2ML IJ SOLN
INTRAMUSCULAR | Status: DC | PRN
  Administered 2018-12-23 (×3): 1 mg via INTRAVENOUS

## 2018-12-23 MED ORDER — SODIUM CHLORIDE 0.9% FLUSH
3.0000 mL | Freq: Two times a day (BID) | INTRAVENOUS | Status: DC
  Administered 2018-12-23: 3 mL via INTRAVENOUS

## 2018-12-23 MED ORDER — ONDANSETRON HCL 4 MG PO TABS
4.0000 mg | ORAL_TABLET | Freq: Four times a day (QID) | ORAL | Status: DC | PRN
  Filled 2018-12-23: qty 1

## 2018-12-23 MED ORDER — ASPIRIN 81 MG PO CHEW
CHEWABLE_TABLET | ORAL | Status: DC | PRN
  Administered 2018-12-23: 81 mg via ORAL

## 2018-12-23 MED ORDER — HEPARIN (PORCINE) IN NACL 1000-0.9 UT/500ML-% IV SOLN
INTRAVENOUS | Status: AC
  Filled 2018-12-23: qty 500

## 2018-12-23 MED ORDER — HEPARIN (PORCINE) IN NACL 1000-0.9 UT/500ML-% IV SOLN
INTRAVENOUS | Status: DC | PRN
  Administered 2018-12-23: 1000 mL

## 2018-12-23 MED ORDER — CLOPIDOGREL BISULFATE 300 MG PO TABS
ORAL_TABLET | ORAL | Status: AC
  Filled 2018-12-23: qty 1

## 2018-12-23 MED ORDER — NITROGLYCERIN 0.4 MG SL SUBL
SUBLINGUAL_TABLET | SUBLINGUAL | Status: AC
  Filled 2018-12-23: qty 1

## 2018-12-23 MED ORDER — ASPIRIN 81 MG PO CHEW
81.0000 mg | CHEWABLE_TABLET | Freq: Every day | ORAL | Status: DC
  Administered 2018-12-24: 81 mg via ORAL
  Filled 2018-12-23: qty 1

## 2018-12-23 MED ORDER — SODIUM CHLORIDE 0.9 % WEIGHT BASED INFUSION
1.0000 mL/kg/h | INTRAVENOUS | Status: AC
  Administered 2018-12-23: 1 mL/kg/h via INTRAVENOUS

## 2018-12-23 MED ORDER — LABETALOL HCL 5 MG/ML IV SOLN: 10.0000 mg | INTRAVENOUS | Status: AC | PRN

## 2018-12-23 MED ORDER — SODIUM CHLORIDE 0.9% FLUSH
3.0000 mL | Freq: Two times a day (BID) | INTRAVENOUS | Status: DC
  Administered 2018-12-24: 3 mL via INTRAVENOUS

## 2018-12-23 MED ORDER — ACETAMINOPHEN 325 MG PO TABS: 650.0000 mg | ORAL_TABLET | ORAL | Status: DC | PRN

## 2018-12-23 MED ORDER — IOPAMIDOL (ISOVUE-300) INJECTION 61%
INTRAVENOUS | Status: DC | PRN
  Administered 2018-12-23: 10:00:00 130 mL via INTRA_ARTERIAL

## 2018-12-23 MED ORDER — CITALOPRAM HYDROBROMIDE 20 MG PO TABS
40.0000 mg | ORAL_TABLET | Freq: Every day | ORAL | Status: DC
  Administered 2018-12-23 – 2018-12-24 (×2): 40 mg via ORAL
  Filled 2018-12-23 (×2): qty 2

## 2018-12-23 MED ORDER — SODIUM CHLORIDE 0.9% FLUSH
3.0000 mL | Freq: Two times a day (BID) | INTRAVENOUS | Status: DC
  Administered 2018-12-23 – 2018-12-24 (×2): 3 mL via INTRAVENOUS

## 2018-12-23 MED ORDER — FENTANYL CITRATE (PF) 100 MCG/2ML IJ SOLN
INTRAMUSCULAR | Status: DC | PRN
  Administered 2018-12-23: 25 ug via INTRAVENOUS

## 2018-12-23 SURGICAL SUPPLY — 17 items
BALLN TREK RX 2.25X12 (BALLOONS) ×3
BALLOON TREK RX 2.25X12 (BALLOONS) ×2 IMPLANT
CATH INFINITI 5 FR 3DRC (CATHETERS) ×3 IMPLANT
CATH INFINITI 5 FR JL3.5 (CATHETERS) ×3 IMPLANT
CATH INFINITI 5FR ANG PIGTAIL (CATHETERS) ×3 IMPLANT
CATH INFINITI 5FR TG (CATHETERS) ×3 IMPLANT
CATH INFINITI JR4 5F (CATHETERS) ×3 IMPLANT
CATH VISTA GUIDE 6FR JL3.5 (CATHETERS) ×3 IMPLANT
DEVICE INFLAT 30 PLUS (MISCELLANEOUS) ×3 IMPLANT
DEVICE RAD TR BAND REGULAR (VASCULAR PRODUCTS) ×3 IMPLANT
GLIDESHEATH SLEND SS 6F .021 (SHEATH) ×3 IMPLANT
KIT MANI 3VAL PERCEP (MISCELLANEOUS) ×3 IMPLANT
PACK CARDIAC CATH (CUSTOM PROCEDURE TRAY) ×3 IMPLANT
SHEATH AVANTI 6FR X 11CM (SHEATH) IMPLANT
STENT RESOLUTE ONYX 2.25X15 (Permanent Stent) ×3 IMPLANT
WIRE ASAHI PROWATER 180CM (WIRE) ×3 IMPLANT
WIRE ROSEN-J .035X260CM (WIRE) ×3 IMPLANT

## 2018-12-23 NOTE — Progress Notes (Signed)
Followed deflation orders and removed band at 1315. Noticed level 1 hematoma. Patient started oozing and pressure was held. Cath lab RN reapplied TR band at 5 cc of air. Begin deflation at 1400.

## 2018-12-23 NOTE — Consult Note (Signed)
Carlos Diaz is a 58 y.o. male  [id number]  Primary Cardiologist: Neoma Laming Reason for Consultation: Unstable angina and high-grade lesion in OM1  HPI: This is a 58 year old white male with history of chest pain intermittently associated with shortness of breath diaphoresis.  He has a history of having PCI in the past with stent in the ramus intermedius and presented yesterday with severe chest pain associated with shortness of breath.  He was being treated medically for moderate CAD in the LAD and RCA on CTA coronaries but got worse yesterday and was scheduled for cardiac catheterization today.   Review of Systems: No chest pain at this time   Past Medical History:  Diagnosis Date  . Anxiety   . Hyperlipidemia   . Hypertension   . Lyme disease   . Myocardial infarction ([place]) 04/13/2016    Medications Prior to Admission  Medication Sig Dispense Refill  . aspirin 81 MG chewable tablet Chew 1 tablet (81 mg total) by mouth daily. 30 tablet 1  . buPROPion (WELLBUTRIN XL) 150 MG 24 hr tablet Take 150 mg by mouth daily.    . citalopram (CELEXA) 20 MG tablet Take 40 mg by mouth daily.     . clopidogrel (PLAVIX) 75 MG tablet Take 75 mg by mouth daily.    . metoprolol succinate (TOPROL-XL) 25 MG 24 hr tablet Take 25 mg by mouth daily.    . pantoprazole (PROTONIX) 20 MG tablet Take 20 mg by mouth 2 (two) times daily.    . rosuvastatin (CRESTOR) 40 MG tablet Take 40 mg by mouth daily.    Marland Kitchen triamcinolone cream (KENALOG) 0.1 % Apply 1 application topically 2 (two) times daily. Small amount  To forearms  X 7 days. (Patient taking differently: Apply 1 application topically 2 (two) times daily as needed (rash). Small amount  To forearms  X 7 days.) 30 g 0     . sodium chloride flush  3 mL Intravenous Q12H    Infusions: . sodium chloride    . sodium chloride      No Known Allergies  Social History   Socioeconomic History  . Marital status: Married    Spouse name: Not on file   . Number of children: Not on file  . Years of education: Not on file  . Highest education level: Not on file  Occupational History  . Not on file  Social Needs  . Financial resource strain: Not on file  . Food insecurity:    Worry: Not on file    Inability: Not on file  . Transportation needs:    Medical: Not on file    Non-medical: Not on file  Tobacco Use  . Smoking status: Never Smoker  . Smokeless tobacco: Never Used  Substance and Sexual Activity  . Alcohol use: No  . Drug use: No  . Sexual activity: Not on file  Lifestyle  . Physical activity:    Days per week: Not on file    Minutes per session: Not on file  . Stress: Not on file  Relationships  . Social connections:    Talks on phone: Not on file    Gets together: Not on file    Attends religious service: Not on file    Active member of club or organization: Not on file    Attends meetings of clubs or organizations: Not on file    Relationship status: Not on file  . Intimate partner violence:    Fear  of current or ex partner: Not on file    Emotionally abused: Not on file    Physically abused: Not on file    Forced sexual activity: Not on file  Other Topics Concern  . Not on file  Social History Narrative  . Not on file    No family history on file.  PHYSICAL EXAM: Vitals:   12/23/18 0702  BP: (!) 150/95  Pulse: 68  Resp: 12  Temp: 97.6 F (36.4 C)  SpO2: 97%    No intake or output data in the 24 hours ending 12/23/18 0938  General:  Well appearing. No respiratory difficulty HEENT: normal Neck: supple. no JVD. Carotids 2+ bilat; no bruits. No lymphadenopathy or thryomegaly appreciated. Cor: PMI nondisplaced. Regular rate & rhythm. No rubs, gallops or murmurs. Lungs: clear Abdomen: soft, nontender, nondistended. No hepatosplenomegaly. No bruits or masses. Good bowel sounds. Extremities: no cyanosis, clubbing, rash, edema Neuro: alert & oriented x 3, cranial nerves grossly intact. moves all 4  extremities w/o difficulty. Affect pleasant.  ECG: Normal sinus rhythm no acute changes  Results for orders placed or performed during the hospital encounter of 12/23/18 (from the past 24 hour(s))  CBC     Status: Abnormal   Collection Time: 12/23/18  6:53 AM  Result Value Ref Range   WBC 7.7 4.0 - 10.5 K/uL   RBC 4.41 4.22 - 5.81 MIL/uL   Hemoglobin 13.1 13.0 - 17.0 g/dL   HCT 38.1 (L) 39.0 - 52.0 %   MCV 86.4 80.0 - 100.0 fL   MCH 29.7 26.0 - 34.0 pg   MCHC 34.4 30.0 - 36.0 g/dL   RDW 12.8 11.5 - 15.5 %   Platelets 172 150 - 400 K/uL   nRBC 0.0 0.0 - 0.2 %   No results found.   ASSESSMENT AND PLAN: Unstable angina with chest pain at rest and cardiac catheterization showing 95% lesion in obtuse marginal #1 which is a branch of circumflex.  Plan is to do PCI with drug-eluting stent and admit overnight.  May be discharged in the morning.  KHAN,SHAUKAT A

## 2018-12-23 NOTE — H&P (Signed)
[place] [facility] [place] NAME: Carlos Diaz    MR#:  [id number]  DATE OF BIRTH:  1961-03-08  DATE OF ADMISSION:  12/23/2018  PRIMARY CARE PHYSICIAN: Valera Castle, MD   REQUESTING/REFERRING PHYSICIAN: Neoma Laming MD  CHIEF COMPLAINT:  No chief complaint on file.   HISTORY OF PRESENT ILLNESS: Carlos Diaz  is a 58 y.o. male with a known history of coronary artery disease, hyperlipidemia, and hypertension who had abnormal stress test as outpatient.  He had a cardiac cath done by cardiology which showed no obstruction to the obtuse first marginal which was stented.  Patient currently denies any chest pain or shortness of breath were asked to observe the patient overnight due to having a stent by cardiology  PAST MEDICAL HISTORY:   Past Medical History:  Diagnosis Date  . Anxiety   . Hyperlipidemia   . Hypertension   . Lyme disease   . Myocardial infarction ([place]) 04/13/2016    PAST SURGICAL HISTORY:  Past Surgical History:  Procedure Laterality Date  . CARDIAC CATHETERIZATION Right 04/14/2016   Procedure: Left Heart Cath and Coronary Angiography;  Surgeon: Dionisio Lincoln, MD;  Location: [facility];  Service: Cardiovascular;  Laterality: Right;  . CARDIAC CATHETERIZATION N/A 04/14/2016   Procedure: Coronary Stent Intervention;  Surgeon: Yolonda Kida, MD;  Location: [facility];  Service: Cardiovascular;  Laterality: N/A;    SOCIAL HISTORY:  Social History   Tobacco Use  . Smoking status: Never Smoker  . Smokeless tobacco: Never Used  Substance Use Topics  . Alcohol use: No    FAMILY HISTORY:  Family History  Problem Relation Age of Onset  . CAD Mother     DRUG ALLERGIES: No Known Allergies  REVIEW OF SYSTEMS:   CONSTITUTIONAL: No fever, fatigue or weakness.  EYES: No blurred or double vision.  EARS, NOSE, AND THROAT: No tinnitus or ear pain.  RESPIRATORY: No cough, shortness of breath, wheezing  or hemoptysis.  CARDIOVASCULAR: No chest pain, orthopnea, edema.  GASTROINTESTINAL: No nausea, vomiting, diarrhea or abdominal pain.  GENITOURINARY: No dysuria, hematuria.  ENDOCRINE: No polyuria, nocturia,  HEMATOLOGY: No anemia, easy bruising or bleeding SKIN: No rash or lesion. MUSCULOSKELETAL: No joint pain or arthritis.   NEUROLOGIC: No tingling, numbness, weakness.  PSYCHIATRY: No anxiety or depression.   MEDICATIONS AT HOME:  Prior to Admission medications   Medication Sig Start Date End Date Taking? Authorizing Provider  aspirin 81 MG chewable tablet Chew 1 tablet (81 mg total) by mouth daily. 04/15/16  Yes Fritzi Mandes, MD  buPROPion (WELLBUTRIN XL) 150 MG 24 hr tablet Take 150 mg by mouth daily.   Yes [provider]  citalopram (CELEXA) 20 MG tablet Take 40 mg by mouth daily.  05/15/16 12/22/18 Yes [provider]  clopidogrel (PLAVIX) 75 MG tablet Take 75 mg by mouth daily.   Yes [provider]  metoprolol succinate (TOPROL-XL) 25 MG 24 hr tablet Take 25 mg by mouth daily.   Yes [provider]  pantoprazole (PROTONIX) 20 MG tablet Take 20 mg by mouth 2 (two) times daily.   Yes [provider]  rosuvastatin (CRESTOR) 40 MG tablet Take 40 mg by mouth daily.   Yes [provider]  triamcinolone cream (KENALOG) 0.1 % Apply 1 application topically 2 (two) times daily. Small amount  To forearms  X 7 days. Patient taking differently: Apply 1 application topically 2 (two) times daily as needed (  rash). Small amount  To forearms  X 7 days. 11/12/16  Yes Ratcliffe, Heather R, PA-C      PHYSICAL EXAMINATION:   VITAL SIGNS: Blood pressure (!) 106/56, pulse 61, temperature 97.6 F (36.4 C), temperature source Oral, resp. rate 13, height 5\' 11"  (1.803 m), weight 108 kg, SpO2 96 %.  GENERAL:  58 y.o.-year-old patient lying in the bed with no acute distress.  EYES: Pupils equal, round, reactive to light and accommodation. No scleral  icterus. Extraocular muscles intact.  HEENT: Head atraumatic, normocephalic. Oropharynx and nasopharynx clear.  NECK:  Supple, no jugular venous distention. No thyroid enlargement, no tenderness.  LUNGS: Normal breath sounds bilaterally, no wheezing, rales,rhonchi or crepitation. No use of accessory muscles of respiration.  CARDIOVASCULAR: S1, S2 normal. No murmurs, rubs, or gallops.  ABDOMEN: Soft, nontender, nondistended. Bowel sounds present. No organomegaly or mass.  EXTREMITIES: No pedal edema, cyanosis, or clubbing.  NEUROLOGIC: Cranial nerves II through XII are intact. Muscle strength 5/5 in all extremities. Sensation intact. Gait not checked.  PSYCHIATRIC: The patient is alert and oriented x 3.  SKIN: No obvious rash, lesion, or ulcer.   LABORATORY PANEL:   CBC Recent Labs  Lab 12/23/18 0653  WBC 7.7  HGB 13.1  HCT 38.1*  PLT 172  MCV 86.4  MCH 29.7  MCHC 34.4  RDW 12.8   ------------------------------------------------------------------------------------------------------------------  Chemistries  No results for input(s): NA, K, CL, CO2, GLUCOSE, BUN, CREATININE, CALCIUM, MG, AST, ALT, ALKPHOS, BILITOT in the last 168 hours.  Invalid input(s): GFRCGP ------------------------------------------------------------------------------------------------------------------ CrCl cannot be calculated (Patient's most recent lab result is older than the maximum 21 days allowed.). ------------------------------------------------------------------------------------------------------------------ No results for input(s): TSH, T4TOTAL, T3FREE, THYROIDAB in the last 72 hours.  Invalid input(s): FREET3   Coagulation profile No results for input(s): INR, PROTIME in the last 168 hours. ------------------------------------------------------------------------------------------------------------------- No results for input(s): DDIMER in the last 72  hours. -------------------------------------------------------------------------------------------------------------------  Cardiac Enzymes No results for input(s): CKMB, TROPONINI, MYOGLOBIN in the last 168 hours.  Invalid input(s): CK ------------------------------------------------------------------------------------------------------------------ Invalid input(s): POCBNP  ---------------------------------------------------------------------------------------------------------------  Urinalysis No results found for: COLORURINE, APPEARANCEUR, LABSPEC, PHURINE, GLUCOSEU, HGBUR, BILIRUBINUR, KETONESUR, PROTEINUR, UROBILINOGEN, NITRITE, LEUKOCYTESUR   RADIOLOGY: No results found.  EKG: Orders placed or performed during the hospital encounter of 12/23/18  . EKG 12-Lead immediately post procedure  . EKG 12-Lead  . EKG 12-Lead immediately post procedure    IMPRESSION AND PLAN: Patient is 58 year old with coronary artery disease status post stent 1.  Coronary artery disease status post stent Continue therapy with metoprolol Continued aspirin and Plavix Crestor high-dose  2.  Hypertension continue metoprolol follow blood pressure adjust as needed  3.  Hyperlipidemia continue Crestor lipid panel in the morning  4.  Anxiety continue home anxiety medication  5.  Miscellaneous Lovenox for DVT prophylaxis    All the records are reviewed and case discussed with ED provider. Management plans discussed with the patient, family and they are in agreement.  CODE STATUS:    Code Status Orders  (From admission, onward)         Start     Ordered   12/23/18 1043  Full code  Continuous     12/23/18 1042        Code Status History    Date Active Date Inactive Code Status Order ID Comments User Context   12/23/2018 1042 12/23/2018 1042 Full Code [id number]  Isaias Cowman, MD Inpatient   04/14/2016 1630 04/14/2016 1702 Full Code [id number]  Yolonda Kida, MD  Inpatient    04/14/2016 0009 04/14/2016 1237 Full Code [id number]  Hugelmeyer, Ubaldo Glassing, DO Inpatient       TOTAL TIME TAKING CARE OF THIS PATIENT: 55 minutes.    Dustin Flock M.D on 12/23/2018 at 10:44 AM  Between 7am to 6pm - Pager - [phone]  After 6pm go to [email or web address] - password [email or web address] [facility]  [phone]  CC: Primary care physician; Valera Castle, MD

## 2018-12-24 DIAGNOSIS — Z7902 Long term (current) use of antithrombotics/antiplatelets: Secondary | ICD-10-CM | POA: Diagnosis not present

## 2018-12-24 DIAGNOSIS — Z8249 Family history of ischemic heart disease and other diseases of the circulatory system: Secondary | ICD-10-CM | POA: Diagnosis not present

## 2018-12-24 DIAGNOSIS — I2510 Atherosclerotic heart disease of native coronary artery without angina pectoris: Secondary | ICD-10-CM | POA: Diagnosis not present

## 2018-12-24 DIAGNOSIS — I25110 Atherosclerotic heart disease of native coronary artery with unstable angina pectoris: Secondary | ICD-10-CM | POA: Diagnosis not present

## 2018-12-24 DIAGNOSIS — I252 Old myocardial infarction: Secondary | ICD-10-CM | POA: Diagnosis not present

## 2018-12-24 DIAGNOSIS — Z955 Presence of coronary angioplasty implant and graft: Secondary | ICD-10-CM | POA: Diagnosis not present

## 2018-12-24 DIAGNOSIS — Z7982 Long term (current) use of aspirin: Secondary | ICD-10-CM | POA: Diagnosis not present

## 2018-12-24 DIAGNOSIS — F419 Anxiety disorder, unspecified: Secondary | ICD-10-CM | POA: Diagnosis not present

## 2018-12-24 DIAGNOSIS — E785 Hyperlipidemia, unspecified: Secondary | ICD-10-CM | POA: Diagnosis not present

## 2018-12-24 DIAGNOSIS — Z79899 Other long term (current) drug therapy: Secondary | ICD-10-CM | POA: Diagnosis not present

## 2018-12-24 DIAGNOSIS — I10 Essential (primary) hypertension: Secondary | ICD-10-CM | POA: Diagnosis not present

## 2018-12-24 LAB — CBC
HCT: 36.1 % — ABNORMAL LOW (ref 39.0–52.0)
Hemoglobin: 12 g/dL — ABNORMAL LOW (ref 13.0–17.0)
MCH: 29.4 pg (ref 26.0–34.0)
MCHC: 33.2 g/dL (ref 30.0–36.0)
MCV: 88.5 fL (ref 80.0–100.0)
Platelets: 166 10*3/uL (ref 150–400)
RBC: 4.08 MIL/uL — ABNORMAL LOW (ref 4.22–5.81)
RDW: 12.8 % (ref 11.5–15.5)
WBC: 8.7 10*3/uL (ref 4.0–10.5)
nRBC: 0 % (ref 0.0–0.2)

## 2018-12-24 LAB — BASIC METABOLIC PANEL
Anion gap: 6 (ref 5–15)
BUN: 15 mg/dL (ref 6–20)
CO2: 25 mmol/L (ref 22–32)
Calcium: 8.5 mg/dL — ABNORMAL LOW (ref 8.9–10.3)
Chloride: 108 mmol/L (ref 98–111)
Creatinine, Ser: 0.82 mg/dL (ref 0.61–1.24)
GFR calc Af Amer: >60 mL/min (ref >60)
GFR calc non Af Amer: >60 mL/min (ref >60)
Glucose, Bld: 104 mg/dL — ABNORMAL HIGH (ref 70–99)
Potassium: 4 mmol/L (ref 3.5–5.1)
Sodium: 139 mmol/L (ref 135–145)

## 2018-12-24 LAB — POCT ACTIVATED CLOTTING TIME
Activated Clotting Time: 235 seconds
Activated Clotting Time: 257 seconds
Activated Clotting Time: 268 seconds

## 2018-12-24 LAB — HEMOGLOBIN A1C
Hgb A1c MFr Bld: 5.5 % (ref 4.8–5.6)
Mean Plasma Glucose: 111.15 mg/dL

## 2018-12-24 MED ORDER — ISOSORBIDE MONONITRATE ER 30 MG PO TB24
30.0000 mg | ORAL_TABLET | Freq: Every day | ORAL | Status: DC
  Administered 2018-12-24: 30 mg via ORAL
  Filled 2018-12-24: qty 1

## 2018-12-24 MED ORDER — CYCLOBENZAPRINE HCL 10 MG PO TABS
10.0000 mg | ORAL_TABLET | Freq: Once | ORAL | Status: AC
  Administered 2018-12-24: 10 mg via ORAL
  Filled 2018-12-24: qty 1

## 2018-12-24 MED ORDER — CYCLOBENZAPRINE HCL 10 MG PO TABS: 10.0000 mg | ORAL_TABLET | Freq: Three times a day (TID) | ORAL | 0 refills | Status: DC | PRN

## 2018-12-24 MED ORDER — ISOSORBIDE MONONITRATE ER 30 MG PO TB24: 30.0000 mg | ORAL_TABLET | Freq: Every day | ORAL | 0 refills | Status: DC

## 2018-12-24 NOTE — Discharge Summary (Signed)
[facility] [place] [facility], 58 y.o., DOB 1961-03-24, MRN [id number]. Admission date: 12/23/2018 Discharge Date 12/24/2018 Primary MD Valera Castle, MD Admitting Physician Dustin Flock, MD  Admission Diagnosis  Chest pain [R07.9]  Discharge Diagnosis   Principal Problem: Unstable angina Hypertension Hyperlipidemia Coronary artery disease  Hospital Course Carlos Diaz  is a 58 y.o. male with a known history of coronary artery disease, hyperlipidemia, and hypertension who had abnormal stress test as outpatient.  He had a cardiac cath done by cardiology which showed no obstruction to the obtuse first marginal which was stented.  Patient currently denies any chest pain or shortness of breath were asked to observe the patient overnight due to having a stent by cardiology.  Patient had stent placed.  His complain of some musculoskeletal pain.  Otherwise he is doing well.  He is stable to be discharged per cardiology.            Consults  cardiology  Significant Tests:  See full reports for all details     No results found.     Today   Subjective:   Carlos Diaz patient denies any complaints  Objective:   Blood pressure 133/76, pulse (!) 55, temperature (!) 97.4 F (36.3 C), temperature source Oral, resp. rate 16, height 5\' 11"  (1.803 m), weight 109.4 kg, SpO2 100 %.  .  Intake/Output Summary (Last 24 hours) at 12/24/2018 1152 Last data filed at 12/24/2018 1021 Gross per 24 hour  Intake 246 ml  Output 0 ml  Net 246 ml    Exam VITAL SIGNS: Blood pressure 133/76, pulse (!) 55, temperature (!) 97.4 F (36.3 C), temperature source Oral, resp. rate 16, height 5\' 11"  (1.803 m), weight 109.4 kg, SpO2 100 %.  GENERAL:  58 y.o.-year-old patient lying in the bed with no acute distress.  EYES: Pupils equal, round, reactive to light and accommodation. No scleral icterus. Extraocular muscles intact.  HEENT: Head atraumatic,  normocephalic. Oropharynx and nasopharynx clear.  NECK:  Supple, no jugular venous distention. No thyroid enlargement, no tenderness.  LUNGS: Normal breath sounds bilaterally, no wheezing, rales,rhonchi or crepitation. No use of accessory muscles of respiration.  CARDIOVASCULAR: S1, S2 normal. No murmurs, rubs, or gallops.  ABDOMEN: Soft, nontender, nondistended. Bowel sounds present. No organomegaly or mass.  EXTREMITIES: No pedal edema, cyanosis, or clubbing.  NEUROLOGIC: Cranial nerves II through XII are intact. Muscle strength 5/5 in all extremities. Sensation intact. Gait not checked.  PSYCHIATRIC: The patient is alert and oriented x 3.  SKIN: No obvious rash, lesion, or ulcer.   Data Review     CBC w Diff:  Lab Results  Component Value Date   WBC 8.7 12/24/2018   HGB 12.0 (L) 12/24/2018   HCT 36.1 (L) 12/24/2018   PLT 166 12/24/2018   LYMPHOPCT 28 04/13/2016   MONOPCT 5 04/13/2016   EOSPCT 3 04/13/2016   BASOPCT 1 04/13/2016   CMP:  Lab Results  Component Value Date   NA 139 12/24/2018   NA 139 04/01/2018   K 4.0 12/24/2018   CL 108 12/24/2018   CO2 25 12/24/2018   BUN 15 12/24/2018   BUN 18 04/01/2018   CREATININE 0.82 12/24/2018   PROT 6.7 04/01/2018   ALBUMIN 4.4 04/01/2018   BILITOT 0.6 04/01/2018   ALKPHOS 59 04/01/2018   AST 16 04/01/2018   ALT 16 04/01/2018  .  Micro Results No results found for this or any previous visit (from the  past 240 hour(s)).      Code Status Orders  (From admission, onward)         Start     Ordered   12/23/18 1046  Full code  Continuous     12/23/18 1046        Code Status History    Date Active Date Inactive Code Status Order ID Comments User Context   12/23/2018 1042 12/23/2018 1046 Full Code [id number]  Dionisio Phineas, MD Inpatient   12/23/2018 1042 12/23/2018 1042 Full Code [id number]  Isaias Cowman, MD Inpatient   04/14/2016 1630 04/14/2016 1702 Full Code [id number]  Yolonda Kida, MD Inpatient    04/14/2016 0009 04/14/2016 1237 Full Code [id number]  Hugelmeyer, Ubaldo Glassing, DO Inpatient          Follow-up Information    [facility] Cardiac and Pulmonary Rehab Follow up.   Specialty:  Cardiac Rehabilitation Why:  Your Cardiologist has referred you to outpatient Cardiac Rehab at [facility].  Per your discussion with Roanna Epley, RN, Cardiac Nurse Navigator, you have declined to participate in Cardiac Rehab.   Contact information: [place] [id number] [address] [place] [phone]       Dionisio Damen, MD Follow up on 12/30/2018.   Specialty:  Cardiology Why:  at 10am  Contact information: [place] [address] [phone]           Discharge Medications   Allergies as of 12/24/2018   No Known Allergies     Medication List    TAKE these medications   aspirin 81 MG chewable tablet Chew 1 tablet (81 mg total) by mouth daily.   buPROPion 150 MG 24 hr tablet Commonly known as:  WELLBUTRIN XL Take 150 mg by mouth daily.   citalopram 20 MG tablet Commonly known as:  CELEXA Take 40 mg by mouth daily.   clopidogrel 75 MG tablet Commonly known as:  PLAVIX Take 75 mg by mouth daily.   cyclobenzaprine 10 MG tablet Commonly known as:  FLEXERIL Take 1 tablet (10 mg total) by mouth 3 (three) times daily as needed for muscle spasms.   isosorbide mononitrate 30 MG 24 hr tablet Commonly known as:  IMDUR Take 1 tablet (30 mg total) by mouth daily. Start taking on:  December 25, 2018   metoprolol succinate 25 MG 24 hr tablet Commonly known as:  TOPROL-XL Take 25 mg by mouth daily.   pantoprazole 20 MG tablet Commonly known as:  PROTONIX Take 20 mg by mouth 2 (two) times daily.   rosuvastatin 40 MG tablet Commonly known as:  CRESTOR Take 40 mg by mouth daily.   triamcinolone cream 0.1 % Commonly known as:  KENALOG Apply 1 application topically 2 (two) times daily. Small amount  To forearms  X 7 days. What changed:    when to  take this  reasons to take this          Total Time in preparing paper work, data evaluation and todays exam - 2 minutes  Dustin Flock M.D on 12/24/2018 at 11:52 AM [place]  [phone]

## 2018-12-24 NOTE — Progress Notes (Signed)
Cardiovascular and Pulmonary Nurse Navigator Note:    58 year old male with known history of coronary artery disease, HLD, HTN who had an abnormal stress test as an outpatient.  Patient presented to [facility] for outpatient Cardiac Catheterization yesterday (12/23/2018).     [name]  CARDIAC CATHETERIZATION  Order# [id number]  Reading physician: Dionisio Aneudy, MD Ordering physician: Jake Bathe, FNP Study date: 12/23/18  Physicians   Panel Physicians Referring Physician Case Authorizing Physician  Dionisio Bryler, MD (Primary)  Jake Bathe, FNP  Procedures   LEFT HEART CATH AND CORONARY ANGIOGRAPHY  Conclusion     Ost 1st Mrg lesion is 95% stenosed.  Prox LAD lesion is 40% stenosed.  Mid RCA lesion is 40% stenosed.   Mild to moderate disease in mid LAD and mid RCA with critical lesion in OM1 in the proximal portion about 95%.  Advise PCI with drug-eluting stent.   INTERVENTION:   Isaias Cowman, MD (Primary)  Jake Bathe, FNP  Procedures   CORONARY STENT INTERVENTION  Conclusion     Prox LAD lesion is 40% stenosed.  Ost 1st Mrg lesion is 95% stenosed.  Mid RCA lesion is 40% stenosed.  A drug-eluting stent was successfully placed using a [place] 2.25X15.  Post intervention, there is a 0% residual stenosis.   1.  One-vessel CAD with 95% stenosis proximal OM1 2.  Successful PCI with DES proximal OM1   EDUCATION:   Patient sitting up in recliner chair watching television.    Explained to patient given his need for a new stent yesterday, his cardiologist has referred him to outpatient cardiac rehab at [facility]. Upon asking patient if he had participated in Cardiac Rehab in 2017 when he received his first cardiac stent, patient replied, "I participated in 2017, but am not going to participate this time.  It was a good program and I enjoyed it, but do not want to participate in the program again."  NOTE:  Patient has declined  participation in Cardiac Rehab program.  Exercise discussed.  Patient reported he was pretty active up until a month ago when he started having chest pain.  He stated he knew something was wrong.  Patient stated he plans to resume exercise and really watch his diet going forward.  Patient does weights, has bike, walks, and does yard work at home for exercise.  ?  Heart healthy diet of low sodium, low fat, low cholesterol heart healthy diet discussed. Patient informed this RN that he reduced his salt intake after his NSTEMI and cardiac stent placement in 2017.  Information on heart healthy nutrition therapy provided.  ? Discussed modifiable risk factors including controlling blood pressure, cholesterol, and blood sugar; following heart healthy diet; maintaining healthy weight; exercise; and smoking cessation, if applicable.   ? Discussed cardiac medications including rationale for taking, mechanisms of action, and side effects. Stressed the importance of taking medications as prescribed.  ? Discussed emergency plan for heart attack symptoms. Patient verbalized understanding of need to call 911 and not to drive himself to ER if having cardiac symptoms / chest pain.   Smoking Cessation - Patient is a NEVER smoker.    Patient appreciative of the information.  ? Roanna Epley, RN, BSN, [place]  [facility] Cardiac & Pulmonary Rehab  Cardiovascular & Pulmonary Nurse Navigator  Direct Line: [phone]  Department Phone #: [phone] Fax: [phone]  Email Address: [email or web address]

## 2018-12-24 NOTE — Plan of Care (Signed)
  Problem: Pain Managment: Goal: General experience of comfort will improve Outcome: Progressing   Problem: Safety: Goal: Ability to remain free from injury will improve Outcome: Progressing   Problem: Cardiovascular: Goal: Vascular access site(s) Level 0-1 will be maintained Outcome: Progressing

## 2018-12-24 NOTE — Progress Notes (Signed)
Pt discharged to home via wc.  Instructions  given to pt.  Questions answered.  No distress.  

## 2018-12-24 NOTE — Progress Notes (Signed)
SUBJECTIVE: Pt is resting comfortably status post cardiac cath yesterday morning and PCI with DES. Reports some mild remaining chest pain radiating to back and down left arm, denies shortness of breath this morning.    Vitals:   12/23/18 1545 12/23/18 1934 12/24/18 0422 12/24/18 0733  BP: 115/71 123/78 (!) 141/82 133/76  Pulse: 60 (!) 59 63 (!) 55  Resp: 18 18 20 16   Temp:  98.7 F (37.1 C) (!) 97.4 F (36.3 C) (!) 97.4 F (36.3 C)  TempSrc:  Oral Oral Oral  SpO2: 100% 99% 96% 100%  Weight:   109.4 kg   Height:        Intake/Output Summary (Last 24 hours) at 12/24/2018 0904 Last data filed at 12/24/2018 0300 Gross per 24 hour  Intake 240 ml  Output 320 ml  Net -80 ml    LABS: Basic Metabolic Panel: Recent Labs    12/23/18 1542 12/24/18 0321  NA  --  139  K  --  4.0  CL  --  108  CO2  --  25  GLUCOSE  --  104*  BUN  --  15  CREATININE 0.90 0.82  CALCIUM  --  8.5*   Liver Function Tests: No results for input(s): AST, ALT, ALKPHOS, BILITOT, PROT, ALBUMIN in the last 72 hours. No results for input(s): LIPASE, AMYLASE in the last 72 hours. CBC: Recent Labs    12/23/18 1542 12/24/18 0321  WBC 8.1 8.7  HGB 13.0 12.0*  HCT 38.1* 36.1*  MCV 87.2 88.5  PLT 160 166   Cardiac Enzymes: No results for input(s): CKTOTAL, CKMB, CKMBINDEX, TROPONINI in the last 72 hours. BNP: Invalid input(s): POCBNP D-Dimer: No results for input(s): DDIMER in the last 72 hours. Hemoglobin A1C: Recent Labs    12/23/18 1542  HGBA1C 5.5   Fasting Lipid Panel: No results for input(s): CHOL, HDL, LDLCALC, TRIG, CHOLHDL, LDLDIRECT in the last 72 hours. Thyroid Function Tests: No results for input(s): TSH, T4TOTAL, T3FREE, THYROIDAB in the last 72 hours.  Invalid input(s): FREET3 Anemia Panel: No results for input(s): VITAMINB12, FOLATE, FERRITIN, TIBC, IRON, RETICCTPCT in the last 72 hours.   PHYSICAL EXAM General: Well developed, well nourished, in no acute distress HEENT:   Normocephalic and atramatic Neck:  No JVD.  Lungs: Clear bilaterally to auscultation and percussion. Heart: HRRR . Normal S1 and S2 without gallops or murmurs.  Abdomen: Bowel sounds are positive, abdomen soft and non-tender  Msk:  Back normal, normal gait. Normal strength and tone for age. Extremities: No clubbing, cyanosis or edema. Right radial cath site clean dry intact, mild edema around wrist.  Neuro: Alert and oriented X 3. Psych:  Good affect, responds appropriately  TELEMETRY: Sinus bradycardia 57bpm.  ASSESSMENT AND PLAN:  NSTEMI: Status post cardiac cath yesterday and had PCI with DES to circumflex OM1. May discharge home today and continue Plavix, Asprin, rosuvastatin 40mg , and metoprolol succinate 25mg . Adding isosorbide mono 30mg  for residual chest pain. Follow up outpatient with Dr. Humphrey Rolls next Thursday 4/30 at 10am.   Principal Problem:   Unstable angina [employer]) Active Problems:   CAD (coronary artery disease)   Chest pain    Carlos Bathe, NP-C 12/24/2018 9:04 AM Cell: [phone]

## 2018-12-27 ENCOUNTER — Encounter: Payer: Self-pay | Admitting: Cardiovascular Disease

## 2018-12-30 DIAGNOSIS — R0602 Shortness of breath: Secondary | ICD-10-CM | POA: Diagnosis not present

## 2018-12-30 DIAGNOSIS — I10 Essential (primary) hypertension: Secondary | ICD-10-CM | POA: Diagnosis not present

## 2018-12-30 DIAGNOSIS — Z9861 Coronary angioplasty status: Secondary | ICD-10-CM | POA: Diagnosis not present

## 2018-12-30 DIAGNOSIS — I2510 Atherosclerotic heart disease of native coronary artery without angina pectoris: Secondary | ICD-10-CM | POA: Diagnosis not present

## 2019-02-01 DIAGNOSIS — I10 Essential (primary) hypertension: Secondary | ICD-10-CM | POA: Diagnosis not present

## 2019-02-01 DIAGNOSIS — R0602 Shortness of breath: Secondary | ICD-10-CM | POA: Diagnosis not present

## 2019-02-01 DIAGNOSIS — I2510 Atherosclerotic heart disease of native coronary artery without angina pectoris: Secondary | ICD-10-CM | POA: Diagnosis not present

## 2019-02-01 DIAGNOSIS — Z9861 Coronary angioplasty status: Secondary | ICD-10-CM | POA: Diagnosis not present

## 2019-03-09 DIAGNOSIS — Z86018 Personal history of other benign neoplasm: Secondary | ICD-10-CM | POA: Diagnosis not present

## 2019-03-09 DIAGNOSIS — Z872 Personal history of diseases of the skin and subcutaneous tissue: Secondary | ICD-10-CM | POA: Diagnosis not present

## 2019-03-09 DIAGNOSIS — L905 Scar conditions and fibrosis of skin: Secondary | ICD-10-CM | POA: Diagnosis not present

## 2019-03-09 DIAGNOSIS — L578 Other skin changes due to chronic exposure to nonionizing radiation: Secondary | ICD-10-CM | POA: Diagnosis not present

## 2019-05-16 ENCOUNTER — Other Ambulatory Visit: Payer: Self-pay

## 2019-05-16 ENCOUNTER — Emergency Department
Admission: EM | Admit: 2019-05-16 | Discharge: 2019-05-16 | Disposition: A | Discharge status: Home / Self Care | Payer: BC Managed Care – PPO | Attending: Emergency Medicine | Admitting: Emergency Medicine

## 2019-05-16 ENCOUNTER — Encounter: Payer: Self-pay | Admitting: Emergency Medicine

## 2019-05-16 DIAGNOSIS — Z203 Contact with and (suspected) exposure to rabies: Secondary | ICD-10-CM | POA: Diagnosis not present

## 2019-05-16 DIAGNOSIS — Z79899 Other long term (current) drug therapy: Secondary | ICD-10-CM | POA: Diagnosis not present

## 2019-05-16 DIAGNOSIS — Z955 Presence of coronary angioplasty implant and graft: Secondary | ICD-10-CM | POA: Diagnosis not present

## 2019-05-16 DIAGNOSIS — I10 Essential (primary) hypertension: Secondary | ICD-10-CM | POA: Insufficient documentation

## 2019-05-16 DIAGNOSIS — R51 Headache: Secondary | ICD-10-CM | POA: Diagnosis not present

## 2019-05-16 DIAGNOSIS — Z7902 Long term (current) use of antithrombotics/antiplatelets: Secondary | ICD-10-CM | POA: Diagnosis not present

## 2019-05-16 DIAGNOSIS — I2510 Atherosclerotic heart disease of native coronary artery without angina pectoris: Secondary | ICD-10-CM | POA: Insufficient documentation

## 2019-05-16 DIAGNOSIS — F419 Anxiety disorder, unspecified: Secondary | ICD-10-CM | POA: Insufficient documentation

## 2019-05-16 DIAGNOSIS — Z7982 Long term (current) use of aspirin: Secondary | ICD-10-CM | POA: Insufficient documentation

## 2019-05-16 DIAGNOSIS — Z23 Encounter for immunization: Secondary | ICD-10-CM | POA: Diagnosis not present

## 2019-05-16 DIAGNOSIS — Z2914 Encounter for prophylactic rabies immune globin: Secondary | ICD-10-CM | POA: Insufficient documentation

## 2019-05-16 MED ORDER — RABIES IMMUNE GLOBULIN 150 UNIT/ML IM INJ
20.0000 [IU]/kg | INJECTION | Freq: Once | INTRAMUSCULAR | Status: AC
  Administered 2019-05-16: 2175 [IU] via INTRAMUSCULAR
  Filled 2019-05-16: qty 14.5

## 2019-05-16 MED ORDER — RABIES VACCINE, PCEC IM SUSR
1.0000 mL | Freq: Once | INTRAMUSCULAR | Status: AC
  Administered 2019-05-16: 1 mL via INTRAMUSCULAR
  Filled 2019-05-16: qty 1

## 2019-05-16 NOTE — ED Triage Notes (Signed)
Pt states a bat flew by him this am, wings grazed forehead. Denies pain, NAD.

## 2019-05-16 NOTE — ED Notes (Signed)
See triage note  States he was outside and a bat flew past his forehead

## 2019-05-16 NOTE — ED Provider Notes (Signed)
[facility] Emergency Department Provider Note   ____________________________________________   First MD Initiated Contact with Patient 05/16/19 1225     (approximate)  I have reviewed the triage vital signs and the nursing notes.   HISTORY  Chief Complaint Bat exposure    HPI Carlos Diaz is a 58 y.o. male patient presents with history of bat exposure.  Patient said a bat flew by him this weekend with the wings placed in his forehead.  Patient denies any bites or scratches.  Patient is requesting rabies prophylactic injections to prevent rabies.  Discussed with patient that physical exam does not warrant rabies shot.  Patient's wife is adamant he received injections.  Patient state his anxiety level has increased since the incident this morning.      Past Medical History:  Diagnosis Date  . Anxiety   . Hyperlipidemia   . Hypertension   . Lyme disease   . Myocardial infarction [facility]) 04/13/2016    Patient Active Problem List   Diagnosis Date Noted  . CAD (coronary artery disease) 12/23/2018  . Chest pain 12/23/2018  . Unstable angina ([place]) 12/22/2018  . Anxiety 06/02/2016  . Depression 06/02/2016  . Hyperlipidemia, unspecified 06/02/2016  . Hypertension 06/02/2016  . Lyme disease 06/02/2016  . NSTEMI (non-ST elevated myocardial infarction) ([place]) 04/13/2016    Past Surgical History:  Procedure Laterality Date  . CARDIAC CATHETERIZATION Right 04/14/2016   Procedure: Left Heart Cath and Coronary Angiography;  Surgeon: Dionisio Yaqub, MD;  Location: [place] CV LAB;  Service: Cardiovascular;  Laterality: Right;  . CARDIAC CATHETERIZATION N/A 04/14/2016   Procedure: Coronary Stent Intervention;  Surgeon: Yolonda Kida, MD;  Location: [place] CV LAB;  Service: Cardiovascular;  Laterality: N/A;  . CORONARY STENT INTERVENTION N/A 12/23/2018   Procedure: CORONARY STENT INTERVENTION;  Surgeon: Isaias Cowman, MD;  Location: [place] CV LAB;  Service: Cardiovascular;  Laterality: N/A;  . LEFT HEART CATH AND CORONARY ANGIOGRAPHY Left 12/23/2018   Procedure: LEFT HEART CATH AND CORONARY ANGIOGRAPHY;  Surgeon: Dionisio Thos, MD;  Location: [place] CV LAB;  Service: Cardiovascular;  Laterality: Left;    Prior to Admission medications   Medication Sig Start Date End Date Taking? Authorizing Provider  aspirin 81 MG chewable tablet Chew 1 tablet (81 mg total) by mouth daily. 04/15/16   Fritzi Mandes, MD  buPROPion (WELLBUTRIN XL) 150 MG 24 hr tablet Take 150 mg by mouth daily.    [provider]  citalopram (CELEXA) 20 MG tablet Take 40 mg by mouth daily.  05/15/16 12/22/18  [provider]  clopidogrel (PLAVIX) 75 MG tablet Take 75 mg by mouth daily.    [provider]  cyclobenzaprine (FLEXERIL) 10 MG tablet Take 1 tablet (10 mg total) by mouth 3 (three) times daily as needed for muscle spasms. 12/24/18   Dustin Flock, MD  isosorbide mononitrate (IMDUR) 30 MG 24 hr tablet Take 1 tablet (30 mg total) by mouth daily. 12/25/18   Dustin Flock, MD  metoprolol succinate (TOPROL-XL) 25 MG 24 hr tablet Take 25 mg by mouth daily.    [provider]  pantoprazole (PROTONIX) 20 MG tablet Take 20 mg by mouth 2 (two) times daily.    [provider]  rosuvastatin (CRESTOR) 40 MG tablet Take 40 mg by mouth daily.    [provider]  triamcinolone cream (KENALOG) 0.1 % Apply 1 application topically 2 (two) times daily. Small amount  To forearms  X 7  days. Patient taking differently: Apply 1 application topically 2 (two) times daily as needed (rash). Small amount  To forearms  X 7 days. 11/12/16   Talmage Nap, PA-C    Allergies Patient has no known allergies.  Family History  Problem Relation Age of Onset  . CAD Mother     Social History Social History   Tobacco Use  . Smoking status: Never Smoker  . Smokeless tobacco: Never Used  Substance Use Topics  .  Alcohol use: No  . Drug use: No    Review of Systems Constitutional: No fever/chills Eyes: No visual changes. ENT: No sore throat. Cardiovascular: Denies chest pain. Respiratory: Denies shortness of breath. Gastrointestinal: No abdominal pain.  No nausea, no vomiting.  No diarrhea.  No constipation. Genitourinary: Negative for dysuria. Musculoskeletal: Negative for back pain. Skin: Negative for rash. Neurological: Negative for headaches, focal weakness or numbness. Psychiatric:  Anxiety Endocrine:  Hypertension  ____________________________________________   PHYSICAL EXAM:  VITAL SIGNS: ED Triage Vitals  Enc Vitals Group     BP 05/16/19 1109 139/86     Pulse Rate 05/16/19 1109 63     Resp 05/16/19 1109 20     Temp 05/16/19 1109 98.6 F (37 C)     Temp Source 05/16/19 1109 Oral     SpO2 05/16/19 1109 98 %     Weight 05/16/19 1108 235 lb (106.6 kg)     Height 05/16/19 1108 6' (1.829 m)     Head Circumference --      Peak Flow --      Pain Score 05/16/19 1108 0     Pain Loc --      Pain Edu? --      Excl. in [place]? --     Constitutional: Alert and oriented.  Appears anxious. Cardiovascular: Normal rate, regular rhythm. Grossly normal heart sounds.  Good peripheral circulation. Respiratory: Normal respiratory effort.  No retractions. Lungs CTAB. Gastrointestinal: Soft and nontender. No distention. No abdominal bruits. No CVA tenderness. Musculoskeletal: No lower extremity tenderness nor edema.  No joint effusions. Neurologic:  Normal speech and language. No gross focal neurologic deficits are appreciated. No gait instability. Skin:  Skin is warm, dry and intact. No rash noted.  No skin abrasions or excoriation. Psychiatric: Mood and affect are normal. Speech and behavior are normal.  ____________________________________________   LABS (all labs ordered are listed, but only abnormal results are displayed)  Labs Reviewed - No data to display  ____________________________________________  EKG   ____________________________________________  RADIOLOGY  ED MD interpretation:    Official radiology report(s): No results found.  ____________________________________________   PROCEDURES  Procedure(s) performed (including Critical Care):  Procedures   ____________________________________________   INITIAL IMPRESSION / ASSESSMENT AND PLAN / ED COURSE  As part of my medical decision making, I reviewed the following data within the electronic MEDICAL RECORD NUMBER         Carlos Diaz was evaluated in Emergency Department on 05/16/2019 for the symptoms described in the history of present illness. He was evaluated in the context of the global COVID-19 pandemic, which necessitated consideration that the patient might be at risk for infection with the SARS-CoV-2 virus that causes COVID-19. Institutional protocols and algorithms that pertain to the evaluation of patients at risk for COVID-19 are in a state of rapid change based on information released by regulatory bodies including the CDC and federal and state organizations. These policies and algorithms were followed during the patient's care in the ED.  Patient presents with a bat exposure which he described pain being grazed by bat wing.  Discussed with patient that is present in physical exam reveals low chance of being exposed to rabies.  Patient adamant that he received injections.  Patient given discharge care instructions and advised follow-up for CDC recommendation to continue series.      ____________________________________________   FINAL CLINICAL IMPRESSION(S) / ED DIAGNOSES  Final diagnoses:  Contact with and (suspected) exposure to rabies     ED Discharge Orders    None       Note:  This document was prepared using Dragon voice recognition software and may include unintentional dictation errors.    Sable Feil, PA-C 05/16/19 1255     Earleen Newport, MD 05/16/19 [phone]

## 2019-05-16 NOTE — Discharge Instructions (Addendum)
Advised to follow-up on day 3, day 7, and day 14 to complete series.

## 2019-05-19 ENCOUNTER — Encounter: Payer: Self-pay | Admitting: Cardiovascular Disease

## 2019-05-19 ENCOUNTER — Emergency Department
Admission: EM | Admit: 2019-05-19 | Discharge: 2019-05-19 | Disposition: A | Discharge status: Home / Self Care | Payer: BC Managed Care – PPO | Attending: Emergency Medicine | Admitting: Emergency Medicine

## 2019-05-19 ENCOUNTER — Other Ambulatory Visit: Payer: Self-pay

## 2019-05-19 ENCOUNTER — Telehealth (INDEPENDENT_AMBULATORY_CARE_PROVIDER_SITE_OTHER): Payer: BC Managed Care – PPO | Admitting: Cardiovascular Disease

## 2019-05-19 ENCOUNTER — Encounter: Payer: Self-pay | Admitting: Emergency Medicine

## 2019-05-19 VITALS — BP 149/95 | HR 58 | Ht 71.0 in | Wt 235.0 lb

## 2019-05-19 DIAGNOSIS — Z7982 Long term (current) use of aspirin: Secondary | ICD-10-CM | POA: Diagnosis not present

## 2019-05-19 DIAGNOSIS — Z23 Encounter for immunization: Secondary | ICD-10-CM | POA: Insufficient documentation

## 2019-05-19 DIAGNOSIS — I10 Essential (primary) hypertension: Secondary | ICD-10-CM

## 2019-05-19 DIAGNOSIS — I252 Old myocardial infarction: Secondary | ICD-10-CM | POA: Insufficient documentation

## 2019-05-19 DIAGNOSIS — Z79899 Other long term (current) drug therapy: Secondary | ICD-10-CM | POA: Diagnosis not present

## 2019-05-19 DIAGNOSIS — I2510 Atherosclerotic heart disease of native coronary artery without angina pectoris: Secondary | ICD-10-CM | POA: Insufficient documentation

## 2019-05-19 DIAGNOSIS — E785 Hyperlipidemia, unspecified: Secondary | ICD-10-CM

## 2019-05-19 MED ORDER — RABIES VACCINE, PCEC IM SUSR
1.0000 mL | Freq: Once | INTRAMUSCULAR | Status: AC
  Administered 2019-05-19: 07:00:00 1 mL via INTRAMUSCULAR
  Filled 2019-05-19: qty 1

## 2019-05-19 NOTE — ED Triage Notes (Signed)
Patient ambulatory to triage with steady gait, without difficulty or distress noted, mask in place; pt st here for next rabies vaccine

## 2019-05-19 NOTE — Patient Instructions (Signed)
Medication Instructions:  Continue same medications.   If you need a refill on your cardiac medications before your next appointment, please call your pharmacy.   Lab work: None If you have labs (blood work) drawn today and your tests are completely normal, you will receive your results only by: Marland Kitchen MyChart Message (if you have MyChart) OR . A paper copy in the mail If you have any lab test that is abnormal or we need to change your treatment, we will call you to review the results.  Testing/Procedures: none  Follow-Up: At [facility], you and your health needs are our priority.  As part of our continuing mission to provide you with exceptional heart care, we have created designated Provider Care Teams.  These Care Teams include your primary Cardiologist (physician) and Advanced Practice Providers (APPs -  Physician Assistants and Nurse Practitioners) who all work together to provide you with the care you need, when you need it. You will need a follow up appointment in 3 months.  Please call our office 2 months in advance to schedule this appointment.  You may see No primary care provider on file. or one of the following Advanced Practice Providers on your designated Care Team:   Murray Hodgkins, NP Christell Faith, PA-C Marrianne Mood, [place]

## 2019-05-19 NOTE — Progress Notes (Signed)
Virtual Visit via Video Note   This visit type was conducted due to national recommendations for restrictions regarding the COVID-19 Pandemic (e.g. social distancing) in an effort to limit this patient's exposure and mitigate transmission in our community.  Due to his co-morbid illnesses, this patient is at least at moderate risk for complications without adequate follow up.  This format is felt to be most appropriate for this patient at this time.  All issues noted in this document were discussed and addressed.  A limited physical exam was performed with this format.  Please refer to the patient's chart for his consent to telehealth for [facility].   Date:  05/19/2019   ID:  Carlos Diaz, DOB 06-May-1961, MRN [id number]  Patient Location: Home Provider Location: Home  PCP:  Carlos Castle, MD  Cardiologist:  No primary care provider on file.  Electrophysiologist:  None   Evaluation Performed:  New Patient Evaluation  Chief Complaint:  Chest pain  History of Present Illness:    Carlos Diaz is a 58 y.o. male who was seen via video visit to establish as a new patient.  He is transferring care from Dr. Humphrey Rolls.  He has known history of coronary artery disease and presented with non-ST elevation myocardial infarction in 2017.  He was found to have high-grade stenosis in the ramus intermedius which was treated with PCI and drug-eluting stent placement.  He had recurrent anginal symptoms this year.  He underwent repeat cardiac catheterization which showed patent ramus stent.  There was high-grade stenosis and proximal OM branch which was treated successfully with PCI and drug-eluting stent placement.  I reviewed the cath images.  There was moderate proximal to mid LAD disease and mild RCA disease.  Other medical problems include essential hypertension and hyperlipidemia.  He is not a smoker and has no family history of coronary artery disease.  He works at [employer]. He has been  doing reasonably well.  He describes left-sided chest pain which is different from his prior angina.  This is described as aching that started after shooting some arrows.  It is not exertional.  There is no associated shortness of breath.  The patient does not have symptoms concerning for COVID-19 infection (fever, chills, cough, or new shortness of breath).    Past Medical History:  Diagnosis Date  . Anxiety   . Hyperlipidemia   . Hypertension   . Lyme disease   . Myocardial infarction ([place]) 04/13/2016   Past Surgical History:  Procedure Laterality Date  . CARDIAC CATHETERIZATION Right 04/14/2016   Procedure: Left Heart Cath and Coronary Angiography;  Surgeon: Carlos Kion, MD;  Location: [place] CV LAB;  Service: Cardiovascular;  Laterality: Right;  . CARDIAC CATHETERIZATION N/A 04/14/2016   Procedure: Coronary Stent Intervention;  Surgeon: Carlos Kida, MD;  Location: [place] CV LAB;  Service: Cardiovascular;  Laterality: N/A;  . CORONARY STENT INTERVENTION N/A 12/23/2018   Procedure: CORONARY STENT INTERVENTION;  Surgeon: Carlos Cowman, MD;  Location: [place] CV LAB;  Service: Cardiovascular;  Laterality: N/A;  . LEFT HEART CATH AND CORONARY ANGIOGRAPHY Left 12/23/2018   Procedure: LEFT HEART CATH AND CORONARY ANGIOGRAPHY;  Surgeon: Carlos Jameir, MD;  Location: [place] CV LAB;  Service: Cardiovascular;  Laterality: Left;     Current Meds  Medication Sig  . aspirin 81 MG chewable tablet Chew 1 tablet (81 mg total) by mouth daily.  Marland Kitchen buPROPion (WELLBUTRIN XL) 150 MG 24 hr tablet Take 150  mg by mouth daily.  . citalopram (CELEXA) 20 MG tablet Take 40 mg by mouth daily.   . clopidogrel (PLAVIX) 75 MG tablet Take 75 mg by mouth daily.  . cyclobenzaprine (FLEXERIL) 10 MG tablet Take 1 tablet (10 mg total) by mouth 3 (three) times daily as needed for muscle spasms.  . isosorbide mononitrate (IMDUR) 30 MG 24 hr tablet Take 1 tablet (30 mg total) by  mouth daily.  . metoprolol succinate (TOPROL-XL) 25 MG 24 hr tablet Take 25 mg by mouth daily.  . pantoprazole (PROTONIX) 20 MG tablet Take 20 mg by mouth 2 (two) times daily.  . rosuvastatin (CRESTOR) 40 MG tablet Take 40 mg by mouth daily.  Marland Kitchen triamcinolone cream (KENALOG) 0.1 % Apply 1 application topically 2 (two) times daily. Small amount  To forearms  X 7 days. (Patient taking differently: Apply 1 application topically 2 (two) times daily as needed (rash). Small amount  To forearms  X 7 days.)     Allergies:   Patient has no known allergies.   Social History   Tobacco Use  . Smoking status: Never Smoker  . Smokeless tobacco: Never Used  Substance Use Topics  . Alcohol use: No  . Drug use: No     Family Hx: The patient's family history includes CAD in his mother.  ROS:   Please see the history of present illness.     All other systems reviewed and are negative.   Prior CV studies:   The following studies were reviewed today:  I reviewed most recent cardiac catheterization images  Labs/Other Tests and Data Reviewed:    EKG:  No ECG reviewed.  Recent Labs: 12/24/2018: BUN 15; Creatinine, Ser 0.82; Hemoglobin 12.0; Platelets 166; Potassium 4.0; Sodium 139   Recent Lipid Panel Lab Results  Component Value Date/Time   CHOL 192 04/14/2016 06:23 AM   TRIG 154 (H) 04/14/2016 06:23 AM   HDL 35 (L) 04/14/2016 06:23 AM   CHOLHDL 5.5 04/14/2016 06:23 AM   LDLCALC 126 (H) 04/14/2016 06:23 AM    Wt Readings from Last 3 Encounters:  05/19/19 235 lb (106.6 kg)  05/19/19 237 lb 7 oz (107.7 kg)  05/16/19 237 lb 6.4 oz (107.7 kg)     Objective:    Vital Signs:  BP (!) 149/95   Pulse (!) 58   Ht 5\' 11"  (1.803 m)   Wt 235 lb (106.6 kg)   BMI 32.78 kg/m    VITAL SIGNS:  reviewed GEN:  no acute distress EYES:  sclerae anicteric, EOMI - Extraocular Movements Intact RESPIRATORY:  normal respiratory effort, symmetric expansion SKIN:  no rash, lesions or ulcers.  MUSCULOSKELETAL:  no obvious deformities. NEURO:  alert and oriented x 3, no obvious focal deficit PSYCH:  normal affect  ASSESSMENT & PLAN:    1. Coronary artery disease involving native coronary arteries without angina: He is doing well overall.  Current chest pain seems to be musculoskeletal and different from his prior angina.  Continue dual antiplatelet therapy.  Continue aggressive treatment of risk factors.  The patient does have residual disease in the LAD and RCA which was not obstructive but requires aggressive treatment.  2.  Hyperlipidemia: Continue high-dose rosuvastatin with a target LDL of less than 70.  3.  Essential hypertension: Blood pressure is elevated today with slight bradycardia.  Continue to monitor for now and consider switching metoprolol to carvedilol.  COVID-19 Education: The signs and symptoms of COVID-19 were discussed with the patient and how  to seek care for testing (follow up with PCP or arrange E-visit).  The importance of social distancing was discussed today.  Time:   Today, I have spent 12 minutes with the patient with telehealth technology discussing the above problems.     Medication Adjustments/Labs and Tests Ordered: Current medicines are reviewed at length with the patient today.  Concerns regarding medicines are outlined above.   Tests Ordered: No orders of the defined types were placed in this encounter.   Medication Changes: No orders of the defined types were placed in this encounter.   Follow Up:  In Person in 3 month(s)  Signed, Kathlyn Sacramento, MD  05/19/2019 8:49 AM    [place]

## 2019-05-19 NOTE — ED Provider Notes (Signed)
[facility] Emergency Department Provider Note  ____________________________________________   First MD Initiated Contact with Patient 05/19/19 [phone]     (approximate)  I have reviewed the triage vital signs and the nursing notes.   HISTORY  Chief Complaint Rabies Injection   HPI Carlos Diaz is a 58 y.o. male presents to the ED for continued rabies vaccine.  He states only a mild GI upset for 1 day which may have not been related to the rabies vaccine.     Past Medical History:  Diagnosis Date  . Anxiety   . Hyperlipidemia   . Hypertension   . Lyme disease   . Myocardial infarction [facility]) 04/13/2016    Patient Active Problem List   Diagnosis Date Noted  . CAD (coronary artery disease) 12/23/2018  . Chest pain 12/23/2018  . Unstable angina ([place]) 12/22/2018  . Anxiety 06/02/2016  . Depression 06/02/2016  . Hyperlipidemia, unspecified 06/02/2016  . Hypertension 06/02/2016  . Lyme disease 06/02/2016  . NSTEMI (non-ST elevated myocardial infarction) ([place]) 04/13/2016    Past Surgical History:  Procedure Laterality Date  . CARDIAC CATHETERIZATION Right 04/14/2016   Procedure: Left Heart Cath and Coronary Angiography;  Surgeon: Dionisio Noal, MD;  Location: [place] [facility];  Service: Cardiovascular;  Laterality: Right;  . CARDIAC CATHETERIZATION N/A 04/14/2016   Procedure: Coronary Stent Intervention;  Surgeon: Yolonda Kida, MD;  Location: [place] [facility];  Service: Cardiovascular;  Laterality: N/A;  . CORONARY STENT INTERVENTION N/A 12/23/2018   Procedure: CORONARY STENT INTERVENTION;  Surgeon: Isaias Cowman, MD;  Location: [place] [facility];  Service: Cardiovascular;  Laterality: N/A;  . LEFT HEART CATH AND CORONARY ANGIOGRAPHY Left 12/23/2018   Procedure: LEFT HEART CATH AND CORONARY ANGIOGRAPHY;  Surgeon: Dionisio Derris, MD;  Location: [place] [facility];  Service: Cardiovascular;  Laterality: Left;    Prior to  Admission medications   Medication Sig Start Date End Date Taking? Authorizing Provider  aspirin 81 MG chewable tablet Chew 1 tablet (81 mg total) by mouth daily. 04/15/16   Fritzi Mandes, MD  buPROPion (WELLBUTRIN XL) 150 MG 24 hr tablet Take 150 mg by mouth daily.    [provider]  citalopram (CELEXA) 20 MG tablet Take 40 mg by mouth daily.  05/15/16 12/22/18  [provider]  clopidogrel (PLAVIX) 75 MG tablet Take 75 mg by mouth daily.    [provider]  cyclobenzaprine (FLEXERIL) 10 MG tablet Take 1 tablet (10 mg total) by mouth 3 (three) times daily as needed for muscle spasms. 12/24/18   Dustin Flock, MD  isosorbide mononitrate (IMDUR) 30 MG 24 hr tablet Take 1 tablet (30 mg total) by mouth daily. 12/25/18   Dustin Flock, MD  metoprolol succinate (TOPROL-XL) 25 MG 24 hr tablet Take 25 mg by mouth daily.    [provider]  pantoprazole (PROTONIX) 20 MG tablet Take 20 mg by mouth 2 (two) times daily.    [provider]  rosuvastatin (CRESTOR) 40 MG tablet Take 40 mg by mouth daily.    [provider]  triamcinolone cream (KENALOG) 0.1 % Apply 1 application topically 2 (two) times daily. Small amount  To forearms  X 7 days. Patient taking differently: Apply 1 application topically 2 (two) times daily as needed (rash). Small amount  To forearms  X 7 days. 11/12/16   Talmage Nap, PA-C    Allergies Patient has no known allergies.  Family History  Problem Relation Age of  Onset  . CAD Mother     Social History Social History   Tobacco Use  . Smoking status: Never Smoker  . Smokeless tobacco: Never Used  Substance Use Topics  . Alcohol use: No  . Drug use: No    Review of Systems Constitutional: No fever/chills Cardiovascular: Denies chest pain. Respiratory: Denies shortness of breath. Gastrointestinal: No abdominal pain.  No nausea, no vomiting.  Genitourinary: Negative for dysuria. Musculoskeletal: Negative for  muscle aches Skin: Negative for rash. Neurological: Negative for headaches, focal weakness or numbness. ____________________________________________   PHYSICAL EXAM:  VITAL SIGNS: ED Triage Vitals  Enc Vitals Group     BP 05/19/19 0701 (!) 149/95     Pulse Rate 05/19/19 0701 (!) 58     Resp 05/19/19 0701 20     Temp 05/19/19 0701 98.4 F (36.9 C)     Temp Source 05/19/19 0701 Oral     SpO2 05/19/19 0701 100 %     Weight 05/19/19 0655 237 lb 7 oz (107.7 kg)     Height 05/19/19 0655 6' (1.829 m)     Head Circumference --      Peak Flow --      Pain Score --      Pain Loc --      Pain Edu? --      Excl. in [place]? --    Constitutional: Alert and oriented. Well appearing and in no acute distress. Eyes: Conjunctivae are normal.  Head: Atraumatic. Neck: No stridor.   Cardiovascular: Normal rate, regular rhythm. Grossly normal heart sounds.  Good peripheral circulation. Respiratory: Normal respiratory effort.  No retractions. Lungs CTAB. Musculoskeletal: No lower extremity tenderness nor edema.  No joint effusions. Neurologic:  Normal speech and language. No gross focal neurologic deficits are appreciated. No gait instability. Skin:  Skin is warm, dry and intact.  Psychiatric: Mood and affect are normal. Speech and behavior are normal.  ____________________________________________   LABS (all labs ordered are listed, but only abnormal results are displayed)  Labs Reviewed - No data to display   PROCEDURES  Procedure(s) performed (including Critical Care):  Procedures  ___________________________________________   INITIAL IMPRESSION / ASSESSMENT AND PLAN / ED COURSE  As part of my medical decision making, I reviewed the following data within the electronic MEDICAL RECORD NUMBER Notes from prior ED visits and  Controlled Substance Database  58 year old male presents to the ED for continued rabies vaccine.  Patient reports only mild GI disturbance for 1 day which may or may  not be related to the rabies vaccine.  Patient has a list of days for him to return to complete his rabies vaccine series.  ____________________________________________   FINAL CLINICAL IMPRESSION(S) / ED DIAGNOSES  Final diagnoses:  None     ED Discharge Orders    None       Note:  This document was prepared using Dragon voice recognition software and may include unintentional dictation errors.    Johnn Hai, PA-C 05/19/19 [id number]    Nena Polio, MD 05/19/19 [phone]

## 2019-05-19 NOTE — Discharge Instructions (Addendum)
Return to ED for remaining rabies injections

## 2019-05-19 NOTE — ED Notes (Signed)
See triage note  Here for rabies vaccine 

## 2019-05-23 ENCOUNTER — Other Ambulatory Visit: Payer: Self-pay

## 2019-05-23 ENCOUNTER — Encounter: Payer: Self-pay | Admitting: Emergency Medicine

## 2019-05-23 ENCOUNTER — Emergency Department
Admission: EM | Admit: 2019-05-23 | Discharge: 2019-05-23 | Disposition: A | Discharge status: Home / Self Care | Payer: BC Managed Care – PPO | Attending: Student in an Organized Health Care Education/Training Program | Admitting: Student in an Organized Health Care Education/Training Program

## 2019-05-23 DIAGNOSIS — Z23 Encounter for immunization: Secondary | ICD-10-CM | POA: Insufficient documentation

## 2019-05-23 DIAGNOSIS — I252 Old myocardial infarction: Secondary | ICD-10-CM | POA: Insufficient documentation

## 2019-05-23 DIAGNOSIS — Z79899 Other long term (current) drug therapy: Secondary | ICD-10-CM | POA: Insufficient documentation

## 2019-05-23 DIAGNOSIS — Z203 Contact with and (suspected) exposure to rabies: Secondary | ICD-10-CM | POA: Diagnosis not present

## 2019-05-23 DIAGNOSIS — I2510 Atherosclerotic heart disease of native coronary artery without angina pectoris: Secondary | ICD-10-CM | POA: Insufficient documentation

## 2019-05-23 DIAGNOSIS — I10 Essential (primary) hypertension: Secondary | ICD-10-CM | POA: Insufficient documentation

## 2019-05-23 DIAGNOSIS — Z7901 Long term (current) use of anticoagulants: Secondary | ICD-10-CM | POA: Diagnosis not present

## 2019-05-23 MED ORDER — RABIES VACCINE, PCEC IM SUSR
1.0000 mL | Freq: Once | INTRAMUSCULAR | Status: AC
  Administered 2019-05-23: 1 mL via INTRAMUSCULAR
  Filled 2019-05-23: qty 1

## 2019-05-23 NOTE — ED Triage Notes (Signed)
Pt has arrived for his next rabies vaccine;

## 2019-05-23 NOTE — ED Notes (Signed)
See triage note   Here for rabies  No other complaints

## 2019-05-23 NOTE — ED Provider Notes (Signed)
[facility] Emergency Department Provider Note   ____________________________________________   First MD Initiated Contact with Patient 05/23/19 [phone]     (approximate)  I have reviewed the triage vital signs and the nursing notes.   HISTORY  Chief Complaint Rabies Injection    HPI Thomasjames Petrella is a 58 y.o. male patient is a day for day 7 rabies injection secondary to close encounter for bat.  Patient denies scratches or bites from incident.  Patient states no adverse reactions previous injections.       Past Medical History:  Diagnosis Date  . Anxiety   . Hyperlipidemia   . Hypertension   . Lyme disease   . Myocardial infarction [facility]) 04/13/2016    Patient Active Problem List   Diagnosis Date Noted  . CAD (coronary artery disease) 12/23/2018  . Chest pain 12/23/2018  . Unstable angina ([place]) 12/22/2018  . Anxiety 06/02/2016  . Depression 06/02/2016  . Hyperlipidemia, unspecified 06/02/2016  . Hypertension 06/02/2016  . Lyme disease 06/02/2016  . NSTEMI (non-ST elevated myocardial infarction) ([place]) 04/13/2016    Past Surgical History:  Procedure Laterality Date  . CARDIAC CATHETERIZATION Right 04/14/2016   Procedure: Left Heart Cath and Coronary Angiography;  Surgeon: Dionisio Haidyn, MD;  Location: [place] [facility];  Service: Cardiovascular;  Laterality: Right;  . CARDIAC CATHETERIZATION N/A 04/14/2016   Procedure: Coronary Stent Intervention;  Surgeon: Yolonda Kida, MD;  Location: [place] [facility];  Service: Cardiovascular;  Laterality: N/A;  . CORONARY STENT INTERVENTION N/A 12/23/2018   Procedure: CORONARY STENT INTERVENTION;  Surgeon: Isaias Cowman, MD;  Location: [place] [facility];  Service: Cardiovascular;  Laterality: N/A;  . LEFT HEART CATH AND CORONARY ANGIOGRAPHY Left 12/23/2018   Procedure: LEFT HEART CATH AND CORONARY ANGIOGRAPHY;  Surgeon: Dionisio Vinod, MD;  Location: [place] [facility];  Service:  Cardiovascular;  Laterality: Left;    Prior to Admission medications   Medication Sig Start Date End Date Taking? Authorizing Provider  aspirin 81 MG chewable tablet Chew 1 tablet (81 mg total) by mouth daily. 04/15/16   Fritzi Mandes, MD  buPROPion (WELLBUTRIN XL) 150 MG 24 hr tablet Take 150 mg by mouth daily.    [provider]  citalopram (CELEXA) 20 MG tablet Take 40 mg by mouth daily.  05/15/16 05/19/19  [provider]  clopidogrel (PLAVIX) 75 MG tablet Take 75 mg by mouth daily.    [provider]  cyclobenzaprine (FLEXERIL) 10 MG tablet Take 1 tablet (10 mg total) by mouth 3 (three) times daily as needed for muscle spasms. 12/24/18   Dustin Flock, MD  isosorbide mononitrate (IMDUR) 30 MG 24 hr tablet Take 1 tablet (30 mg total) by mouth daily. 12/25/18   Dustin Flock, MD  metoprolol succinate (TOPROL-XL) 25 MG 24 hr tablet Take 25 mg by mouth daily.    [provider]  pantoprazole (PROTONIX) 20 MG tablet Take 20 mg by mouth 2 (two) times daily.    [provider]  rosuvastatin (CRESTOR) 40 MG tablet Take 40 mg by mouth daily.    [provider]  triamcinolone cream (KENALOG) 0.1 % Apply 1 application topically 2 (two) times daily. Small amount  To forearms  X 7 days. Patient taking differently: Apply 1 application topically 2 (two) times daily as needed (rash). Small amount  To forearms  X 7 days. 11/12/16   Talmage Nap, PA-C    Allergies Patient has no known allergies.  Family  History  Problem Relation Age of Onset  . CAD Mother     Social History Social History   Tobacco Use  . Smoking status: Never Smoker  . Smokeless tobacco: Never Used  Substance Use Topics  . Alcohol use: No  . Drug use: No    Review of Systems Constitutional: No fever/chills Eyes: No visual changes. ENT: No sore throat. Cardiovascular: Denies chest pain. Respiratory: Denies shortness of breath. Gastrointestinal: No abdominal pain.   No nausea, no vomiting.  No diarrhea.  No constipation. Genitourinary: Negative for dysuria. Musculoskeletal: Negative for back pain. Skin: Negative for rash. Neurological: Negative for headaches, focal weakness or numbness. Psychiatric:  Anxiety Endocrine:  Hyperlipidemia and hypertension. ____________________________________________   PHYSICAL EXAM:  VITAL SIGNS: ED Triage Vitals  Enc Vitals Group     BP 05/23/19 0639 (!) 151/97     Pulse Rate 05/23/19 0639 61     Resp 05/23/19 0639 16     Temp 05/23/19 0639 98.6 F (37 C)     Temp Source 05/23/19 0639 Oral     SpO2 05/23/19 0639 99 %     Weight 05/23/19 0640 235 lb (106.6 kg)     Height 05/23/19 0640 5\' 11"  (1.803 m)     Head Circumference --      Peak Flow --      Pain Score 05/23/19 0640 0     Pain Loc --      Pain Edu? --      Excl. in [place]? --     Constitutional: Alert and oriented. Well appearing and in no acute distress. Cardiovascular: Normal rate, regular rhythm. Grossly normal heart sounds.  Good peripheral circulation. Respiratory: Normal respiratory effort.  No retractions. Lungs CTAB. Neurologic:  Normal speech and language. No gross focal neurologic deficits are appreciated. No gait instability. Skin:  Skin is warm, dry and intact. No rash noted. Psychiatric: Mood and affect are normal. Speech and behavior are normal.  ____________________________________________   LABS (all labs ordered are listed, but only abnormal results are displayed)  Labs Reviewed - No data to display ____________________________________________  EKG   ____________________________________________  RADIOLOGY  ED MD interpretation:    Official radiology report(s): No results found.  ____________________________________________   PROCEDURES  Procedure(s) performed (including Critical Care):  Procedures   ____________________________________________   INITIAL IMPRESSION / ASSESSMENT AND PLAN / ED COURSE  As  part of my medical decision making, I reviewed the following data within the electronic MEDICAL RECORD NUMBER         Cortlandt Kosky was evaluated in Emergency Department on 05/23/2019 for the symptoms described in the history of present illness. He was evaluated in the context of the global COVID-19 pandemic, which necessitated consideration that the patient might be at risk for infection with the SARS-CoV-2 virus that causes COVID-19. Institutional protocols and algorithms that pertain to the evaluation of patients at risk for COVID-19 are in a state of rapid change based on information released by regulatory bodies including the CDC and federal and state organizations. These policies and algorithms were followed during the patient's care in the ED.  Patient is here for day 7 of rabies injection secondary to close exposure of the back.  Patient given discharge care and advised to follow-up to complete series.      ____________________________________________   FINAL CLINICAL IMPRESSION(S) / ED DIAGNOSES  Final diagnoses:  Contact with and (suspected) exposure to rabies     ED Discharge Orders    None  Note:  This document was prepared using Dragon voice recognition software and may include unintentional dictation errors.    Sable Feil, PA-C 05/23/19 [id number]    Merlyn Lot, MD 05/23/19 1006

## 2019-05-23 NOTE — Discharge Instructions (Addendum)
Follow-up per CDC scheduled to complete series of rabies injections.

## 2019-05-30 ENCOUNTER — Emergency Department
Admission: EM | Admit: 2019-05-30 | Discharge: 2019-05-30 | Disposition: A | Discharge status: Home / Self Care | Payer: BC Managed Care – PPO | Attending: Student in an Organized Health Care Education/Training Program | Admitting: Student in an Organized Health Care Education/Training Program

## 2019-05-30 ENCOUNTER — Encounter: Payer: Self-pay | Admitting: Emergency Medicine

## 2019-05-30 ENCOUNTER — Other Ambulatory Visit: Payer: Self-pay

## 2019-05-30 DIAGNOSIS — Z7982 Long term (current) use of aspirin: Secondary | ICD-10-CM | POA: Diagnosis not present

## 2019-05-30 DIAGNOSIS — Z955 Presence of coronary angioplasty implant and graft: Secondary | ICD-10-CM | POA: Diagnosis not present

## 2019-05-30 DIAGNOSIS — I10 Essential (primary) hypertension: Secondary | ICD-10-CM | POA: Diagnosis not present

## 2019-05-30 DIAGNOSIS — I25110 Atherosclerotic heart disease of native coronary artery with unstable angina pectoris: Secondary | ICD-10-CM | POA: Diagnosis not present

## 2019-05-30 DIAGNOSIS — I252 Old myocardial infarction: Secondary | ICD-10-CM | POA: Diagnosis not present

## 2019-05-30 DIAGNOSIS — Z203 Contact with and (suspected) exposure to rabies: Secondary | ICD-10-CM | POA: Diagnosis not present

## 2019-05-30 DIAGNOSIS — Z79899 Other long term (current) drug therapy: Secondary | ICD-10-CM | POA: Insufficient documentation

## 2019-05-30 DIAGNOSIS — Z23 Encounter for immunization: Secondary | ICD-10-CM | POA: Insufficient documentation

## 2019-05-30 DIAGNOSIS — Z7902 Long term (current) use of antithrombotics/antiplatelets: Secondary | ICD-10-CM | POA: Diagnosis not present

## 2019-05-30 MED ORDER — RABIES VACCINE, PCEC IM SUSR
1.0000 mL | Freq: Once | INTRAMUSCULAR | Status: AC
  Administered 2019-05-30: 07:00:00 1 mL via INTRAMUSCULAR
  Filled 2019-05-30: qty 1

## 2019-05-30 NOTE — ED Notes (Signed)
Here for last rabies vaccine  

## 2019-05-30 NOTE — ED Provider Notes (Signed)
[facility] Emergency Department Provider Note   ____________________________________________   First MD Initiated Contact with Patient 05/30/19 0701     (approximate)  I have reviewed the triage vital signs and the nursing notes.   HISTORY  Chief Complaint Rabies Injection    HPI Carlos Diaz is a 58 y.o. male patient presents for final rabies shot secondary to suspected exposure to a bat.  Patient denies scratches of bites from the incident.  Patient denies any adverse reaction from previous injections.         Past Medical History:  Diagnosis Date  . Anxiety   . Hyperlipidemia   . Hypertension   . Lyme disease   . Myocardial infarction [facility]) 04/13/2016    Patient Active Problem List   Diagnosis Date Noted  . CAD (coronary artery disease) 12/23/2018  . Chest pain 12/23/2018  . Unstable angina ([place]) 12/22/2018  . Anxiety 06/02/2016  . Depression 06/02/2016  . Hyperlipidemia, unspecified 06/02/2016  . Hypertension 06/02/2016  . Lyme disease 06/02/2016  . NSTEMI (non-ST elevated myocardial infarction) ([place]) 04/13/2016    Past Surgical History:  Procedure Laterality Date  . CARDIAC CATHETERIZATION Right 04/14/2016   Procedure: Left Heart Cath and Coronary Angiography;  Surgeon: Dionisio Nicco, MD;  Location: [place] [facility];  Service: Cardiovascular;  Laterality: Right;  . CARDIAC CATHETERIZATION N/A 04/14/2016   Procedure: Coronary Stent Intervention;  Surgeon: Yolonda Kida, MD;  Location: [place] [facility];  Service: Cardiovascular;  Laterality: N/A;  . CORONARY STENT INTERVENTION N/A 12/23/2018   Procedure: CORONARY STENT INTERVENTION;  Surgeon: Isaias Cowman, MD;  Location: [place] [facility];  Service: Cardiovascular;  Laterality: N/A;  . LEFT HEART CATH AND CORONARY ANGIOGRAPHY Left 12/23/2018   Procedure: LEFT HEART CATH AND CORONARY ANGIOGRAPHY;  Surgeon: Dionisio Rocky, MD;  Location: [place] [facility];   Service: Cardiovascular;  Laterality: Left;    Prior to Admission medications   Medication Sig Start Date End Date Taking? Authorizing Provider  aspirin 81 MG chewable tablet Chew 1 tablet (81 mg total) by mouth daily. 04/15/16   Fritzi Mandes, MD  buPROPion (WELLBUTRIN XL) 150 MG 24 hr tablet Take 150 mg by mouth daily.    [provider]  citalopram (CELEXA) 20 MG tablet Take 40 mg by mouth daily.  05/15/16 05/19/19  [provider]  clopidogrel (PLAVIX) 75 MG tablet Take 75 mg by mouth daily.    [provider]  cyclobenzaprine (FLEXERIL) 10 MG tablet Take 1 tablet (10 mg total) by mouth 3 (three) times daily as needed for muscle spasms. 12/24/18   Dustin Flock, MD  isosorbide mononitrate (IMDUR) 30 MG 24 hr tablet Take 1 tablet (30 mg total) by mouth daily. 12/25/18   Dustin Flock, MD  metoprolol succinate (TOPROL-XL) 25 MG 24 hr tablet Take 25 mg by mouth daily.    [provider]  pantoprazole (PROTONIX) 20 MG tablet Take 20 mg by mouth 2 (two) times daily.    [provider]  rosuvastatin (CRESTOR) 40 MG tablet Take 40 mg by mouth daily.    [provider]  triamcinolone cream (KENALOG) 0.1 % Apply 1 application topically 2 (two) times daily. Small amount  To forearms  X 7 days. Patient taking differently: Apply 1 application topically 2 (two) times daily as needed (rash). Small amount  To forearms  X 7 days. 11/12/16   Talmage Nap, PA-C    Allergies Patient has no known allergies.  Family History  Problem Relation Age of Onset  . CAD Mother     Social History Social History   Tobacco Use  . Smoking status: Never Smoker  . Smokeless tobacco: Never Used  Substance Use Topics  . Alcohol use: No  . Drug use: No    Review of Systems Constitutional: No fever/chills Eyes: No visual changes. ENT: No sore throat. Cardiovascular: Denies chest pain. Respiratory: Denies shortness of breath. Gastrointestinal: No  abdominal pain.  No nausea, no vomiting.  No diarrhea.  No constipation. Genitourinary: Negative for dysuria. Musculoskeletal: Negative for back pain. Skin: Negative for rash. Neurological: Negative for headaches, focal weakness or numbness. {**Psychiatric:  Anxiety Endocrine:  Hyperlipidemia, hypertension, and Lyme disease. ____________________________________________   PHYSICAL EXAM:  VITAL SIGNS: ED Triage Vitals  Enc Vitals Group     BP 05/30/19 0653 (!) 147/94     Pulse Rate 05/30/19 0653 60     Resp 05/30/19 0653 18     Temp 05/30/19 0653 98.5 F (36.9 C)     Temp Source 05/30/19 0653 Oral     SpO2 05/30/19 0653 99 %     Weight 05/30/19 0654 235 lb (106.6 kg)     Height 05/30/19 0654 6' (1.829 m)     Head Circumference --      Peak Flow --      Pain Score 05/30/19 0654 0     Pain Loc --      Pain Edu? --      Excl. in [place]? --    Constitutional: Alert and oriented. Well appearing and in no acute distress. Cardiovascular: Normal rate, regular rhythm. Grossly normal heart sounds.  Good peripheral circulation. Respiratory: Normal respiratory effort.  No retractions. Lungs CTAB. Neurologic:  Normal speech and language. No gross focal neurologic deficits are appreciated. No gait instability. Skin:  Skin is warm, dry and intact. No rash noted. Psychiatric: Mood and affect are normal. Speech and behavior are normal.  ____________________________________________   LABS (all labs ordered are listed, but only abnormal results are displayed)  Labs Reviewed - No data to display ____________________________________________  EKG   ____________________________________________  RADIOLOGY  ED MD interpretation:    Official radiology report(s): No results found.  ____________________________________________   PROCEDURES  Procedure(s) performed (including Critical Care):  Procedures   ____________________________________________   INITIAL IMPRESSION /  ASSESSMENT AND PLAN / ED COURSE  As part of my medical decision making, I reviewed the following data within the electronic MEDICAL RECORD NUMBER         [name] was evaluated in Emergency Department on 05/30/2019 for the symptoms described in the history of present illness. He was evaluated in the context of the global COVID-19 pandemic, which necessitated consideration that the patient might be at risk for infection with the SARS-CoV-2 virus that causes COVID-19. Institutional protocols and algorithms that pertain to the evaluation of patients at risk for COVID-19 are in a state of rapid change based on information released by regulatory bodies including the CDC and federal and state organizations. These policies and algorithms were followed during the patient's care in the ED.  Patient presents for final rabies injection.  Patient given discharge care instructions.      ____________________________________________   FINAL CLINICAL IMPRESSION(S) / ED DIAGNOSES  Final diagnoses:  Contact with and (suspected) exposure to rabies     ED Discharge Orders    None       Note:  This document was prepared using Dragon voice  recognition software and may include unintentional dictation errors.    Sable Feil, PA-C 05/30/19 [id number]    Merlyn Lot, MD 05/30/19 1254

## 2019-05-30 NOTE — ED Triage Notes (Signed)
Pt has arrived for his next rabies vaccine; pt reports this maybe the last

## 2019-05-30 NOTE — Discharge Instructions (Addendum)
This is a final injection of the rabies series.

## 2019-06-13 DIAGNOSIS — K219 Gastro-esophageal reflux disease without esophagitis: Secondary | ICD-10-CM | POA: Diagnosis not present

## 2019-06-13 DIAGNOSIS — I10 Essential (primary) hypertension: Secondary | ICD-10-CM | POA: Diagnosis not present

## 2019-06-13 DIAGNOSIS — F418 Other specified anxiety disorders: Secondary | ICD-10-CM | POA: Diagnosis not present

## 2019-06-13 DIAGNOSIS — G441 Vascular headache, not elsewhere classified: Secondary | ICD-10-CM | POA: Diagnosis not present

## 2019-06-24 DIAGNOSIS — R4189 Other symptoms and signs involving cognitive functions and awareness: Secondary | ICD-10-CM | POA: Diagnosis not present

## 2019-06-24 DIAGNOSIS — R519 Headache, unspecified: Secondary | ICD-10-CM | POA: Diagnosis not present

## 2019-06-27 DIAGNOSIS — Z03818 Encounter for observation for suspected exposure to other biological agents ruled out: Secondary | ICD-10-CM | POA: Diagnosis not present

## 2019-08-12 ENCOUNTER — Encounter: Payer: BC Managed Care – PPO | Admitting: Cardiovascular Disease

## 2019-08-12 ENCOUNTER — Encounter: Payer: Self-pay | Admitting: Cardiovascular Disease

## 2019-08-12 ENCOUNTER — Other Ambulatory Visit: Payer: Self-pay

## 2019-08-12 NOTE — Progress Notes (Signed)
The visit was rescheduled due to a provider's emergency.  This encounter was created in error - please disregard.

## 2019-08-16 DIAGNOSIS — Z03818 Encounter for observation for suspected exposure to other biological agents ruled out: Secondary | ICD-10-CM | POA: Diagnosis not present

## 2019-09-09 ENCOUNTER — Ambulatory Visit: Payer: BC Managed Care – PPO | Admitting: Cardiovascular Disease

## 2019-10-13 ENCOUNTER — Encounter: Payer: Self-pay | Admitting: Cardiovascular Disease

## 2019-10-13 ENCOUNTER — Other Ambulatory Visit: Payer: Self-pay

## 2019-10-13 ENCOUNTER — Ambulatory Visit (INDEPENDENT_AMBULATORY_CARE_PROVIDER_SITE_OTHER): Payer: BC Managed Care – PPO | Admitting: Cardiovascular Disease

## 2019-10-13 VITALS — BP 140/80 | HR 59 | Ht 72.0 in | Wt 247.2 lb

## 2019-10-13 DIAGNOSIS — I2510 Atherosclerotic heart disease of native coronary artery without angina pectoris: Secondary | ICD-10-CM | POA: Diagnosis not present

## 2019-10-13 DIAGNOSIS — I10 Essential (primary) hypertension: Secondary | ICD-10-CM | POA: Diagnosis not present

## 2019-10-13 DIAGNOSIS — E785 Hyperlipidemia, unspecified: Secondary | ICD-10-CM

## 2019-10-13 NOTE — Patient Instructions (Signed)
Medication Instructions:  Your physician recommends that you continue on your current medications as directed. Please refer to the Current Medication list given to you today.  *If you need a refill on your cardiac medications before your next appointment, please call your pharmacy*  Lab Work: None ordered If you have labs (blood work) drawn today and your tests are completely normal, you will receive your results only by: Marland Kitchen MyChart Message (if you have MyChart) OR . A paper copy in the mail If you have any lab test that is abnormal or we need to change your treatment, we will call you to review the results.  Testing/Procedures: None ordered  Follow-Up: At [facility], you and your health needs are our priority.  As part of our continuing mission to provide you with exceptional heart care, we have created designated Provider Care Teams.  These Care Teams include your primary Cardiologist (physician) and Advanced Practice Providers (APPs -  Physician Assistants and Nurse Practitioners) who all work together to provide you with the care you need, when you need it.  Your next appointment:   6 month(s)  The format for your next appointment:   In Person  Provider:    You may see Dr. Fletcher Anon or one of the following Advanced Practice Providers on your designated Care Team:    Murray Hodgkins, NP  Christell Faith, PA-C  Marrianne Mood, PA-C   Other Instructions N/A

## 2019-10-13 NOTE — Progress Notes (Signed)
Cardiology Office Note   Date:  10/13/2019   ID:  Carlos Diaz, DOB [birth year], MRN [id number]  PCP:  Valera Castle, MD  Cardiologist:   Kathlyn Sacramento, MD   Chief Complaint  Patient presents with  . other    3 month follow up. Meds reviewed by the pt. verbally. "doing well."       History of Present Illness: Carlos Diaz is a 59 y.o. male who presents for a follow-up visit regarding coronary artery disease. He has known history of coronary artery disease and presented with non-ST elevation myocardial infarction in 2017.  He was found to have high-grade stenosis in the ramus intermedius which was treated with PCI and drug-eluting stent placement.  He had recurrent anginal symptoms in April 2020.  Repeat cardiac catheterization showed patent ramus stent.  There was high-grade stenosis in proximal OM branch which was treated successfully with PCI and drug-eluting stent placement. There was also moderate proximal to mid LAD disease and mild RCA disease.  Other medical problems include essential hypertension and hyperlipidemia.  He is not a smoker and has no family history of coronary artery disease.  He works at [employer]. He has been doing well with no exertional chest pain or shortness of breath. He continues to complain of intermittent left-sided discomfort close to his left shoulder radiating to the back. The pain is described as aching that gets worse with touch. No shortness of breath.    Past Medical History:  Diagnosis Date  . Anxiety   . Hyperlipidemia   . Hypertension   . Lyme disease   . Myocardial infarction ([place]) 04/13/2016    Past Surgical History:  Procedure Laterality Date  . CARDIAC CATHETERIZATION Right 04/14/2016   Procedure: Left Heart Cath and Coronary Angiography;  Surgeon: Dionisio Gayle, MD;  Location: [place] [facility];  Service: Cardiovascular;  Laterality: Right;  . CARDIAC CATHETERIZATION N/A 04/14/2016   Procedure: Coronary Stent  Intervention;  Surgeon: Yolonda Kida, MD;  Location: [place] [facility];  Service: Cardiovascular;  Laterality: N/A;  . CORONARY STENT INTERVENTION N/A 12/23/2018   Procedure: CORONARY STENT INTERVENTION;  Surgeon: Isaias Cowman, MD;  Location: [place] [facility];  Service: Cardiovascular;  Laterality: N/A;  . LEFT HEART CATH AND CORONARY ANGIOGRAPHY Left 12/23/2018   Procedure: LEFT HEART CATH AND CORONARY ANGIOGRAPHY;  Surgeon: Dionisio Tristain, MD;  Location: [name] [facility];  Service: Cardiovascular;  Laterality: Left;     Current Outpatient Medications  Medication Sig Dispense Refill  . aspirin 81 MG chewable tablet Chew 1 tablet (81 mg total) by mouth daily. 30 tablet 1  . buPROPion (WELLBUTRIN XL) 150 MG 24 hr tablet Take 150 mg by mouth daily.    . citalopram (CELEXA) 20 MG tablet Take 40 mg by mouth daily.     . clopidogrel (PLAVIX) 75 MG tablet Take 75 mg by mouth daily.    . isosorbide mononitrate (IMDUR) 30 MG 24 hr tablet Take 1 tablet (30 mg total) by mouth daily. 30 tablet 0  . metoprolol succinate (TOPROL-XL) 25 MG 24 hr tablet Take 25 mg by mouth daily.    . nitroGLYCERIN (NITROSTAT) 0.4 MG SL tablet Place 0.4 mg under the tongue every 5 (five) minutes as needed.     . pantoprazole (PROTONIX) 20 MG tablet Take 20 mg by mouth 2 (two) times daily.    . rosuvastatin (CRESTOR) 40 MG tablet Take 40 mg by mouth daily.  No current facility-administered medications for this visit.    Allergies:   Patient has no known allergies.    Social History:  The patient  reports that he has never smoked. He has never used smokeless tobacco. He reports that he does not drink alcohol or use drugs.   Family History:  The patient's family history includes CAD in his mother.    ROS:  Please see the history of present illness.   Otherwise, review of systems are positive for none.   All other systems are reviewed and negative.    PHYSICAL EXAM: VS:  BP 140/80 (BP  Location: Left Arm, Patient Position: Sitting, Cuff Size: Normal)   Pulse (!) 59   Ht 6' (1.829 m)   Wt 247 lb 4 oz (112.2 kg)   BMI 33.53 kg/m  , BMI Body mass index is 33.53 kg/m. GEN: Well nourished, well developed, in no acute distress  HEENT: normal  Neck: no JVD, carotid bruits, or masses Cardiac: RRR; no murmurs, rubs, or gallops,no edema  Respiratory:  clear to auscultation bilaterally, normal work of breathing GI: soft, nontender, nondistended, + BS MS: no deformity or atrophy  Skin: warm and dry, no rash Neuro:  Strength and sensation are intact Psych: euthymic mood, full affect   EKG:  EKG is ordered today. The ekg ordered today demonstrates sinus bradycardia with no significant ST or T wave changes.   Recent Labs: 12/24/2018: BUN 15; Creatinine, Ser 0.82; Hemoglobin 12.0; Platelets 166; Potassium 4.0; Sodium 139    Lipid Panel    Component Value Date/Time   CHOL 192 04/14/2016 0623   TRIG 154 (H) 04/14/2016 0623   HDL 35 (L) 04/14/2016 0623   CHOLHDL 5.5 04/14/2016 0623   VLDL 31 04/14/2016 0623   LDLCALC 126 (H) 04/14/2016 0623      Wt Readings from Last 3 Encounters:  10/13/19 247 lb 4 oz (112.2 kg)  08/12/19 243 lb 12 oz (110.6 kg)  05/30/19 235 lb (106.6 kg)      No flowsheet data found.    ASSESSMENT AND PLAN:  1. Coronary artery disease involving native coronary arteries without angina: He is doing well overall. He has chronic left-sided chest pain that seems to be musculoskeletal in nature. He will notify me if he develops any exertional symptoms. In the meanwhile, continue medical therapy.  The patient does have residual disease in the LAD and RCA which was not obstructive but requires aggressive treatment.  2.  Hyperlipidemia: Continue high-dose rosuvastatin with a target LDL of less than 70. I reviewed his lipid profile in October which showed a total cholesterol of 110, LDL of 40, HDL of 31 and triglyceride of 196.  3.  Essential  hypertension: Blood pressure is reasonably controlled. If bradycardia worsens, will switch metoprolol to carvedilol    Disposition:   FU with me in 6 months  Signed,  Kathlyn Sacramento, MD  10/13/2019 3:05 PM    [place]

## 2019-12-12 DIAGNOSIS — M542 Cervicalgia: Secondary | ICD-10-CM | POA: Diagnosis not present

## 2019-12-12 DIAGNOSIS — Z125 Encounter for screening for malignant neoplasm of prostate: Secondary | ICD-10-CM | POA: Diagnosis not present

## 2019-12-12 DIAGNOSIS — Z1211 Encounter for screening for malignant neoplasm of colon: Secondary | ICD-10-CM | POA: Diagnosis not present

## 2019-12-12 DIAGNOSIS — R519 Headache, unspecified: Secondary | ICD-10-CM | POA: Diagnosis not present

## 2019-12-16 ENCOUNTER — Other Ambulatory Visit: Payer: Self-pay | Admitting: Neurology

## 2019-12-16 DIAGNOSIS — A6920 Lyme disease, unspecified: Secondary | ICD-10-CM | POA: Diagnosis not present

## 2019-12-16 DIAGNOSIS — F488 Other specified nonpsychotic mental disorders: Secondary | ICD-10-CM

## 2019-12-16 DIAGNOSIS — R410 Disorientation, unspecified: Secondary | ICD-10-CM | POA: Diagnosis not present

## 2019-12-16 DIAGNOSIS — R4182 Altered mental status, unspecified: Secondary | ICD-10-CM | POA: Diagnosis not present

## 2019-12-16 DIAGNOSIS — R4189 Other symptoms and signs involving cognitive functions and awareness: Secondary | ICD-10-CM

## 2019-12-26 DIAGNOSIS — F488 Other specified nonpsychotic mental disorders: Secondary | ICD-10-CM | POA: Diagnosis not present

## 2019-12-26 DIAGNOSIS — A6920 Lyme disease, unspecified: Secondary | ICD-10-CM | POA: Diagnosis not present

## 2019-12-26 DIAGNOSIS — R5382 Chronic fatigue, unspecified: Secondary | ICD-10-CM | POA: Diagnosis not present

## 2019-12-26 DIAGNOSIS — A6922 Other neurologic disorders in Lyme disease: Secondary | ICD-10-CM | POA: Diagnosis not present

## 2019-12-26 DIAGNOSIS — R4182 Altered mental status, unspecified: Secondary | ICD-10-CM | POA: Diagnosis not present

## 2019-12-26 DIAGNOSIS — R410 Disorientation, unspecified: Secondary | ICD-10-CM | POA: Diagnosis not present

## 2019-12-28 DIAGNOSIS — L570 Actinic keratosis: Secondary | ICD-10-CM | POA: Diagnosis not present

## 2019-12-28 DIAGNOSIS — L814 Other melanin hyperpigmentation: Secondary | ICD-10-CM | POA: Diagnosis not present

## 2019-12-30 ENCOUNTER — Other Ambulatory Visit: Payer: Self-pay

## 2019-12-30 ENCOUNTER — Ambulatory Visit
Admission: RE | Admit: 2019-12-30 | Discharge: 2019-12-30 | Disposition: A | Discharge status: Home / Self Care | Payer: BC Managed Care – PPO | Source: Ambulatory Visit | Attending: Neurology | Admitting: Neurology

## 2019-12-30 DIAGNOSIS — F488 Other specified nonpsychotic mental disorders: Secondary | ICD-10-CM | POA: Insufficient documentation

## 2019-12-30 DIAGNOSIS — R4189 Other symptoms and signs involving cognitive functions and awareness: Secondary | ICD-10-CM | POA: Diagnosis not present

## 2019-12-30 MED ORDER — GADOBUTROL 1 MMOL/ML IV SOLN
10.0000 mL | Freq: Once | INTRAVENOUS | Status: AC | PRN
  Administered 2019-12-30: 10 mL via INTRAVENOUS

## 2020-02-16 DIAGNOSIS — R569 Unspecified convulsions: Secondary | ICD-10-CM | POA: Diagnosis not present

## 2020-03-05 DIAGNOSIS — L578 Other skin changes due to chronic exposure to nonionizing radiation: Secondary | ICD-10-CM | POA: Diagnosis not present

## 2020-03-05 DIAGNOSIS — Z872 Personal history of diseases of the skin and subcutaneous tissue: Secondary | ICD-10-CM | POA: Diagnosis not present

## 2020-03-05 DIAGNOSIS — L821 Other seborrheic keratosis: Secondary | ICD-10-CM | POA: Diagnosis not present

## 2020-03-05 DIAGNOSIS — Z86018 Personal history of other benign neoplasm: Secondary | ICD-10-CM | POA: Diagnosis not present

## 2020-03-05 DIAGNOSIS — L570 Actinic keratosis: Secondary | ICD-10-CM | POA: Diagnosis not present

## 2020-03-07 DIAGNOSIS — K219 Gastro-esophageal reflux disease without esophagitis: Secondary | ICD-10-CM | POA: Diagnosis not present

## 2020-03-07 DIAGNOSIS — Z1211 Encounter for screening for malignant neoplasm of colon: Secondary | ICD-10-CM | POA: Diagnosis not present

## 2020-03-07 DIAGNOSIS — Z7901 Long term (current) use of anticoagulants: Secondary | ICD-10-CM | POA: Diagnosis not present

## 2020-03-08 ENCOUNTER — Telehealth: Payer: Self-pay | Admitting: Cardiovascular Disease

## 2020-03-08 NOTE — Telephone Encounter (Signed)
   [facility] Pre-operative Risk Assessment    HEARTCARE STAFF: - Please ensure there is not already an duplicate clearance open for this procedure. - Under Visit Info/Reason for Call, type in Other and utilize the format Clearance MM/DD/YY or Clearance TBD. Do not use dashes or single digits. - If request is for dental extraction, please clarify the # of teeth to be extracted.  Request for surgical clearance:  1. What type of surgery is being performed? colonscopy  2. When is this surgery scheduled? 03/26/20  3. What type of clearance is required (medical clearance vs. Pharmacy clearance to hold med vs. Both)? both  4. Are there any medications that need to be held prior to surgery and how long?advise o Plavix  5. Practice name and name of physician performing surgery? [facility] [name]  6. What is the office phone number? [phone]   7.   What is the office fax number? [phone]  8.   Anesthesia type (None, local, MAC, general) ? general   Carlos Diaz 03/08/2020, 12:07 PM  _________________________________________________________________   (provider comments below)

## 2020-03-08 NOTE — Telephone Encounter (Signed)
Dr. Fletcher Anon, please review. Patient has h/o CAD. Last PCI was in Apr 2020, at which time, he underwent DES to [place], he has 40% prox LAD and 40% mid RCA residual. He was last seen in the clinic in Feb 2021 at which time he was doing well.   Talking with the patient today, he continues to do well without any exertional chest pain or shortness of breath. He is cleared from cardiac perspective based on activity level and lack of symptom. Colonoscopy is a very low risk procedure.   Since he is 1 year out from last PCI, Dr. Fletcher Anon, please comment on if it is ok with you to hold plavix for 5 days prior to the procedure.   Please forward your response to [facility] PREOP  Thank you

## 2020-03-09 NOTE — Telephone Encounter (Signed)
   Primary Cardiologist: No primary care provider on file.  Chart reviewed as part of pre-operative protocol coverage. Patient was contacted 03/09/2020 in reference to pre-operative risk assessment for pending surgery as outlined below.  Daved Mcfann was last seen on 10/13/2019 by Dr. Fletcher Anon.  Since that day, Kendell Sagraves has done well without chest pain or shortness of breath.  Therefore, based on ACC/AHA guidelines, the patient would be at acceptable risk for the planned procedure without further cardiovascular testing.   I will route this recommendation to the requesting party via Epic fax function and remove from pre-op pool. Please call with questions.  Per Dr. Fletcher Anon, he may hold plavix for 5 days prior to the surgery and then restart as soon as possible after the procedure at the surgeon's discretion.   [place], [place] 03/09/2020, 6:43 PM

## 2020-03-09 NOTE — Telephone Encounter (Signed)
Can hold Plavix 5 days before procedures.

## 2020-03-21 DIAGNOSIS — A770 Spotted fever due to Rickettsia rickettsii: Secondary | ICD-10-CM | POA: Diagnosis not present

## 2020-03-22 ENCOUNTER — Other Ambulatory Visit
Admission: RE | Admit: 2020-03-22 | Discharge: 2020-03-22 | Disposition: A | Discharge status: Home / Self Care | Payer: BC Managed Care – PPO | Source: Ambulatory Visit | Attending: Gastroenterology | Admitting: Gastroenterology

## 2020-03-22 ENCOUNTER — Other Ambulatory Visit: Payer: Self-pay

## 2020-03-22 DIAGNOSIS — Z20822 Contact with and (suspected) exposure to covid-19: Secondary | ICD-10-CM | POA: Insufficient documentation

## 2020-03-22 DIAGNOSIS — Z01812 Encounter for preprocedural laboratory examination: Secondary | ICD-10-CM | POA: Insufficient documentation

## 2020-03-22 LAB — SARS CORONAVIRUS 2 (TAT 6-24 HRS): SARS Coronavirus 2: NEGATIVE

## 2020-03-23 ENCOUNTER — Encounter: Payer: Self-pay | Admitting: *Deleted

## 2020-03-26 ENCOUNTER — Ambulatory Visit
Admission: RE | Admit: 2020-03-26 | Discharge: 2020-03-26 | Disposition: A | Discharge status: Home / Self Care | Payer: BC Managed Care – PPO | Attending: Gastroenterology | Admitting: Gastroenterology

## 2020-03-26 ENCOUNTER — Ambulatory Visit: Payer: BC Managed Care – PPO | Admitting: Anesthesiology

## 2020-03-26 ENCOUNTER — Encounter: Payer: Self-pay | Admitting: Gastroenterology

## 2020-03-26 ENCOUNTER — Other Ambulatory Visit: Payer: Self-pay

## 2020-03-26 ENCOUNTER — Encounter
Admission: RE | Disposition: A | Discharge status: Home / Self Care | Payer: Self-pay | Source: Home / Self Care | Attending: Gastroenterology

## 2020-03-26 DIAGNOSIS — D125 Benign neoplasm of sigmoid colon: Secondary | ICD-10-CM | POA: Insufficient documentation

## 2020-03-26 DIAGNOSIS — Z1211 Encounter for screening for malignant neoplasm of colon: Secondary | ICD-10-CM | POA: Diagnosis not present

## 2020-03-26 DIAGNOSIS — E785 Hyperlipidemia, unspecified: Secondary | ICD-10-CM | POA: Insufficient documentation

## 2020-03-26 DIAGNOSIS — F419 Anxiety disorder, unspecified: Secondary | ICD-10-CM | POA: Diagnosis not present

## 2020-03-26 DIAGNOSIS — I10 Essential (primary) hypertension: Secondary | ICD-10-CM | POA: Insufficient documentation

## 2020-03-26 DIAGNOSIS — F329 Major depressive disorder, single episode, unspecified: Secondary | ICD-10-CM | POA: Insufficient documentation

## 2020-03-26 DIAGNOSIS — K219 Gastro-esophageal reflux disease without esophagitis: Secondary | ICD-10-CM | POA: Insufficient documentation

## 2020-03-26 DIAGNOSIS — K648 Other hemorrhoids: Secondary | ICD-10-CM | POA: Insufficient documentation

## 2020-03-26 DIAGNOSIS — Z7902 Long term (current) use of antithrombotics/antiplatelets: Secondary | ICD-10-CM | POA: Insufficient documentation

## 2020-03-26 DIAGNOSIS — Z955 Presence of coronary angioplasty implant and graft: Secondary | ICD-10-CM | POA: Insufficient documentation

## 2020-03-26 DIAGNOSIS — I252 Old myocardial infarction: Secondary | ICD-10-CM | POA: Insufficient documentation

## 2020-03-26 DIAGNOSIS — K635 Polyp of colon: Secondary | ICD-10-CM | POA: Diagnosis not present

## 2020-03-26 DIAGNOSIS — Z7982 Long term (current) use of aspirin: Secondary | ICD-10-CM | POA: Diagnosis not present

## 2020-03-26 DIAGNOSIS — A6920 Lyme disease, unspecified: Secondary | ICD-10-CM | POA: Diagnosis not present

## 2020-03-26 DIAGNOSIS — Z79899 Other long term (current) drug therapy: Secondary | ICD-10-CM | POA: Diagnosis not present

## 2020-03-26 DIAGNOSIS — K640 First degree hemorrhoids: Secondary | ICD-10-CM | POA: Diagnosis not present

## 2020-03-26 HISTORY — PX: COLONOSCOPY WITH PROPOFOL: SHX5780

## 2020-03-26 HISTORY — DX: Depression, unspecified: F32.A

## 2020-03-26 HISTORY — DX: Gastro-esophageal reflux disease without esophagitis: K21.9

## 2020-03-26 SURGERY — COLONOSCOPY WITH PROPOFOL
Anesthesia: General

## 2020-03-26 MED ORDER — PROPOFOL 500 MG/50ML IV EMUL
INTRAVENOUS | Status: AC
  Filled 2020-03-26: qty 50

## 2020-03-26 MED ORDER — PROPOFOL 10 MG/ML IV BOLUS
INTRAVENOUS | Status: AC
  Filled 2020-03-26: qty 20

## 2020-03-26 MED ORDER — PROPOFOL 10 MG/ML IV BOLUS
INTRAVENOUS | Status: DC | PRN
  Administered 2020-03-26: 40 mg via INTRAVENOUS
  Administered 2020-03-26: 60 mg via INTRAVENOUS

## 2020-03-26 MED ORDER — SODIUM CHLORIDE 0.9 % IV SOLN: INTRAVENOUS | Status: DC | PRN

## 2020-03-26 MED ORDER — PROPOFOL 500 MG/50ML IV EMUL
INTRAVENOUS | Status: DC | PRN
  Administered 2020-03-26: 200 ug/kg/min via INTRAVENOUS

## 2020-03-26 MED ORDER — LIDOCAINE HCL (PF) 1 % IJ SOLN
INTRAMUSCULAR | Status: AC
  Filled 2020-03-26: qty 2

## 2020-03-26 NOTE — Op Note (Addendum)
[facility] Patient Name: Carlos Diaz Procedure Date: 03/26/2020 11:57 AM MRN: [id number] Account #: [id number] Date of Birth: 05/15/1961 Admit Type: Outpatient Age: 59 Room: [facility] ENDO ROOM 4 Gender: Male Note Status: Finalized Procedure:             Colonoscopy Indications:           Screening for colorectal malignant neoplasm Providers:             Andrey Farmer MD, MD Referring MD:          Wynona Canes. Kym Groom, MD (Referring MD) Medicines:             Monitored Anesthesia Care Complications:         No immediate complications. Estimated blood loss:                         Minimal. Procedure:             Pre-Anesthesia Assessment:                        - Prior to the procedure, a History and Physical was                         performed, and patient medications and allergies were                         reviewed. The patient is competent. The risks and                         benefits of the procedure and the sedation options and                         risks were discussed with the patient. All questions                         were answered and informed consent was obtained.                         Patient identification and proposed procedure were                         verified by the physician, the nurse, the anesthetist                         and the technician in the endoscopy suite. Mental                         Status Examination: alert and oriented. Airway                         Examination: normal oropharyngeal airway and neck                         mobility. Respiratory Examination: clear to                         auscultation. CV Examination: normal. Prophylactic  Antibiotics: The patient does not require prophylactic                         antibiotics. Prior Anticoagulants: The patient has                         taken no previous anticoagulant or antiplatelet                         agents. ASA  Grade Assessment: II - A patient with mild                         systemic disease. After reviewing the risks and                         benefits, the patient was deemed in satisfactory                         condition to undergo the procedure. The anesthesia                         plan was to use monitored anesthesia care (MAC).                         Immediately prior to administration of medications,                         the patient was re-assessed for adequacy to receive                         sedatives. The heart rate, respiratory rate, oxygen                         saturations, blood pressure, adequacy of pulmonary                         ventilation, and response to care were monitored                         throughout the procedure. The physical status of the                         patient was re-assessed after the procedure.                        After obtaining informed consent, the colonoscope was                         passed under direct vision. Throughout the procedure,                         the patient's blood pressure, pulse, and oxygen                         saturations were monitored continuously. The                         Colonoscope was introduced through the anus and  advanced to the the cecum, identified by appendiceal                         orifice and ileocecal valve. The colonoscopy was                         performed without difficulty. The patient tolerated                         the procedure well. The quality of the bowel                         preparation was good. Findings:      The perianal and digital rectal examinations were normal.      A 2 mm polyp was found in the sigmoid colon. The polyp was sessile. The       polyp was removed with a jumbo cold forceps. Resection and retrieval       were complete. Estimated blood loss was minimal.      Internal hemorrhoids were found during retroflexion. The hemorrhoids        were small.      The exam was otherwise without abnormality on direct and retroflexion       views. Impression:            - One 2 mm polyp in the sigmoid colon, removed with a                         jumbo cold forceps. Resected and retrieved.                        - Internal hemorrhoids.                        - The examination was otherwise normal on direct and                         retroflexion views. Recommendation:        - Discharge patient to home.                        - Resume previous diet.                        - Continue present medications.                        - Await pathology results.                        - Repeat colonoscopy in 10 years for surveillance.                        - Return to referring physician as previously                         scheduled.                        - Resume Plavix (clopidogrel) at prior dose tomorrow. Procedure Code(s):     --- Professional ---  45380, Colonoscopy, flexible; with biopsy, single or                         multiple Diagnosis Code(s):     --- Professional ---                        Z12.11, Encounter for screening for malignant neoplasm                         of colon                        K63.5, Polyp of colon                        K64.8, Other hemorrhoids CPT copyright 2019 American Medical Association. All rights reserved. The codes documented in this report are preliminary and upon coder review may  be revised to meet current compliance requirements. Andrey Farmer, MD Andrey Farmer MD, MD 03/26/2020 1:10:25 PM Number of Addenda: 0 Note Initiated On: 03/26/2020 11:57 AM Scope Withdrawal Time: 0 hours 11 minutes 45 seconds  Total Procedure Duration: 0 hours 18 minutes 13 seconds  Estimated Blood Loss:  Estimated blood loss was minimal.      [facility]

## 2020-03-26 NOTE — Anesthesia Preprocedure Evaluation (Addendum)
Anesthesia Evaluation  Patient identified by MRN, date of birth, ID band Patient awake    Reviewed: Allergy & Precautions, H&P , NPO status , Patient's Chart, lab work & pertinent test results  Airway Mallampati: II  TM Distance: >3 FB     Dental   Pulmonary neg pulmonary ROS, neg COPD,    breath sounds clear to auscultation       Cardiovascular hypertension, (-) angina+ CAD, + Past MI and + Cardiac Stents (April 2020)   Rhythm:regular Rate:Normal     Neuro/Psych PSYCHIATRIC DISORDERS Anxiety Depression negative neurological ROS     GI/Hepatic Neg liver ROS, GERD  ,  Endo/Other  negative endocrine ROS  Renal/GU negative Renal ROS  negative genitourinary   Musculoskeletal   Abdominal   Peds  Hematology negative hematology ROS (+)   Anesthesia Other Findings Past Medical History: No date: Anxiety No date: Depression No date: GERD (gastroesophageal reflux disease) No date: Hyperlipidemia No date: Hypertension No date: Lyme disease 04/13/2016: Myocardial infarction [facility]  Past Surgical History: 04/14/2016: CARDIAC CATHETERIZATION; Right     Comment:  Procedure: Left Heart Cath and Coronary Angiography;                Surgeon: Dionisio Whitman, MD;  Location: [place] [facility]               [facility];  Service: Cardiovascular;  Laterality: Right; 04/14/2016: CARDIAC CATHETERIZATION; N/A     Comment:  Procedure: Coronary Stent Intervention;  Surgeon: Yolonda Kida, MD;  Location: [place] [facility];                Service: Cardiovascular;  Laterality: N/A; No date: COLONOSCOPY No date: CORONARY ANGIOPLASTY 12/23/2018: CORONARY STENT INTERVENTION; N/A     Comment:  Procedure: CORONARY STENT INTERVENTION;  Surgeon:               Isaias Cowman, MD;  Location: [place] [facility]               [facility];  Service: Cardiovascular;  Laterality: N/A; 12/23/2018: LEFT HEART CATH AND CORONARY ANGIOGRAPHY; Left      Comment:  Procedure: LEFT HEART CATH AND CORONARY ANGIOGRAPHY;                Surgeon: Dionisio Taelor, MD;  Location: [place] [facility]              [facility];  Service: Cardiovascular;  Laterality: Left;     Reproductive/Obstetrics negative OB ROS                            Anesthesia Physical Anesthesia Plan  ASA: III  Anesthesia Plan: General   Post-op Pain Management:    Induction:   PONV Risk Score and Plan: Propofol infusion and TIVA  Airway Management Planned: Nasal Cannula  Additional Equipment:   Intra-op Plan:   Post-operative Plan:   Informed Consent: I have reviewed the patients History and Physical, chart, labs and discussed the procedure including the risks, benefits and alternatives for the proposed anesthesia with the patient or authorized representative who has indicated his/her understanding and acceptance.     Dental Advisory Given  Plan Discussed with: Anesthesiologist, CRNA and Surgeon  Anesthesia Plan Comments:         Anesthesia Quick Evaluation

## 2020-03-26 NOTE — Transfer of Care (Signed)
Immediate Anesthesia Transfer of Care Note  Patient: Carlos Diaz  Procedure(s) Performed: COLONOSCOPY WITH PROPOFOL (N/A )  Patient Location: PACU and Endoscopy Unit  Anesthesia Type:General  Level of Consciousness: sedated  Airway & Oxygen Therapy: Patient Spontanous Breathing  Post-op Assessment: Report given to RN and Post -op Vital signs reviewed and stable  Post vital signs: Reviewed and stable  Last Vitals:  Vitals Value Taken Time  BP 119/104 03/26/20 1309  Temp    Pulse 62 03/26/20 1310  Resp 13 03/26/20 1310  SpO2 93 % 03/26/20 1310  Vitals shown include unvalidated device data.  Last Pain:  Vitals:   03/26/20 1146  TempSrc: (P) Temporal         Complications: No complications documented.

## 2020-03-26 NOTE — H&P (Signed)
Outpatient short stay form Pre-procedure 03/26/2020 12:03 PM Raylene Miyamoto MD, MPH  Primary Physician: Dr. Kym Groom  Reason for visit:  Screening  History of present illness:  59 y/o gentleman here for screening colonoscopy. No family history of GI malignancies. Last took plavix six days ago. No abdominal surgeries.    Current Facility-Administered Medications:  .  lidocaine (PF) (XYLOCAINE) 1 % injection, , , ,   Medications Prior to Admission  Medication Sig Dispense Refill Last Dose  . aspirin 81 MG chewable tablet Chew 1 tablet (81 mg total) by mouth daily. 30 tablet 1 03/24/2020  . buPROPion (WELLBUTRIN XL) 150 MG 24 hr tablet Take 150 mg by mouth daily.     . citalopram (CELEXA) 20 MG tablet Take 40 mg by mouth daily.      . clopidogrel (PLAVIX) 75 MG tablet Take 75 mg by mouth daily.   03/20/2020  . isosorbide mononitrate (IMDUR) 30 MG 24 hr tablet Take 1 tablet (30 mg total) by mouth daily. 30 tablet 0   . metoprolol succinate (TOPROL-XL) 25 MG 24 hr tablet Take 25 mg by mouth daily.   03/26/2020 at 0800  . nitroGLYCERIN (NITROSTAT) 0.4 MG SL tablet Place 0.4 mg under the tongue every 5 (five) minutes as needed.      . pantoprazole (PROTONIX) 20 MG tablet Take 20 mg by mouth 2 (two) times daily.     . rosuvastatin (CRESTOR) 40 MG tablet Take 40 mg by mouth daily.        No Known Allergies   Past Medical History:  Diagnosis Date  . Anxiety   . Depression   . GERD (gastroesophageal reflux disease)   . Hyperlipidemia   . Hypertension   . Lyme disease   . Myocardial infarction ([place]) 04/13/2016    Review of systems:  Otherwise negative.    Physical Exam  Gen: Alert, oriented. Appears stated age.  HEENT: [place]/AT. PERRLA. Lungs: no respiratory distress Abd: soft, benign, no masses Ext: No edema. Pulses 2+    Planned procedures: Proceed with colonoscopy. The patient understands the nature of the planned procedure, indications, risks, alternatives and potential  complications including but not limited to bleeding, infection, perforation, damage to internal organs and possible oversedation/side effects from anesthesia. The patient agrees and gives consent to proceed.  Please refer to procedure notes for findings, recommendations and patient disposition/instructions.     Raylene Miyamoto MD, MPH Gastroenterology 03/26/2020  12:03 PM

## 2020-03-27 LAB — SURGICAL PATHOLOGY

## 2020-03-27 NOTE — Anesthesia Postprocedure Evaluation (Signed)
Anesthesia Post Note  Patient: Carlos Diaz  Procedure(s) Performed: COLONOSCOPY WITH PROPOFOL (N/A )  Patient location during evaluation: PACU Anesthesia Type: General Level of consciousness: awake and alert Pain management: pain level controlled Vital Signs Assessment: post-procedure vital signs reviewed and stable Respiratory status: spontaneous breathing, nonlabored ventilation and respiratory function stable Cardiovascular status: blood pressure returned to baseline and stable Postop Assessment: no apparent nausea or vomiting Anesthetic complications: no   No complications documented.   Last Vitals:  Vitals:   03/26/20 1319 03/26/20 1329  BP: 100/70 (!) 113/89  Pulse: 59 62  Resp: 13 (!) 11  Temp:    SpO2: 95% 97%    Last Pain:  Vitals:   03/26/20 1329  TempSrc:   PainSc: 0-No pain                 Brett Canales Makenzee Choudhry

## 2020-03-28 IMAGING — CR DG FOREARM 2V*R*
1 series · 2 of 2 positions shown · non-contrast
Comparison: None.

CLINICAL DATA: Hit with baseball

EXAM:
RIGHT FOREARM - 2 VIEW

[Series 1: x forearm ap right · 0.14mm/px · 2 of 2 slices shown]
[im 1/2]
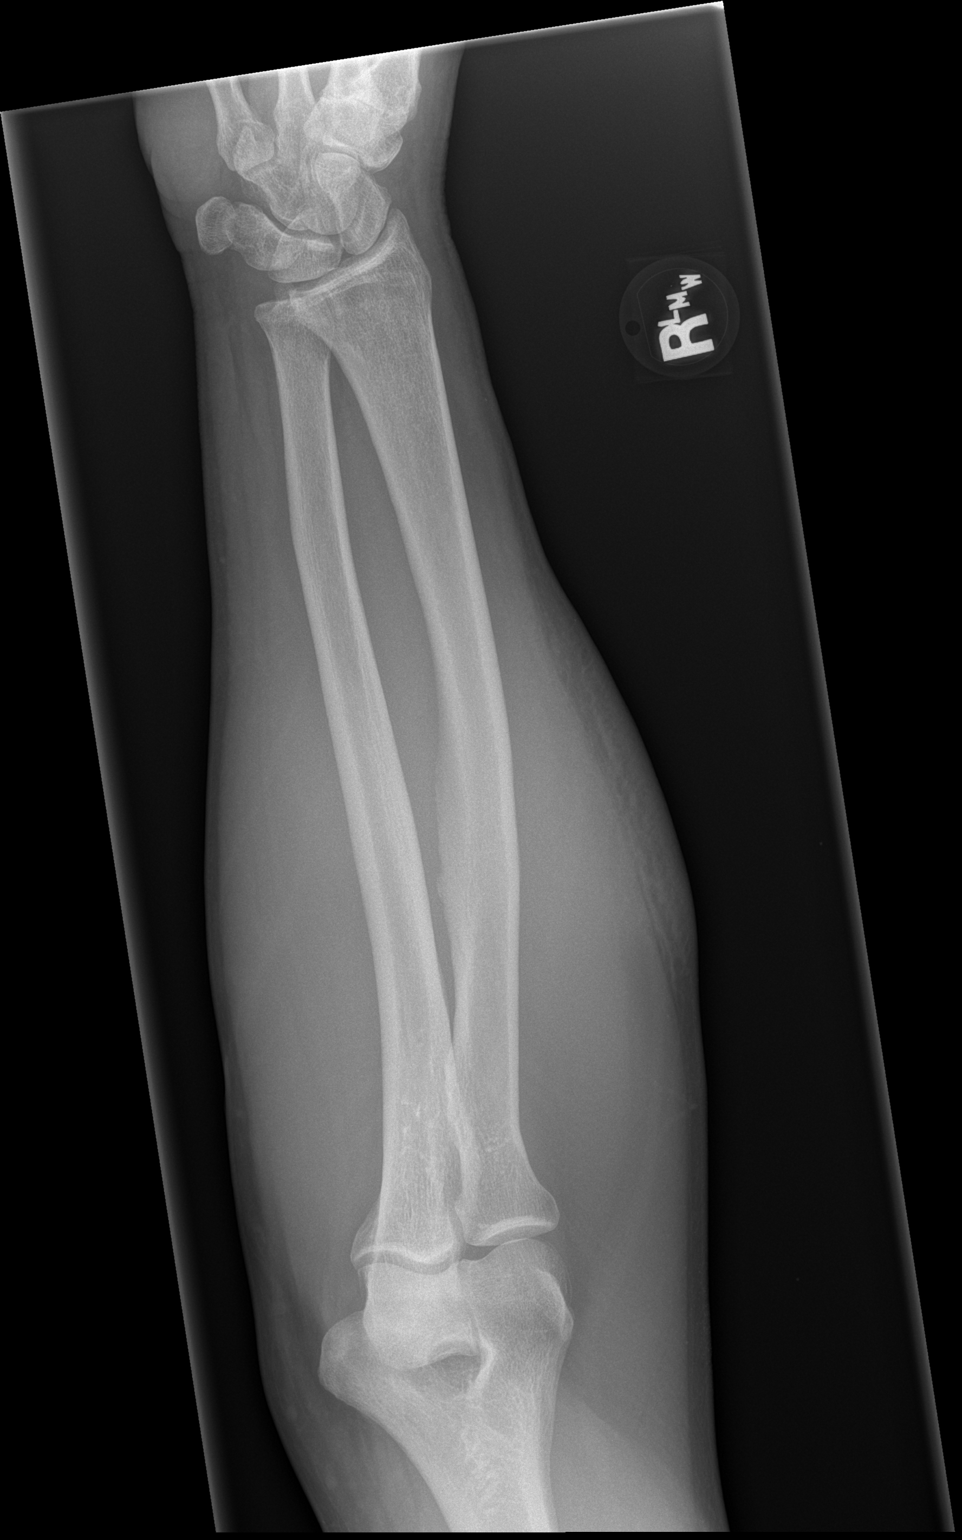
[im 2/2]
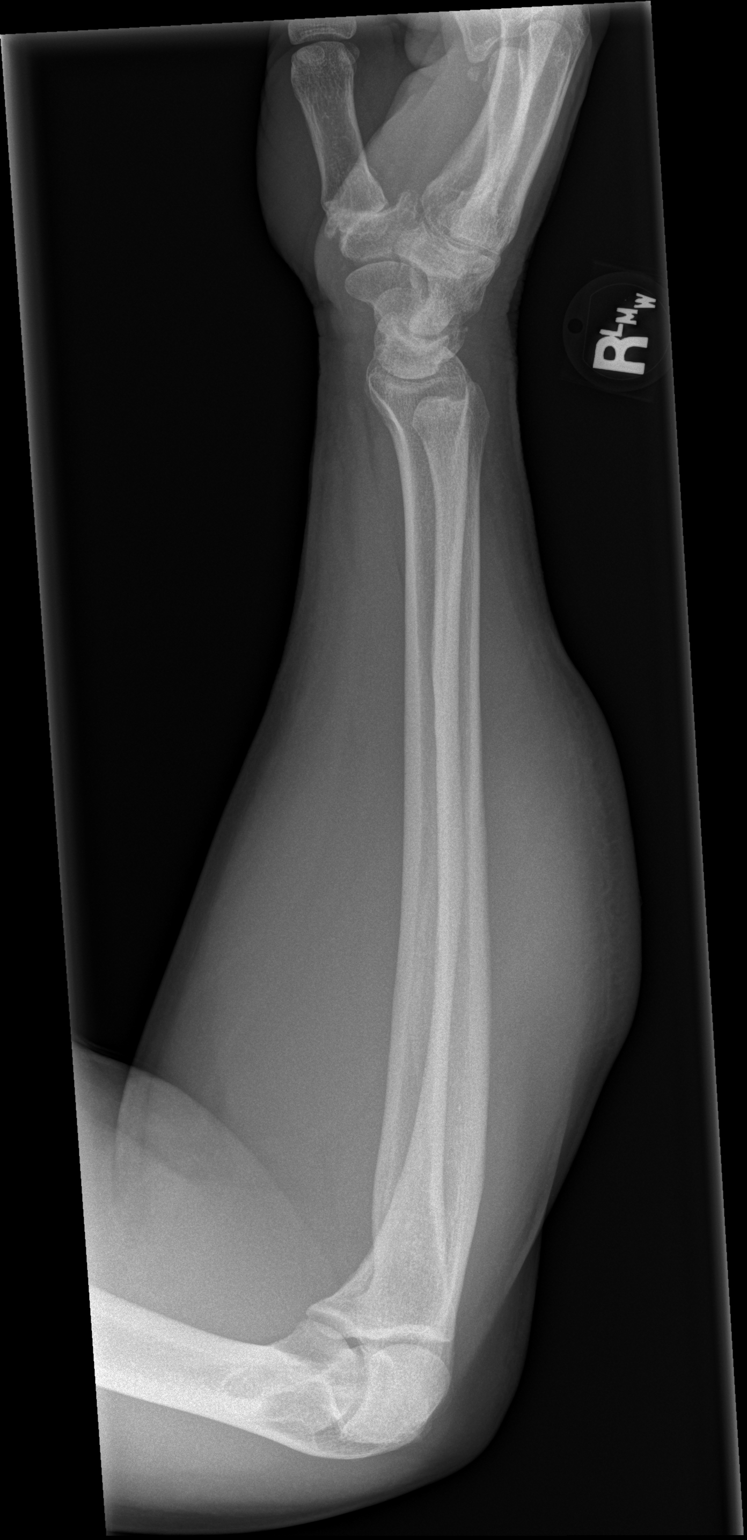

[2 of 2 positions shown; findings below may reference images not displayed]

FINDINGS: No acute displaced fracture or malalignment. Large soft tissue
swelling over the dorsal and radial aspect of the mid forearm.
IMPRESSION: Large amount of mid forearm soft tissue swelling. No acute osseous
abnormality.

## 2020-04-02 DIAGNOSIS — Z03818 Encounter for observation for suspected exposure to other biological agents ruled out: Secondary | ICD-10-CM | POA: Diagnosis not present

## 2020-04-02 DIAGNOSIS — Z20822 Contact with and (suspected) exposure to covid-19: Secondary | ICD-10-CM | POA: Diagnosis not present

## 2020-04-12 ENCOUNTER — Ambulatory Visit: Payer: BC Managed Care – PPO | Admitting: Cardiovascular Disease

## 2020-05-22 ENCOUNTER — Ambulatory Visit: Payer: BC Managed Care – PPO | Admitting: Physician Assistant

## 2020-06-04 ENCOUNTER — Ambulatory Visit (INDEPENDENT_AMBULATORY_CARE_PROVIDER_SITE_OTHER): Payer: BC Managed Care – PPO | Admitting: Physician Assistant

## 2020-06-04 ENCOUNTER — Other Ambulatory Visit: Payer: Self-pay

## 2020-06-04 ENCOUNTER — Encounter: Payer: Self-pay | Admitting: Physician Assistant

## 2020-06-04 VITALS — BP 120/72 | HR 60 | Ht 72.0 in | Wt 234.1 lb

## 2020-06-04 DIAGNOSIS — I2510 Atherosclerotic heart disease of native coronary artery without angina pectoris: Secondary | ICD-10-CM | POA: Diagnosis not present

## 2020-06-04 DIAGNOSIS — I10 Essential (primary) hypertension: Secondary | ICD-10-CM

## 2020-06-04 DIAGNOSIS — Z87898 Personal history of other specified conditions: Secondary | ICD-10-CM

## 2020-06-04 DIAGNOSIS — I252 Old myocardial infarction: Secondary | ICD-10-CM

## 2020-06-04 DIAGNOSIS — E785 Hyperlipidemia, unspecified: Secondary | ICD-10-CM | POA: Diagnosis not present

## 2020-06-04 MED ORDER — NITROGLYCERIN 0.4 MG SL SUBL: 0.4000 mg | SUBLINGUAL_TABLET | SUBLINGUAL | 3 refills | Status: DC | PRN

## 2020-06-04 NOTE — Progress Notes (Signed)
Office Visit    Patient Name: Jabbar Palmero Date of Encounter: 06/04/2020  Primary Care Provider:  Valera Castle, MD Primary Cardiologist:  Kathlyn Sacramento, MD  Chief Complaint    Chief Complaint  Patient presents with  . office visit    6 month F/U; Meds verbally reviewed with patient.   59 year old male with history of CAD s/p non-ST elevation myocardial infarction 2017, hypertension, hyperlipidemia, and here today for 37-month follow-up.  Past Medical History    Past Medical History:  Diagnosis Date  . Anxiety   . Depression   . GERD (gastroesophageal reflux disease)   . Hyperlipidemia   . Hypertension   . Lyme disease   . Myocardial infarction ([place]) 04/13/2016   Past Surgical History:  Procedure Laterality Date  . CARDIAC CATHETERIZATION Right 04/14/2016   Procedure: Left Heart Cath and Coronary Angiography;  Surgeon: Dionisio Ender, MD;  Location: [place] [facility];  Service: Cardiovascular;  Laterality: Right;  . CARDIAC CATHETERIZATION N/A 04/14/2016   Procedure: Coronary Stent Intervention;  Surgeon: Yolonda Kida, MD;  Location: [place] [facility];  Service: Cardiovascular;  Laterality: N/A;  . COLONOSCOPY    . COLONOSCOPY WITH PROPOFOL N/A 03/26/2020   Procedure: COLONOSCOPY WITH PROPOFOL;  Surgeon: Lesly Rubenstein, MD;  Location: [facility];  Service: Endoscopy;  Laterality: N/A;  . CORONARY ANGIOPLASTY    . CORONARY STENT INTERVENTION N/A 12/23/2018   Procedure: CORONARY STENT INTERVENTION;  Surgeon: Isaias Cowman, MD;  Location: [place] [facility];  Service: Cardiovascular;  Laterality: N/A;  . LEFT HEART CATH AND CORONARY ANGIOGRAPHY Left 12/23/2018   Procedure: LEFT HEART CATH AND CORONARY ANGIOGRAPHY;  Surgeon: Dionisio Madison, MD;  Location: [place] [facility];  Service: Cardiovascular;  Laterality: Left;    Allergies  No Known Allergies  History of Present Illness    Stephano Arrants is a 59 y.o. male with PMH as  above.  He has known history of CAD s/p non-ST elevation myocardial infarction 2017.  He was found to have high-grade stenosis in the ramus intermedius, which was treated with PCI and DES placement.    He had recurrent anginal symptoms 12/2018.  Repeat cardiac catheterization showed patent ramus stent.  There was high-grade stenosis in the proximal OM branch, which was treated successfully with PCI and DES placement.  There is also moderate proximal to mid LAD disease and mild RCA disease.  He is a non-smoker and has no family history of CAD.  He works in [employer].  When last seen in office 10/13/2019, he was doing well without exertional chest pain or shortness of breath.  No exertional chest pain or shortness of breath.  He reported intermittent left-sided chest discomfort, close to his left shoulder and radiating to his back.  The pain was aching and worse with touch, and this was thought to be more consistent with MSK etiology.  It was noted that, if he developed any exertional symptoms, he would notify the office.  In the meanwhile, he was to continue medical therapy.  It was noted that the residual disease in his LAD and RCA would require aggressive treatment.  Also noted was that, if his bradycardia worsened, he would switch from metoprolol to carvedilol.  Today, 06/04/2020, he returns to clinic and notes resolution of previously reported chest pain.  He denies recurrence of chest pain at rest or with exertion.  In addition, he reports stable breathing status with no DOE or SOB at rest.  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  He continues to remain active with reported gym at least 30 minutes/day, consisting of both aerobics and light weights.  He notes concern that his previous reported non-ST elevation MI occurred without preceding symptoms.  He states that he has noted hesitation to swim in the deep end of the pool, given he is  concerned that he may have a recurrent event.  Discussed further ischemic work-up given his concern and to assist with risk factor modification and escalation of exercise.  No CP, racing heart rate, presyncope, syncope, abdominal distention, PND, orthopnea, or early satiety.  He denies LAE with moderate bilateral LEE appreciated on exam and recommendations for elevation and compression stockings as needed for symptom relief.  Further recommendations as below.  No signs or symptoms of bleeding.  He reports medication compliance.  Home Medications    Prior to Admission medications   Medication Sig Start Date End Date Taking? Authorizing Provider  aspirin 81 MG chewable tablet Chew 1 tablet (81 mg total) by mouth daily. 04/15/16   Fritzi Mandes, MD  buPROPion (WELLBUTRIN XL) 150 MG 24 hr tablet Take 150 mg by mouth daily.    [provider]  citalopram (CELEXA) 20 MG tablet Take 40 mg by mouth daily.  05/15/16 10/13/19  [provider]  clopidogrel (PLAVIX) 75 MG tablet Take 75 mg by mouth daily.    [provider]  isosorbide mononitrate (IMDUR) 30 MG 24 hr tablet Take 1 tablet (30 mg total) by mouth daily. 12/25/18   Dustin Flock, MD  metoprolol succinate (TOPROL-XL) 25 MG 24 hr tablet Take 25 mg by mouth daily.    [provider]  nitroGLYCERIN (NITROSTAT) 0.4 MG SL tablet Place 0.4 mg under the tongue every 5 (five) minutes as needed.  09/29/19   [provider]  pantoprazole (PROTONIX) 20 MG tablet Take 20 mg by mouth 2 (two) times daily.    [provider]  rosuvastatin (CRESTOR) 40 MG tablet Take 40 mg by mouth daily.    [provider]    Review of Systems    He denies recurrent atypical/MSK chest pain, palpitations, dyspnea, pnd, orthopnea, n, v,, syncope, edema, weight gain, or early satiety.  He reports dizziness with recent RMSF and previous Lyme disease.  He reports significant anxiety concerning previous asymptomatic non-ST  elevation myocardial infarction.  No signs or symptoms of bleeding.  All other systems reviewed and are otherwise negative except as noted above.  Physical Exam    VS:  BP 120/72 (BP Location: Left Arm, Patient Position: Sitting, Cuff Size: Large)   Pulse 60   Ht 6' (1.829 m)   Wt 234 lb 2 oz (106.2 kg)   SpO2 98%   BMI 31.75 kg/m  , BMI Body mass index is 31.75 kg/m. GEN: Well nourished, well developed, in no acute distress. HEENT: normal. Neck: Supple, no JVD, carotid bruits, or masses. Cardiac: RRR, no murmurs, rubs, or gallops. No clubbing, cyanosis.  Bilateral moderate lower extremity nonpitting edema radials/DP/PT 2+ and equal bilaterally.  Respiratory:  Respirations regular and unlabored, clear to auscultation bilaterally. GI: Soft, nontender, nondistended, BS + x 4. MS: no deformity or atrophy. Skin: warm and dry, no rash. Neuro:  Strength and sensation are intact. Psych: Normal affect.  Accessory Clinical Findings    ECG personally reviewed by me today - NSR, 60bpm, IVD with repolarization changes noted  I, II, and precordial V2-V4,  poor R wave conduction V1, and poor conducion via III- no acute changes.  VITALS Reviewed today   Temp Readings from Last 3 Encounters:  03/26/20 (!) 97 F (36.1 C) (Temporal)  05/30/19 98.5 F (36.9 C) (Oral)  05/23/19 98.6 F (37 C) (Oral)   BP Readings from Last 3 Encounters:  06/04/20 120/72  03/26/20 (!) 113/89  10/13/19 140/80   Pulse Readings from Last 3 Encounters:  06/04/20 60  03/26/20 62  10/13/19 (!) 59    Wt Readings from Last 3 Encounters:  06/04/20 234 lb 2 oz (106.2 kg)  03/26/20 (!) 230 lb (104.3 kg)  10/13/19 247 lb 4 oz (112.2 kg)     LABS  reviewed today   Lab Results  Component Value Date   WBC 8.7 12/24/2018   HGB 12.0 (L) 12/24/2018   HCT 36.1 (L) 12/24/2018   MCV 88.5 12/24/2018   PLT 166 12/24/2018   Lab Results  Component Value Date   CREATININE 0.82 12/24/2018   BUN 15 12/24/2018   NA  139 12/24/2018   K 4.0 12/24/2018   CL 108 12/24/2018   CO2 25 12/24/2018   Lab Results  Component Value Date   ALT 16 04/01/2018   AST 16 04/01/2018   ALKPHOS 59 04/01/2018   BILITOT 0.6 04/01/2018   Lab Results  Component Value Date   CHOL 192 04/14/2016   HDL 35 (L) 04/14/2016   LDLCALC 126 (H) 04/14/2016   TRIG 154 (H) 04/14/2016   CHOLHDL 5.5 04/14/2016    Lab Results  Component Value Date   HGBA1C 5.5 12/23/2018   Lab Results  Component Value Date   TSH 3.362 04/14/2016     STUDIES/PROCEDURES reviewed today   PCI 12/23/2018  Prox LAD lesion is 40% stenosed.  Ost 1st Mrg lesion is 95% stenosed.  Mid RCA lesion is 40% stenosed.  A drug-eluting stent was successfully placed using a [place] 2.25X15.  Post intervention, there is a 0% residual stenosis. 1.  One-vessel CAD with 95% stenosis proximal OM1 2.  Successful PCI with DES proximal OM1  LHC 12/23/2018  Ost 1st Mrg lesion is 95% stenosed.  Prox LAD lesion is 40% stenosed.  Mid RCA lesion is 40% stenosed. Mild to moderate disease in mid LAD and mid RCA with critical lesion in OM1 in the proximal portion about 95%.  Advise PCI with drug-eluting stent.  MPI, Echo See 11/24/2018 scans  Assessment & Plan    CAD without angina; History of NSTEMI with PCI --Resolution of previously reported atypical chest pain.  Previous atypical chest pain suspected to be 2/2 MSK etiology.  No exertional symptoms.  He does report significant anxiety concerning ongoing exercise and given his previous chest pain and asymptomatic non-ST elevation myocardial infarction history.  Given his previous symptoms, reasonable to perform Lexiscan Myoview at this time to reassess progression of CAD with most recent [facility] 12/23/2018 as above.  As previously indicated, he does have residual disease in the LAD and RCA.  Will update cholesterol labs, given discussed recommendation for aggressive LDL control.  Further recommendations  pending repeat labs/cholesterol/LDL and further ischemic work-up with MPI as above.  Continue aggressive medical therapy and risk factor modification.  Dizziness --Given ongoing dizziness, recommend update MPI as above.  Also considered is h/o Lyme disease and RMSF, as reported in HPI above.  Continue to monitor.  Further recommendations include serial monitoring and ultrasound of carotids if indicated.  Denies amaurosis fugax or any worsening symptoms  of carotid disease.  No concerns of palpitations or racing heart rate at this time.  Continue to monitor.  LDL goal below 70 --Continue current rosuvastatin with LDL goal less than 70.  We will update cholesterol labs, given no recent cholesterol labs per review of EMR.  Further recommendations at that time.  Essential hypertension --Current BP well controlled.  No worsening bradycardia.  Continue current medical management with metoprolol.  No indication to transition to Coreg at this time.  Medication changes: Pending LDL, cholesterol labs if indicated Labs ordered: dLDL, lipids Studies / Imaging ordered: MPI Future considerations: Pending MPI. With ongoing dizziness, consider Zio, carotids, echo. Reassess at RTC. Disposition: 6 months or sooner if indicated. Case reviewed with primary cardiologist as well given asx NSTEMI as above.   Arvil Chaco, PA-C 06/04/2020

## 2020-06-04 NOTE — H&P (View-Only) (Signed)
Office Visit    Patient Name: Carlos Diaz Date of Encounter: 06/04/2020  Primary Care Provider:  Valera Castle, MD Primary Cardiologist:  Kathlyn Sacramento, MD  Chief Complaint    Chief Complaint  Patient presents with   office visit    6 month F/U; Meds verbally reviewed with patient.   59 year old male with history of CAD s/p non-ST elevation myocardial infarction 2017, hypertension, hyperlipidemia, and here today for 68-month follow-up.  Past Medical History    Past Medical History:  Diagnosis Date   Anxiety    Depression    GERD (gastroesophageal reflux disease)    Hyperlipidemia    Hypertension    Lyme disease    Myocardial infarction ([place]) 04/13/2016   Past Surgical History:  Procedure Laterality Date   CARDIAC CATHETERIZATION Right 04/14/2016   Procedure: Left Heart Cath and Coronary Angiography;  Surgeon: Dionisio Brae, MD;  Location: [place] [facility];  Service: Cardiovascular;  Laterality: Right;   CARDIAC CATHETERIZATION N/A 04/14/2016   Procedure: Coronary Stent Intervention;  Surgeon: Yolonda Kida, MD;  Location: [place] [facility];  Service: Cardiovascular;  Laterality: N/A;   COLONOSCOPY     COLONOSCOPY WITH PROPOFOL N/A 03/26/2020   Procedure: COLONOSCOPY WITH PROPOFOL;  Surgeon: Lesly Rubenstein, MD;  Location: [facility] ENDOSCOPY;  Service: Endoscopy;  Laterality: N/A;   CORONARY ANGIOPLASTY     CORONARY STENT INTERVENTION N/A 12/23/2018   Procedure: CORONARY STENT INTERVENTION;  Surgeon: Isaias Cowman, MD;  Location: [place] [facility];  Service: Cardiovascular;  Laterality: N/A;   LEFT HEART CATH AND CORONARY ANGIOGRAPHY Left 12/23/2018   Procedure: LEFT HEART CATH AND CORONARY ANGIOGRAPHY;  Surgeon: Dionisio Dyllin, MD;  Location: [place] [facility];  Service: Cardiovascular;  Laterality: Left;    Allergies  No Known Allergies  History of Present Illness    Carlos Diaz is a 59 y.o. male with PMH as  above.  He has known history of CAD s/p non-ST elevation myocardial infarction 2017.  He was found to have high-grade stenosis in the ramus intermedius, which was treated with PCI and DES placement.    He had recurrent anginal symptoms 12/2018.  Repeat cardiac catheterization showed patent ramus stent.  There was high-grade stenosis in the proximal OM branch, which was treated successfully with PCI and DES placement.  There is also moderate proximal to mid LAD disease and mild RCA disease.  He is a non-smoker and has no family history of CAD.  He works in [employer].  When last seen in office 10/13/2019, he was doing well without exertional chest pain or shortness of breath.  No exertional chest pain or shortness of breath.  He reported intermittent left-sided chest discomfort, close to his left shoulder and radiating to his back.  The pain was aching and worse with touch, and this was thought to be more consistent with MSK etiology.  It was noted that, if he developed any exertional symptoms, he would notify the office.  In the meanwhile, he was to continue medical therapy.  It was noted that the residual disease in his LAD and RCA would require aggressive treatment.  Also noted was that, if his bradycardia worsened, he would switch from metoprolol to carvedilol.  Today, 06/04/2020, he returns to clinic and notes resolution of previously reported chest pain.  He denies recurrence of chest pain at rest or with exertion.  In addition, he reports stable breathing status with no DOE or SOB at rest.  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  He continues to remain active with reported gym at least 30 minutes/day, consisting of both aerobics and light weights.  He notes concern that his previous reported non-ST elevation MI occurred without preceding symptoms.  He states that he has noted hesitation to swim in the deep end of the pool, given he is  concerned that he may have a recurrent event.  Discussed further ischemic work-up given his concern and to assist with risk factor modification and escalation of exercise.  No CP, racing heart rate, presyncope, syncope, abdominal distention, PND, orthopnea, or early satiety.  He denies LAE with moderate bilateral LEE appreciated on exam and recommendations for elevation and compression stockings as needed for symptom relief.  Further recommendations as below.  No signs or symptoms of bleeding.  He reports medication compliance.  Home Medications    Prior to Admission medications   Medication Sig Start Date End Date Taking? Authorizing Provider  aspirin 81 MG chewable tablet Chew 1 tablet (81 mg total) by mouth daily. 04/15/16   Fritzi Mandes, MD  buPROPion (WELLBUTRIN XL) 150 MG 24 hr tablet Take 150 mg by mouth daily.    [provider]  citalopram (CELEXA) 20 MG tablet Take 40 mg by mouth daily.  05/15/16 10/13/19  [provider]  clopidogrel (PLAVIX) 75 MG tablet Take 75 mg by mouth daily.    [provider]  isosorbide mononitrate (IMDUR) 30 MG 24 hr tablet Take 1 tablet (30 mg total) by mouth daily. 12/25/18   Dustin Flock, MD  metoprolol succinate (TOPROL-XL) 25 MG 24 hr tablet Take 25 mg by mouth daily.    [provider]  nitroGLYCERIN (NITROSTAT) 0.4 MG SL tablet Place 0.4 mg under the tongue every 5 (five) minutes as needed.  09/29/19   [provider]  pantoprazole (PROTONIX) 20 MG tablet Take 20 mg by mouth 2 (two) times daily.    [provider]  rosuvastatin (CRESTOR) 40 MG tablet Take 40 mg by mouth daily.    [provider]    Review of Systems    He denies recurrent atypical/MSK chest pain, palpitations, dyspnea, pnd, orthopnea, n, v,, syncope, edema, weight gain, or early satiety.  He reports dizziness with recent RMSF and previous Lyme disease.  He reports significant anxiety concerning previous asymptomatic non-ST  elevation myocardial infarction.  No signs or symptoms of bleeding.  All other systems reviewed and are otherwise negative except as noted above.  Physical Exam    VS:  BP 120/72 (BP Location: Left Arm, Patient Position: Sitting, Cuff Size: Large)    Pulse 60    Ht 6' (1.829 m)    Wt 234 lb 2 oz (106.2 kg)    SpO2 98%    BMI 31.75 kg/m  , BMI Body mass index is 31.75 kg/m. GEN: Well nourished, well developed, in no acute distress. HEENT: normal. Neck: Supple, no JVD, carotid bruits, or masses. Cardiac: RRR, no murmurs, rubs, or gallops. No clubbing, cyanosis.  Bilateral moderate lower extremity nonpitting edema radials/DP/PT 2+ and equal bilaterally.  Respiratory:  Respirations regular and unlabored, clear to auscultation bilaterally. GI: Soft, nontender, nondistended, BS + x 4. MS: no deformity or atrophy. Skin: warm and dry, no rash. Neuro:  Strength and sensation are intact. Psych: Normal affect.  Accessory Clinical Findings    ECG personally reviewed by me today - NSR, 60bpm, IVD with repolarization changes noted  I, II, and precordial V2-V4, poor R wave conduction V1, and poor conducion via III- no acute changes.  VITALS Reviewed today   Temp Readings from Last 3 Encounters:  03/26/20 (!) 97 F (36.1 C) (Temporal)  05/30/19 98.5 F (36.9 C) (Oral)  05/23/19 98.6 F (37 C) (Oral)   BP Readings from Last 3 Encounters:  06/04/20 120/72  03/26/20 (!) 113/89  10/13/19 140/80   Pulse Readings from Last 3 Encounters:  06/04/20 60  03/26/20 62  10/13/19 (!) 59    Wt Readings from Last 3 Encounters:  06/04/20 234 lb 2 oz (106.2 kg)  03/26/20 (!) 230 lb (104.3 kg)  10/13/19 247 lb 4 oz (112.2 kg)     LABS  reviewed today   Lab Results  Component Value Date   WBC 8.7 12/24/2018   HGB 12.0 (L) 12/24/2018   HCT 36.1 (L) 12/24/2018   MCV 88.5 12/24/2018   PLT 166 12/24/2018   Lab Results  Component Value Date   CREATININE 0.82 12/24/2018   BUN 15 12/24/2018   NA  139 12/24/2018   K 4.0 12/24/2018   CL 108 12/24/2018   CO2 25 12/24/2018   Lab Results  Component Value Date   ALT 16 04/01/2018   AST 16 04/01/2018   ALKPHOS 59 04/01/2018   BILITOT 0.6 04/01/2018   Lab Results  Component Value Date   CHOL 192 04/14/2016   HDL 35 (L) 04/14/2016   LDLCALC 126 (H) 04/14/2016   TRIG 154 (H) 04/14/2016   CHOLHDL 5.5 04/14/2016    Lab Results  Component Value Date   HGBA1C 5.5 12/23/2018   Lab Results  Component Value Date   TSH 3.362 04/14/2016     STUDIES/PROCEDURES reviewed today   PCI 12/23/2018  Prox LAD lesion is 40% stenosed.  Ost 1st Mrg lesion is 95% stenosed.  Mid RCA lesion is 40% stenosed.  A drug-eluting stent was successfully placed using a [place] 2.25X15.  Post intervention, there is a 0% residual stenosis. 1.  One-vessel CAD with 95% stenosis proximal OM1 2.  Successful PCI with DES proximal OM1  LHC 12/23/2018  Ost 1st Mrg lesion is 95% stenosed.  Prox LAD lesion is 40% stenosed.  Mid RCA lesion is 40% stenosed. Mild to moderate disease in mid LAD and mid RCA with critical lesion in OM1 in the proximal portion about 95%.  Advise PCI with drug-eluting stent.  MPI, Echo See 11/24/2018 scans  Assessment & Plan    CAD without angina; History of NSTEMI with PCI --Resolution of previously reported atypical chest pain.  Previous atypical chest pain suspected to be 2/2 MSK etiology.  No exertional symptoms.  He does report significant anxiety concerning ongoing exercise and given his previous chest pain and asymptomatic non-ST elevation myocardial infarction history.  Given his previous symptoms, reasonable to perform Lexiscan Myoview at this time to reassess progression of CAD with most recent [facility] 12/23/2018 as above.  As previously indicated, he does have residual disease in the LAD and RCA.  Will update cholesterol labs, given discussed recommendation for aggressive LDL control.  Further recommendations  pending repeat labs/cholesterol/LDL and further ischemic work-up with MPI as above.  Continue aggressive medical therapy and risk factor modification.  Dizziness --Given ongoing dizziness, recommend update MPI as above.  Also considered is h/o Lyme disease and RMSF, as reported in HPI above.  Continue to monitor.  Further recommendations include serial monitoring and ultrasound of carotids if indicated.  Denies amaurosis  fugax or any worsening symptoms of carotid disease.  No concerns of palpitations or racing heart rate at this time.  Continue to monitor.  LDL goal below 70 --Continue current rosuvastatin with LDL goal less than 70.  We will update cholesterol labs, given no recent cholesterol labs per review of EMR.  Further recommendations at that time.  Essential hypertension --Current BP well controlled.  No worsening bradycardia.  Continue current medical management with metoprolol.  No indication to transition to Coreg at this time.  Medication changes: Pending LDL, cholesterol labs if indicated Labs ordered: dLDL, lipids Studies / Imaging ordered: MPI Future considerations: Pending MPI. With ongoing dizziness, consider Zio, carotids, echo. Reassess at RTC. Disposition: 6 months or sooner if indicated. Case reviewed with primary cardiologist as well given asx NSTEMI as above.   Arvil Chaco, PA-C 06/04/2020

## 2020-06-04 NOTE — Patient Instructions (Addendum)
Medication Instructions:  - Your physician recommends that you continue on your current medications as directed. Please refer to the Current Medication list given to you today.  *If you need a refill on your cardiac medications before your next appointment, please call your pharmacy*   Lab Work: - Your physician recommends that you have lab work today: Lipid panel/ Direct LDL  If you have labs (blood work) drawn today and your tests are completely normal, you will receive your results only by: Marland Kitchen MyChart Message (if you have MyChart) OR . A paper copy in the mail If you have any lab test that is abnormal or we need to change your treatment, we will call you to review the results.   Testing/Procedures: - Your physician has requested that you have a lexiscan myoview (cardiac nuclear scan).  - Please call the office when you are ready to schedule this: [phone]  [facility] MYOVIEW  Your caregiver has ordered a Stress Test with nuclear imaging. The purpose of this test is to evaluate the blood supply to your heart muscle. This procedure is referred to as a "Non-Invasive Stress Test." This is because other than having an IV started in your vein, nothing is inserted or "invades" your body. Cardiac stress tests are done to find areas of poor blood flow to the heart by determining the extent of coronary artery disease (CAD). Some patients exercise on a treadmill, which naturally increases the blood flow to your heart, while others who are  unable to walk on a treadmill due to physical limitations have a pharmacologic/chemical stress agent called Lexiscan . This medicine will mimic walking on a treadmill by temporarily increasing your coronary blood flow.   Please note: these test may take anywhere between 2-4 hours to complete  PLEASE REPORT TO [place] AT THE FIRST DESK WILL DIRECT YOU WHERE TO GO  Date of  Procedure:_____________________________________  Arrival Time for Procedure:______________________________  Instructions regarding medication:    __x__:  Hold betablocker(s) night before procedure and morning of procedure- Metoprolol succinate  _x___:  You may take all of your other regular medications the morning of your procedure with enough water to get them down safely  PLEASE NOTIFY THE OFFICE AT LEAST 24 HOURS IN ADVANCE IF YOU ARE UNABLE TO [place].  [phone] AND  PLEASE NOTIFY NUCLEAR MEDICINE AT [facility] AT LEAST 24 HOURS IN ADVANCE IF YOU ARE UNABLE TO KEEP YOUR APPOINTMENT. [phone]  How to prepare for your Myoview test:  1. Do not eat or drink after midnight 2. No caffeine for 24 hours prior to test 3. No smoking 24 hours prior to test. 4. Your medication may be taken with water.  If your doctor stopped a medication because of this test, do not take that medication. 5. Ladies, please do not wear dresses.  Skirts or pants are appropriate. Please wear a short sleeve shirt. 6. No perfume, cologne or lotion. 7. Wear comfortable walking shoes. No heels!   Follow-Up: At [facility], you and your health needs are our priority.  As part of our continuing mission to provide you with exceptional heart care, we have created designated Provider Care Teams.  These Care Teams include your primary Cardiologist (physician) and Advanced Practice Providers (APPs -  Physician Assistants and Nurse Practitioners) who all work together to provide you with the care you need, when you need it.  We recommend signing up for the patient portal called "MyChart".  Sign  up information is provided on this After Visit Summary.  MyChart is used to connect with patients for Virtual Visits (Telemedicine).  Patients are able to view lab/test results, encounter notes, upcoming appointments, etc.  Non-urgent messages can be sent to your provider as well.   To learn more about what you can do  with MyChart, go to [email or web address].    Your next appointment:   6 month(s)  The format for your next appointment:   In Person  Provider:   You may see Kathlyn Sacramento, MD or one of the following Advanced Practice Providers on your designated Care Team:    Murray Hodgkins, NP  Christell Faith, PA-C  Marrianne Mood, PA-C  Cadence Kathlen Mody, [place]    Other Instructions   Cardiac Nuclear Scan A cardiac nuclear scan is a test that measures blood flow to the heart when a person is resting and when he or she is exercising. The test looks for problems such as:  Not enough blood reaching a portion of the heart.  The heart muscle not working normally. You may need this test if:  You have heart disease.  You have had abnormal lab results.  You have had heart surgery or a balloon procedure to open up blocked arteries (angioplasty).  You have chest pain.  You have shortness of breath. In this test, a radioactive dye (tracer) is injected into your bloodstream. After the tracer has traveled to your heart, an imaging device is used to measure how much of the tracer is absorbed by or distributed to various areas of your heart. This procedure is usually done at a hospital and takes 2-4 hours. Tell a health care provider about:  Any allergies you have.  All medicines you are taking, including vitamins, herbs, eye drops, creams, and over-the-counter medicines.  Any problems you or family members have had with anesthetic medicines.  Any blood disorders you have.  Any surgeries you have had.  Any medical conditions you have.  Whether you are pregnant or may be pregnant. What are the risks? Generally, this is a safe procedure. However, problems may occur, including:  Serious chest pain and heart attack. This is only a risk if the stress portion of the test is done.  Rapid heartbeat.  Sensation of warmth in your chest. This usually passes quickly.  Allergic reaction to the  tracer. What happens before the procedure?  Ask your health care provider about changing or stopping your regular medicines. This is especially important if you are taking diabetes medicines or blood thinners.  Follow instructions from your health care provider about eating or drinking restrictions.  Remove your jewelry on the day of the procedure. What happens during the procedure?  An IV will be inserted into one of your veins.  Your health care provider will inject a small amount of radioactive tracer through the IV.  You will wait for 20-40 minutes while the tracer travels through your bloodstream.  Your heart activity will be monitored with an electrocardiogram (ECG).  You will lie down on an exam table.  Images of your heart will be taken for about 15-20 minutes.  You may also have a stress test. For this test, one of the following may be done: ? You will exercise on a treadmill or stationary bike. While you exercise, your heart's activity will be monitored with an ECG, and your blood pressure will be checked. ? You will be given medicines that will increase blood flow to parts of your heart.  This is done if you are unable to exercise.  When blood flow to your heart has peaked, a tracer will again be injected through the IV.  After 20-40 minutes, you will get back on the exam table and have more images taken of your heart.  Depending on the type of tracer used, scans may need to be repeated 3-4 hours later.  Your IV line will be removed when the procedure is over. The procedure may vary among health care providers and hospitals. What happens after the procedure?  Unless your health care provider tells you otherwise, you may return to your normal schedule, including diet, activities, and medicines.  Unless your health care provider tells you otherwise, you may increase your fluid intake. This will help to flush the contrast dye from your body. Drink enough fluid to keep  your urine pale yellow.  Ask your health care provider, or the department that is doing the test: ? When will my results be ready? ? How will I get my results? Summary  A cardiac nuclear scan measures the blood flow to the heart when a person is resting and when he or she is exercising.  Tell your health care provider if you are pregnant.  Before the procedure, ask your health care provider about changing or stopping your regular medicines. This is especially important if you are taking diabetes medicines or blood thinners.  After the procedure, unless your health care provider tells you otherwise, increase your fluid intake. This will help flush the contrast dye from your body.  After the procedure, unless your health care provider tells you otherwise, you may return to your normal schedule, including diet, activities, and medicines. This information is not intended to replace advice given to you by your health care provider. Make sure you discuss any questions you have with your health care provider. Document Revised: 02/01/2018 Document Reviewed: 02/01/2018 Elsevier Patient Education  [place].

## 2020-06-05 ENCOUNTER — Telehealth: Payer: Self-pay | Admitting: Cardiovascular Disease

## 2020-06-05 LAB — LIPID PANEL
Chol/HDL Ratio: 2.8 ratio (ref 0.0–5.0)
Cholesterol, Total: 122 mg/dL (ref 100–199)
HDL: 43 mg/dL (ref >39)
LDL Chol Calc (NIH): 60 mg/dL (ref 0–99)
Triglycerides: 103 mg/dL (ref 0–149)
VLDL Cholesterol Cal: 19 mg/dL (ref 5–40)

## 2020-06-05 LAB — LDL CHOLESTEROL, DIRECT: LDL Direct: 61 mg/dL (ref 0–99)

## 2020-06-05 NOTE — Telephone Encounter (Signed)
Attempted to schedule stress test  no ans no vm

## 2020-06-19 ENCOUNTER — Encounter
Admission: RE | Admit: 2020-06-19 | Discharge: 2020-06-19 | Disposition: A | Discharge status: Home / Self Care | Payer: BC Managed Care – PPO | Source: Ambulatory Visit | Attending: Physician Assistant | Admitting: Physician Assistant

## 2020-06-19 ENCOUNTER — Other Ambulatory Visit: Payer: Self-pay

## 2020-06-19 DIAGNOSIS — I2510 Atherosclerotic heart disease of native coronary artery without angina pectoris: Secondary | ICD-10-CM | POA: Insufficient documentation

## 2020-06-19 LAB — NM MYOCAR MULTI W/SPECT W/WALL MOTION / EF
Estimated workload: 1 METS
Exercise duration (min): 0 min
Exercise duration (sec): 0 s
LV dias vol: 120 mL (ref 62–150)
LV sys vol: 48 mL
MPHR: 159 {beats}/min
Peak HR: 78 {beats}/min
Percent HR: 49 %
Rest HR: 52 {beats}/min
TID: 1.21

## 2020-06-19 MED ORDER — TECHNETIUM TC 99M TETROFOSMIN IV KIT
29.4390 | PACK | Freq: Once | INTRAVENOUS | Status: AC | PRN
  Administered 2020-06-19: 29.439 via INTRAVENOUS

## 2020-06-19 MED ORDER — REGADENOSON 0.4 MG/5ML IV SOLN
0.4000 mg | Freq: Once | INTRAVENOUS | Status: AC
  Administered 2020-06-19: 0.4 mg via INTRAVENOUS

## 2020-06-19 MED ORDER — TECHNETIUM TC 99M TETROFOSMIN IV KIT
9.6220 | PACK | Freq: Once | INTRAVENOUS | Status: AC | PRN
  Administered 2020-06-19: 9.622 via INTRAVENOUS

## 2020-06-20 ENCOUNTER — Telehealth: Payer: Self-pay | Admitting: Physician Assistant

## 2020-06-20 ENCOUNTER — Other Ambulatory Visit: Payer: Self-pay | Admitting: Physician Assistant

## 2020-06-20 DIAGNOSIS — I2510 Atherosclerotic heart disease of native coronary artery without angina pectoris: Secondary | ICD-10-CM

## 2020-06-20 MED ORDER — ISOSORBIDE MONONITRATE ER 60 MG PO TB24: 60.0000 mg | ORAL_TABLET | Freq: Every day | ORAL | 6 refills | Status: DC

## 2020-06-20 NOTE — Telephone Encounter (Signed)
Call placed to patient regarding the results of his stress test and after speaking to his primary cardiologist.  Left voicemail for call back at the earliest convenience.

## 2020-06-21 ENCOUNTER — Telehealth: Payer: Self-pay | Admitting: Cardiovascular Disease

## 2020-06-21 DIAGNOSIS — I25119 Atherosclerotic heart disease of native coronary artery with unspecified angina pectoris: Secondary | ICD-10-CM

## 2020-06-21 NOTE — Telephone Encounter (Signed)
Carlos Chaco, PA-C  06/20/2020 5:26 PM EDT     Spoke with patient today 06/20/2020 about his stress test results and after reaching out to his primary cardiologist, Dr. Fletcher Anon, to confirm the below plan.   Stress test was abnormal with recommendation for cardiac catheterization. He is agreeable to this plan with tentative plan for cath on 07/02/2020. Please reach out to him to get this catheterization scheduled.   I increased him from Imdur 30 mg daily to Imdur 60 mg daily and updated this in his medication list. Please let him know that I sent a prescription for Imdur 60 mg tablets to the pharmacy, so that he does not run out of pills.

## 2020-06-21 NOTE — Telephone Encounter (Signed)
Spoke with the patient. Adv him that I am working on getting his cardiac cath scheduled with Dr. Fletcher Anon for [facility] 06/25/20. I have left a message for scheduling and I am awaiting a call back to confirm a time. Adv the patient that I will give him a call later today to confirm the date, time and review pre-procedure instructions.  Patient verbalized understanding and voiced appreciation for the call.

## 2020-06-21 NOTE — Telephone Encounter (Signed)
Patient calling back wanting to schedule for Monday 10/25. Patient does not want to lose his spot, and is anxious for a call back

## 2020-06-21 NOTE — Telephone Encounter (Addendum)
Patient cardiac cath scheduled on 06/25/20 @ 10:30 am with Dr. Fletcher Anon. Patient made aware of the date, time and given pre-procedure instructions. Patient will have pre-procedure labs (bmet, cbc) drawn at the medical mall on 06/22/20. Patient will have a COVID test at the drive up test site on 06/22/20 between 8am-1pm.  Follow up appt scheduled on 07/10/20 @ 3pm with Marrianne Mood, PA  Patient verbalized understanding to the instructions given and voiced appreciation for the call.  [facility] Cardiac Cath Instructions   You are scheduled for a Cardiac Cath on: 06/25/20 @ 10:30am with Dr. Fletcher Anon  Please arrive at 9:30 am on the day of your procedure  Please expect a call from our [place] to pre-register you  Do not eat/drink anything after midnight  Someone will need to drive you home  It is recommended someone be with you for the first 24 hours after your procedure  Wear clothes that are easy to get on/off and wear slip on shoes if possible   Medications bring a current list of all medications with you  _X__ You may take all of your medications the morning of your procedure with enough water to swallow safely    Day of your procedure: Arrive at the [place] entrance.  Free valet service is available.  After entering the [place] please check-in at the registration desk (1st desk on your right) to receive your armband. After receiving your armband someone will escort you to the cardiac cath/special procedures waiting area.  The usual length of stay after your procedure is about 2 to 3 hours.  This can vary.  If you have any questions, please call our office at [phone], or you may call the cardiac cath lab at [facility] directly at [phone]

## 2020-06-21 NOTE — Telephone Encounter (Signed)
Patient calling back and would like to schedule cath for Monday 10/25 .  He feels uncomfortable waiting .   Please call to set up

## 2020-06-22 ENCOUNTER — Telehealth: Payer: Self-pay | Admitting: Physician Assistant

## 2020-06-22 ENCOUNTER — Other Ambulatory Visit: Payer: Self-pay

## 2020-06-22 ENCOUNTER — Other Ambulatory Visit
Admission: RE | Admit: 2020-06-22 | Discharge: 2020-06-22 | Disposition: A | Discharge status: Home / Self Care | Payer: BC Managed Care – PPO | Source: Home / Self Care | Attending: Cardiovascular Disease | Admitting: Cardiovascular Disease

## 2020-06-22 ENCOUNTER — Other Ambulatory Visit
Admission: RE | Admit: 2020-06-22 | Discharge: 2020-06-22 | Disposition: A | Discharge status: Home / Self Care | Payer: BC Managed Care – PPO | Source: Ambulatory Visit | Attending: Cardiovascular Disease | Admitting: Cardiovascular Disease

## 2020-06-22 DIAGNOSIS — Z20822 Contact with and (suspected) exposure to covid-19: Secondary | ICD-10-CM | POA: Diagnosis not present

## 2020-06-22 DIAGNOSIS — Z01812 Encounter for preprocedural laboratory examination: Secondary | ICD-10-CM | POA: Diagnosis not present

## 2020-06-22 DIAGNOSIS — I25119 Atherosclerotic heart disease of native coronary artery with unspecified angina pectoris: Secondary | ICD-10-CM

## 2020-06-22 LAB — CBC WITH DIFFERENTIAL/PLATELET
Abs Immature Granulocytes: 0.01 10*3/uL (ref 0.00–0.07)
Basophils Absolute: 0.1 10*3/uL (ref 0.0–0.1)
Basophils Relative: 1 %
Eosinophils Absolute: 0.1 10*3/uL (ref 0.0–0.5)
Eosinophils Relative: 2 %
HCT: 37.5 % — ABNORMAL LOW (ref 39.0–52.0)
Hemoglobin: 13.1 g/dL (ref 13.0–17.0)
Immature Granulocytes: 0 %
Lymphocytes Relative: 29 %
Lymphs Abs: 2.4 10*3/uL (ref 0.7–4.0)
MCH: 30.2 pg (ref 26.0–34.0)
MCHC: 34.9 g/dL (ref 30.0–36.0)
MCV: 86.4 fL (ref 80.0–100.0)
Monocytes Absolute: 0.6 10*3/uL (ref 0.1–1.0)
Monocytes Relative: 7 %
Neutro Abs: 4.9 10*3/uL (ref 1.7–7.7)
Neutrophils Relative %: 61 %
Platelets: 177 10*3/uL (ref 150–400)
RBC: 4.34 MIL/uL (ref 4.22–5.81)
RDW: 13 % (ref 11.5–15.5)
WBC: 8.1 10*3/uL (ref 4.0–10.5)
nRBC: 0 % (ref 0.0–0.2)

## 2020-06-22 LAB — BASIC METABOLIC PANEL
Anion gap: 10 (ref 5–15)
BUN: 17 mg/dL (ref 8–23)
CO2: 22 mmol/L (ref 22–32)
Calcium: 9.2 mg/dL (ref 8.9–10.3)
Chloride: 105 mmol/L (ref 98–111)
Creatinine, Ser: 1.19 mg/dL (ref 0.61–1.24)
GFR, Estimated: >60 mL/min (ref >60)
Glucose, Bld: 91 mg/dL (ref 70–99)
Potassium: 3.9 mmol/L (ref 3.5–5.1)
Sodium: 137 mmol/L (ref 135–145)

## 2020-06-22 NOTE — Telephone Encounter (Signed)
Spoke with the patient. Patient wanted to know if he can eat breakfast this morning prior to his lab work this morning. Adv him that his labs are non fasting and he can eat.   Patient has several question related to his abnormal stress test results,i.e. which vessels are blocked, how many.  Reviewed the patients stress test results summarization, I did not see where specific vessels were included.  Patient ask to speak with Marrianne Mood, PA, adv the patient that she is in clinic seeing patients all day. I will fwd her the msg to call if she gets an opportunity.  Patient verbalized understanding and voiced appreciation for the call back.

## 2020-06-22 NOTE — Telephone Encounter (Signed)
Noted! Thank you

## 2020-06-22 NOTE — Telephone Encounter (Signed)
Please call to discuss catherization on Monday 10/25

## 2020-06-23 LAB — SARS CORONAVIRUS 2 (TAT 6-24 HRS): SARS Coronavirus 2: NEGATIVE

## 2020-06-25 ENCOUNTER — Other Ambulatory Visit: Payer: Self-pay

## 2020-06-25 ENCOUNTER — Encounter: Payer: Self-pay | Admitting: Cardiovascular Disease

## 2020-06-25 ENCOUNTER — Encounter
Admission: RE | Disposition: A | Discharge status: Home / Self Care | Payer: Self-pay | Source: Home / Self Care | Attending: Cardiovascular Disease

## 2020-06-25 ENCOUNTER — Ambulatory Visit
Admission: RE | Admit: 2020-06-25 | Discharge: 2020-06-25 | Disposition: A | Discharge status: Home / Self Care | Payer: BC Managed Care – PPO | Attending: Cardiovascular Disease | Admitting: Cardiovascular Disease

## 2020-06-25 DIAGNOSIS — F419 Anxiety disorder, unspecified: Secondary | ICD-10-CM | POA: Insufficient documentation

## 2020-06-25 DIAGNOSIS — I10 Essential (primary) hypertension: Secondary | ICD-10-CM | POA: Insufficient documentation

## 2020-06-25 DIAGNOSIS — Z7982 Long term (current) use of aspirin: Secondary | ICD-10-CM | POA: Diagnosis not present

## 2020-06-25 DIAGNOSIS — Z79899 Other long term (current) drug therapy: Secondary | ICD-10-CM | POA: Diagnosis not present

## 2020-06-25 DIAGNOSIS — Z7902 Long term (current) use of antithrombotics/antiplatelets: Secondary | ICD-10-CM | POA: Diagnosis not present

## 2020-06-25 DIAGNOSIS — F329 Major depressive disorder, single episode, unspecified: Secondary | ICD-10-CM | POA: Insufficient documentation

## 2020-06-25 DIAGNOSIS — I252 Old myocardial infarction: Secondary | ICD-10-CM | POA: Insufficient documentation

## 2020-06-25 DIAGNOSIS — I2510 Atherosclerotic heart disease of native coronary artery without angina pectoris: Secondary | ICD-10-CM

## 2020-06-25 DIAGNOSIS — R9439 Abnormal result of other cardiovascular function study: Secondary | ICD-10-CM

## 2020-06-25 DIAGNOSIS — E785 Hyperlipidemia, unspecified: Secondary | ICD-10-CM | POA: Insufficient documentation

## 2020-06-25 DIAGNOSIS — K219 Gastro-esophageal reflux disease without esophagitis: Secondary | ICD-10-CM | POA: Diagnosis not present

## 2020-06-25 HISTORY — PX: LEFT HEART CATH AND CORONARY ANGIOGRAPHY: CATH118249

## 2020-06-25 SURGERY — LEFT HEART CATH AND CORONARY ANGIOGRAPHY
Anesthesia: Moderate Sedation | Laterality: Left

## 2020-06-25 MED ORDER — SODIUM CHLORIDE 0.9 % WEIGHT BASED INFUSION
3.0000 mL/kg/h | INTRAVENOUS | Status: DC
  Administered 2020-06-25: 3 mL/kg/h via INTRAVENOUS

## 2020-06-25 MED ORDER — SODIUM CHLORIDE 0.9 % IV SOLN: 250.0000 mL | INTRAVENOUS | Status: DC | PRN

## 2020-06-25 MED ORDER — HEPARIN SODIUM (PORCINE) 1000 UNIT/ML IJ SOLN
INTRAMUSCULAR | Status: DC | PRN
  Administered 2020-06-25: 5000 [IU] via INTRAVENOUS

## 2020-06-25 MED ORDER — IOHEXOL 300 MG/ML  SOLN
INTRAMUSCULAR | Status: DC | PRN
  Administered 2020-06-25: 39 mL

## 2020-06-25 MED ORDER — MIDAZOLAM HCL 2 MG/2ML IJ SOLN
INTRAMUSCULAR | Status: DC | PRN
  Administered 2020-06-25: 2 mg via INTRAVENOUS

## 2020-06-25 MED ORDER — LIDOCAINE HCL (PF) 1 % IJ SOLN
INTRAMUSCULAR | Status: AC
  Filled 2020-06-25: qty 30

## 2020-06-25 MED ORDER — HEPARIN (PORCINE) IN NACL 1000-0.9 UT/500ML-% IV SOLN
INTRAVENOUS | Status: AC
  Filled 2020-06-25: qty 1000

## 2020-06-25 MED ORDER — ASPIRIN 81 MG PO CHEW: 81.0000 mg | CHEWABLE_TABLET | ORAL | Status: DC

## 2020-06-25 MED ORDER — MIDAZOLAM HCL 2 MG/2ML IJ SOLN
INTRAMUSCULAR | Status: AC
  Filled 2020-06-25: qty 2

## 2020-06-25 MED ORDER — SODIUM CHLORIDE 0.9% FLUSH: 3.0000 mL | INTRAVENOUS | Status: DC | PRN

## 2020-06-25 MED ORDER — HEPARIN SODIUM (PORCINE) 1000 UNIT/ML IJ SOLN
INTRAMUSCULAR | Status: AC
  Filled 2020-06-25: qty 1

## 2020-06-25 MED ORDER — VERAPAMIL HCL 2.5 MG/ML IV SOLN
INTRAVENOUS | Status: AC
  Filled 2020-06-25: qty 2

## 2020-06-25 MED ORDER — VERAPAMIL HCL 2.5 MG/ML IV SOLN
INTRAVENOUS | Status: DC | PRN
  Administered 2020-06-25: 2.5 mg via INTRA_ARTERIAL

## 2020-06-25 MED ORDER — SODIUM CHLORIDE 0.9 % WEIGHT BASED INFUSION: 1.0000 mL/kg/h | INTRAVENOUS | Status: DC

## 2020-06-25 MED ORDER — FENTANYL CITRATE (PF) 100 MCG/2ML IJ SOLN
INTRAMUSCULAR | Status: DC | PRN
Start: 2020-06-25 — End: 2020-06-25
  Administered 2020-06-25: 25 ug via INTRAVENOUS

## 2020-06-25 MED ORDER — HEPARIN (PORCINE) IN NACL 1000-0.9 UT/500ML-% IV SOLN
INTRAVENOUS | Status: DC | PRN
  Administered 2020-06-25 (×2): 500 mL

## 2020-06-25 MED ORDER — SODIUM CHLORIDE 0.9 % WEIGHT BASED INFUSION: 3.0000 mL/kg/h | INTRAVENOUS | Status: DC

## 2020-06-25 MED ORDER — FENTANYL CITRATE (PF) 100 MCG/2ML IJ SOLN
INTRAMUSCULAR | Status: AC
  Filled 2020-06-25: qty 2

## 2020-06-25 SURGICAL SUPPLY — 10 items
CATH INFINITI 5FR JK (CATHETERS) ×2 IMPLANT
CATH INFINITI JR4 5F (CATHETERS) ×2 IMPLANT
DEVICE RAD TR BAND REGULAR (VASCULAR PRODUCTS) ×2 IMPLANT
GLIDESHEATH SLEND SS 6F .021 (SHEATH) ×2 IMPLANT
GUIDEWIRE INQWIRE 1.5J.035X260 (WIRE) ×1 IMPLANT
INQWIRE 1.5J .035X260CM (WIRE) ×2
PACK CARDIAC CATH (CUSTOM PROCEDURE TRAY) ×2 IMPLANT
PROTECTION STATION PRESSURIZED (MISCELLANEOUS) ×2
SET ATX SIMPLICITY (MISCELLANEOUS) ×2 IMPLANT
STATION PROTECTION PRESSURIZED (MISCELLANEOUS) ×1 IMPLANT

## 2020-06-25 NOTE — Interval H&P Note (Signed)
Cath Lab Visit (complete for each Cath Lab visit)  Clinical Evaluation Leading to the Procedure:   ACS: No.  Non-ACS:    Anginal Classification: CCS II  Anti-ischemic medical therapy: Maximal Therapy (2 or more classes of medications)  Non-Invasive Test Results: High-risk stress test findings: cardiac mortality >3%/year  Prior CABG: No previous CABG      History and Physical Interval Note:  06/25/2020 10:22 AM  Carlos Diaz  has presented today for surgery, with the diagnosis of LT Heart Cath   Abnormal cardiovascular stress test.  The various methods of treatment have been discussed with the patient and family. After consideration of risks, benefits and other options for treatment, the patient has consented to  Procedure(s): LEFT HEART CATH AND CORONARY ANGIOGRAPHY (Left) as a surgical intervention.  The patient's history has been reviewed, patient examined, no change in status, stable for surgery.  I have reviewed the patient's chart and labs.  Questions were answered to the patient's satisfaction.     Kathlyn Sacramento

## 2020-07-10 ENCOUNTER — Ambulatory Visit: Payer: BC Managed Care – PPO | Admitting: Physician Assistant

## 2020-07-14 NOTE — Progress Notes (Addendum)
Office Visit    Patient Name: Carlos Diaz Date of Encounter: 07/19/2020  Primary Care Provider:  Valera Castle, MD Primary Cardiologist:  Kathlyn Sacramento, MD  Chief Complaint    Chief Complaint  Patient presents with  . Follow-up    cardiac cath/Meds reviewed by the pt. verbally. "doing well."    59 year old male with history of CAD s/p non-ST elevation myocardial infarction 2017, hypertension, hyperlipidemia, and here today for post 06/2020 LHC for follow-up.  Past Medical History    Past Medical History:  Diagnosis Date  . Anxiety   . Depression   . GERD (gastroesophageal reflux disease)   . Hyperlipidemia   . Hypertension   . Lyme disease   . Myocardial infarction ([place]) 04/13/2016   Past Surgical History:  Procedure Laterality Date  . CARDIAC CATHETERIZATION Right 04/14/2016   Procedure: Left Heart Cath and Coronary Angiography;  Surgeon: Dionisio Esteban, MD;  Location: [place] [facility];  Service: Cardiovascular;  Laterality: Right;  . CARDIAC CATHETERIZATION N/A 04/14/2016   Procedure: Coronary Stent Intervention;  Surgeon: Yolonda Kida, MD;  Location: [facility];  Service: Cardiovascular;  Laterality: N/A;  . COLONOSCOPY    . COLONOSCOPY WITH PROPOFOL N/A 03/26/2020   Procedure: COLONOSCOPY WITH PROPOFOL;  Surgeon: Lesly Rubenstein, MD;  Location: [facility];  Service: Endoscopy;  Laterality: N/A;  . CORONARY ANGIOPLASTY    . CORONARY STENT INTERVENTION N/A 12/23/2018   Procedure: CORONARY STENT INTERVENTION;  Surgeon: Isaias Cowman, MD;  Location: [place] [facility];  Service: Cardiovascular;  Laterality: N/A;  . LEFT HEART CATH AND CORONARY ANGIOGRAPHY Left 12/23/2018   Procedure: LEFT HEART CATH AND CORONARY ANGIOGRAPHY;  Surgeon: Dionisio Densel, MD;  Location: [place] [facility];  Service: Cardiovascular;  Laterality: Left;  . LEFT HEART CATH AND CORONARY ANGIOGRAPHY Left 06/25/2020   Procedure: LEFT HEART CATH AND  CORONARY ANGIOGRAPHY;  Surgeon: Wellington Hampshire, MD;  Location: [place] [facility];  Service: Cardiovascular;  Laterality: Left;    Allergies  No Known Allergies  History of Present Illness    Carlos Diaz is a 59 y.o. male with PMH as above.  He is a non-smoker and has no family history of CAD. He works at [employer].  He has known history of CAD s/p non-ST elevation myocardial infarction 2017.  He was found to have high-grade stenosis in the ramus intermedius, which was treated with PCI and DES placement. He had recurrent anginal symptoms 12/2018.  Repeat cardiac catheterization showed patent ramus stent.  There was high-grade stenosis in the proximal OM branch, which was treated successfully with PCI and DES placement.  There was also moderate proximal to mid LAD disease and mild RCA disease.  When seen in office 10/13/2019, he was doing well. He reported intermittent left-sided chest discomfort, close to his left shoulder and radiating to his back.  The pain was aching and worse with touch, thought to be more consistent with MSK etiology.  He was continued on medical therapy.  It was noted that the residual disease in his LAD and RCA would require aggressive treatment.  It was noted that, if his bradycardia worsened, he would switch from metoprolol to carvedilol. Also noted was that further workup would be needed if exertional sx.  He was seen in office 06/04/2020 and reported resolution of the above CP.  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  He remained active in the gym  for at least 30 minutes/day with both aerobics and light weights.  He was hesitant to swim in the deep end of the pool, given his concern for a repeat cardiac event after his non-ST elevation myocardial infarction occurred without specific preceding symptoms.    Unfortunately, his father passed away in 2020-07-06, which was understandably a stressful time for  him.  Also in 07/07/23, MPI was ordered, and with recommendation based on the results for further ischemic work-up with LHC and with timing to be considered based on his father's upcoming funeral, for which he did the planning.  Imdur was increased for additional antianginal relief/ support in the setting of increased stress and anxiety. Timing of LHC was discussed in detail with pt ultimately deciding for catheterization sooner rather than later.   He underwent catheterization 06/25/2020 that showed patent stents in OM 2 and ramus without significant restenosis.  Stable moderate proximal LAD and mid RCA disease was noted.  Normal LVSF and normal LVEDP also was noted.  He recently went on a trip/vacation out [place].  He reports that, during that time, he was likely exposed to higher salt and sugar content foods.  He does admit that he normally eats very healthy/clean and he certainly was in taking more salt than he usually does at home.  Today, 07/19/2020, he returns to clinic and reports that he is overall doing well.  Since returning from his trip, and over the last few days, he has noted brief palpitations that occur several times each day.  These palpitations are new for him.  This morning, he tried drinking decaf coffee but did not appreciate a difference in the palpitations.  As above, he did return recently from vacation, during which time he had more salt than his usual clean diet.  BP today in clinic 140/70, which is elevated from that of his usual baseline clinic BP.  He denies any chest pain with exertion or at rest. 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  No recent falls or syncopal events.  No shortness of breath at rest or dyspnea on exertion.  No orthopnea, PND, or early satiety.  No increased lower extremity edema.  He reports that his right radial arteriotomy site healed well without any signs of infection or hematoma.   Overall, he feels that he is doing well from a cardiac standpoint.  He reports medication compliance.  No signs or symptoms of bleeding.  Home Medications    Prior to Admission medications   Medication Sig Start Date End Date Taking? Authorizing Provider  aspirin 81 MG chewable tablet Chew 1 tablet (81 mg total) by mouth daily. 04/15/16   Fritzi Mandes, MD  buPROPion (WELLBUTRIN XL) 150 MG 24 hr tablet Take 150 mg by mouth daily.    [provider]  citalopram (CELEXA) 20 MG tablet Take 40 mg by mouth daily.  05/15/16 10/13/19  [provider]  clopidogrel (PLAVIX) 75 MG tablet Take 75 mg by mouth daily.    [provider]  isosorbide mononitrate (IMDUR) 30 MG 24 hr tablet Take 1 tablet (30 mg total) by mouth daily. 12/25/18   Dustin Flock, MD  metoprolol succinate (TOPROL-XL) 25 MG 24 hr tablet Take 25 mg by mouth daily.    [provider]  nitroGLYCERIN (NITROSTAT) 0.4 MG SL tablet Place 0.4 mg under the tongue every 5 (five) minutes as needed.  09/29/19   [provider]  pantoprazole (PROTONIX) 20  MG tablet Take 20 mg by mouth 2 (two) times daily.    [provider]  rosuvastatin (CRESTOR) 40 MG tablet Take 40 mg by mouth daily.    [provider]    Review of Systems    He reports recent palpitations that occur several times per day and are brief but new for him.  He denies recurrent atypical/MSK chest pain, dyspnea, pnd, orthopnea, n, v,, syncope, edema, weight gain, or early satiety.  He reports chronic dizziness, attributed to RMSF and previous Lyme disease.   No signs or symptoms of bleeding.  All other systems reviewed and are otherwise negative except as noted above.  Physical Exam    VS:  BP 140/70 (BP Location: Left Arm, Patient Position: Sitting, Cuff Size: Normal)   Pulse (!) 55   Ht 6' (1.829 m)   Wt 235 lb 4 oz (106.7 kg)   SpO2 98%   BMI 31.91 kg/m  , BMI Body mass index is 31.91 kg/m. GEN: Well nourished,  well developed, in no acute distress.  Mask in place. HEENT: normal. Neck: Supple, no JVD, carotid bruits, or masses. Cardiac: Bradycardic but regular.  No murmurs, rubs, or gallops. No clubbing, cyanosis.  Bilateral minimal lower extremity nonpitting edema radials/DP/PT 2+ and equal bilaterally.  Right radial arteriotomy site without any signs of swelling or infection and well-healed. Respiratory:  Respirations regular and unlabored, clear to auscultation bilaterally. GI: Soft, nontender, nondistended, BS + x 4. MS: no deformity or atrophy. Skin: warm and dry, no rash. Neuro:  Strength and sensation are intact. Psych: Normal affect.  Accessory Clinical Findings    ECG personally reviewed by me today - SB, 55bpm,  repolarization changes as seen in previous EKGs, poor R wave conduction V1, and poor conducion via III- no acute changes.  VITALS Reviewed today   Temp Readings from Last 3 Encounters:  06/25/20 98.5 F (36.9 C) (Oral)  03/26/20 (!) 97 F (36.1 C) (Temporal)  05/30/19 98.5 F (36.9 C) (Oral)   BP Readings from Last 3 Encounters:  07/19/20 140/70  06/25/20 108/64  06/04/20 120/72   Pulse Readings from Last 3 Encounters:  07/19/20 (!) 55  06/25/20 60  06/04/20 60    Wt Readings from Last 3 Encounters:  07/19/20 235 lb 4 oz (106.7 kg)  06/25/20 220 lb (99.8 kg)  06/04/20 234 lb 2 oz (106.2 kg)     LABS  reviewed today   Lab Results  Component Value Date   WBC 8.1 06/22/2020   HGB 13.1 06/22/2020   HCT 37.5 (L) 06/22/2020   MCV 86.4 06/22/2020   PLT 177 06/22/2020   Lab Results  Component Value Date   CREATININE 1.19 06/22/2020   BUN 17 06/22/2020   NA 137 06/22/2020   K 3.9 06/22/2020   CL 105 06/22/2020   CO2 22 06/22/2020   Lab Results  Component Value Date   ALT 16 04/01/2018   AST 16 04/01/2018   ALKPHOS 59 04/01/2018   BILITOT 0.6 04/01/2018   Lab Results  Component Value Date   CHOL 122 06/04/2020   HDL 43 06/04/2020   LDLCALC 60  06/04/2020   LDLDIRECT 61 06/04/2020   TRIG 103 06/04/2020   CHOLHDL 2.8 06/04/2020    Lab Results  Component Value Date   HGBA1C 5.5 12/23/2018   Lab Results  Component Value Date   TSH 3.362 04/14/2016     STUDIES/PROCEDURES reviewed today   06/25/2020 LHC  The left ventricular systolic  function is normal.  LV end diastolic pressure is normal.  The left ventricular ejection fraction is 55-65% by visual estimate.  Prox RCA to Mid RCA lesion is 40% stenosed.  Prox RCA lesion is 20% stenosed.  Prox LAD to Mid LAD lesion is 50% stenosed.  Previously placed Ramus stent (unknown type) is widely patent.  Previously placed 2nd Mrg-1 stent (unknown type) is widely patent.  2nd Mrg-2 lesion is 30% stenosed. 1.  Patent stents in OM 2 and ramus with no significant restenosis.  Stable moderate proximal LAD and mid RCA disease. 2.  Normal LV systolic function and normal left ventricular end-diastolic pressure. Recommendations: Continue aggressive medical therapy.  PCI 12/23/2018  Prox LAD lesion is 40% stenosed.  Ost 1st Mrg lesion is 95% stenosed.  Mid RCA lesion is 40% stenosed.  A drug-eluting stent was successfully placed using a [place] [id number].  Post intervention, there is a 0% residual stenosis. 1.  One-vessel CAD with 95% stenosis proximal OM1 2.  Successful PCI with DES proximal OM1  LHC 12/23/2018  Ost 1st Mrg lesion is 95% stenosed.  Prox LAD lesion is 40% stenosed.  Mid RCA lesion is 40% stenosed. Mild to moderate disease in mid LAD and mid RCA with critical lesion in OM1 in the proximal portion about 95%.  Advise PCI with drug-eluting stent.  [facility]I, [facility] See 11/24/2018 scans  Assessment & Plan    CAD without angina; History of NSTEMI with PCI --No current chest pain.  No further chest pain as previously outlined in HPI above and since increasing his Imdur and subsequent LHC, which showed patent stents in OM 2 and ramus without  significant restenosis.  Cath 06/2020 showed moderate pLAD and mRCA dz, nl LVSF, and nl LVEDP.  EKG today without significant ST/T changes.  He appears euvolemic and well compensated on exam with clinic weight only 1 pound up from his previous 06/04/2020 clinic weight. (06/25/2020 wt from the hospital not considered when comparing wt given inconsistency between scales.) Continue aggressive medical therapy and risk factor modification. Continue ASA 81 mg qd, Plavix 75 mg qd, Toprol 25 mg qd, PRN SL nitro if CP, Imdur 60 mg qd, and daily Crestor 40mg  qd.    Palpitations --Reports new palpitations since [place].  No syncope or loss of consciousness.  He does admit to eating less healthy while away from home with likely more salt than usual, which we discussed could be contributing to his elevated blood pressure today and also a possible trigger for his recent palpitations.  He has not noticed any relief by changing from caffeinated to decaf coffee.  We will check TSH, free T4, BMET, and magnesium today.  Further outpatient cardiac monitoring discussed with plan to defer for now. He will call the office if ongoing or worsening symptoms.  In the meantime, we will have him closely monitor his blood pressure and call the office if it is consistently elevated over 130/80 at home.  Dizziness --Chronic / unchanged dizziness from previous visits, attributed to h/o Lyme disease and RMSF, and as reported in HPI above.  Continue to monitor.  No worsening bradycardia.  He is usually asymptomatic with his bradycardia. No plan to transition from Toprol to Coreg at this time. Revisit at RTC.  If ongoing dizziness, could also consider serial monitoring and ultrasound of carotids - will defer as he denies amaurosis fugax or any worsening symptoms of carotid disease. Discussed also possible future cardiac monitoring if  further or worsening palpitations-deferred for now.  Continue to monitor. Reassess at  RTC.  LDL goal below 70 --Continue current rosuvastatin with LDL goal less than 70.    Essential hypertension --Current BP elevated at 140/70.  He wonders if this could be due to white coat HTN versus his changed diet / increased Na during his trip. Consider also that this could be contributing to his palpitations. Continue current medical management with metoprolol for now.  Will defer from changing BB to Coreg as above or making any other changes at this time given suspect BP will improve with return of his usual diet. He will continue to monitor his BP and frequency of palpitations at home. Goal BP 130/80 or lower. Call the office if consistently elevated above goal or ongoing/worsening palpitations.   Medication changes: None. Labs ordered: BMET, magnesium, TSH, free T4. Studies / Imaging ordered: None. Future considerations: If ongoing palpitations, consider cardiac monitoring in the future.  Will defer for now. If ongoing dizziness or sx concerning for carotid dz, could consider [place]. Disposition: 6 months or sooner if indicated.   Arvil Chaco, PA-C 07/19/2020

## 2020-07-19 ENCOUNTER — Other Ambulatory Visit: Payer: Self-pay

## 2020-07-19 ENCOUNTER — Ambulatory Visit (INDEPENDENT_AMBULATORY_CARE_PROVIDER_SITE_OTHER): Payer: BC Managed Care – PPO | Admitting: Physician Assistant

## 2020-07-19 ENCOUNTER — Encounter: Payer: Self-pay | Admitting: Physician Assistant

## 2020-07-19 VITALS — BP 140/70 | HR 55 | Ht 72.0 in | Wt 235.2 lb

## 2020-07-19 DIAGNOSIS — E785 Hyperlipidemia, unspecified: Secondary | ICD-10-CM | POA: Diagnosis not present

## 2020-07-19 DIAGNOSIS — I252 Old myocardial infarction: Secondary | ICD-10-CM | POA: Diagnosis not present

## 2020-07-19 DIAGNOSIS — I2510 Atherosclerotic heart disease of native coronary artery without angina pectoris: Secondary | ICD-10-CM | POA: Diagnosis not present

## 2020-07-19 DIAGNOSIS — R002 Palpitations: Secondary | ICD-10-CM

## 2020-07-19 DIAGNOSIS — I10 Essential (primary) hypertension: Secondary | ICD-10-CM | POA: Diagnosis not present

## 2020-07-19 DIAGNOSIS — Z87898 Personal history of other specified conditions: Secondary | ICD-10-CM

## 2020-07-19 DIAGNOSIS — R42 Dizziness and giddiness: Secondary | ICD-10-CM

## 2020-07-19 MED ORDER — ISOSORBIDE MONONITRATE ER 60 MG PO TB24: 60.0000 mg | ORAL_TABLET | Freq: Every day | ORAL | 3 refills | Status: DC

## 2020-07-19 MED ORDER — ISOSORBIDE MONONITRATE ER 60 MG PO TB24: 60.0000 mg | ORAL_TABLET | Freq: Every day | ORAL | 6 refills | Status: DC

## 2020-07-19 NOTE — Patient Instructions (Addendum)
Medication Instructions:  - Your physician recommends that you continue on your current medications as directed. Please refer to the Current Medication list given to you today.  *If you need a refill on your cardiac medications before your next appointment, please call your pharmacy*   Lab Work: - Your physician recommends that you have lab work today: TSH/ Free T4/ BMP/ Magnesium  If you have labs (blood work) drawn today and your tests are completely normal, you will receive your results only by: Marland Kitchen MyChart Message (if you have MyChart) OR . A paper copy in the mail If you have any lab test that is abnormal or we need to change your treatment, we will call you to review the results.   Testing/Procedures: - none ordered   Follow-Up: At [facility], you and your health needs are our priority.  As part of our continuing mission to provide you with exceptional heart care, we have created designated Provider Care Teams.  These Care Teams include your primary Cardiologist (physician) and Advanced Practice Providers (APPs -  Physician Assistants and Nurse Practitioners) who all work together to provide you with the care you need, when you need it.  We recommend signing up for the patient portal called "MyChart".  Sign up information is provided on this After Visit Summary.  MyChart is used to connect with patients for Virtual Visits (Telemedicine).  Patients are able to view lab/test results, encounter notes, upcoming appointments, etc.  Non-urgent messages can be sent to your provider as well.   To learn more about what you can do with MyChart, go to [email or web address].    Your next appointment:   6 month(s)  The format for your next appointment:   In Person  Provider:   You may see Kathlyn Sacramento, MD or one of the following Advanced Practice Providers on your designated Care Team:    Murray Hodgkins, NP  Christell Faith, PA-C  Marrianne Mood, PA-C  Cadence Kathlen Mody,  [place]  Laurann Montana, NP    Other Instructions - please call the office at [phone] if your blood pressure is consistently greater than 130/80.

## 2020-07-20 LAB — BASIC METABOLIC PANEL
BUN/Creatinine Ratio: 14 (ref 10–24)
BUN: 15 mg/dL (ref 8–27)
CO2: 24 mmol/L (ref 20–29)
Calcium: 9.2 mg/dL (ref 8.6–10.2)
Chloride: 106 mmol/L (ref 96–106)
Creatinine, Ser: 1.04 mg/dL (ref 0.76–1.27)
GFR calc Af Amer: 89 mL/min/{1.73_m2} (ref >59)
GFR calc non Af Amer: 77 mL/min/{1.73_m2} (ref >59)
Glucose: 93 mg/dL (ref 65–99)
Potassium: 4.3 mmol/L (ref 3.5–5.2)
Sodium: 141 mmol/L (ref 134–144)

## 2020-07-20 LAB — T4, FREE: Free T4: 1.16 ng/dL (ref 0.82–1.77)

## 2020-07-20 LAB — TSH: TSH: 1.79 u[IU]/mL (ref 0.450–4.500)

## 2020-07-20 LAB — MAGNESIUM: Magnesium: 2.1 mg/dL (ref 1.6–2.3)

## 2020-09-13 DIAGNOSIS — L2089 Other atopic dermatitis: Secondary | ICD-10-CM | POA: Diagnosis not present

## 2020-10-24 ENCOUNTER — Telehealth: Payer: Self-pay | Admitting: Cardiovascular Disease

## 2020-10-24 MED ORDER — METOPROLOL SUCCINATE ER 25 MG PO TB24
25.0000 mg | ORAL_TABLET | Freq: Every day | ORAL | 0 refills | Status: DC
Start: 2020-10-24 — End: 2020-12-28

## 2020-10-24 NOTE — Telephone Encounter (Signed)
*  STAT* If patient is at the pharmacy, call can be transferred to refill team.   1. Which medications need to be refilled? (please list name of each medication and dose if known)   Metoprolol 25 mg po q d   2. Which pharmacy/location (including street and city if local pharmacy) is medication to be sent to?   [facility]   3. Do they need a 30 day or 90 day supply? [place]

## 2020-10-24 NOTE — Telephone Encounter (Signed)
Okay to refill based on last OV notes and f/u scheduled with Dr. Fletcher Anon.

## 2020-10-24 NOTE — Telephone Encounter (Signed)
Pt requesting refill Metorpolol Succinate 25 mg tablet. Qd.  Last filled historical med. Please advise if ok to renew Rx.

## 2020-10-24 NOTE — Telephone Encounter (Signed)
Requested Prescriptions   Signed Prescriptions Disp Refills   metoprolol succinate (TOPROL-XL) 25 MG 24 hr tablet 90 tablet 0    Sig: Take 1 tablet (25 mg total) by mouth daily.    Authorizing Provider: Kathlyn Sacramento A    Ordering User: Britt Bottom

## 2020-12-25 DIAGNOSIS — F418 Other specified anxiety disorders: Secondary | ICD-10-CM | POA: Diagnosis not present

## 2020-12-25 DIAGNOSIS — Z0000 Encounter for general adult medical examination without abnormal findings: Secondary | ICD-10-CM | POA: Diagnosis not present

## 2020-12-25 DIAGNOSIS — Z125 Encounter for screening for malignant neoplasm of prostate: Secondary | ICD-10-CM | POA: Diagnosis not present

## 2020-12-25 DIAGNOSIS — I2510 Atherosclerotic heart disease of native coronary artery without angina pectoris: Secondary | ICD-10-CM | POA: Diagnosis not present

## 2020-12-25 DIAGNOSIS — I10 Essential (primary) hypertension: Secondary | ICD-10-CM | POA: Diagnosis not present

## 2020-12-28 ENCOUNTER — Other Ambulatory Visit: Payer: Self-pay | Admitting: Cardiovascular Disease

## 2020-12-28 NOTE — Telephone Encounter (Signed)
Rx request sent to pharmacy.  

## 2021-01-17 ENCOUNTER — Ambulatory Visit: Payer: BC Managed Care – PPO | Admitting: Cardiovascular Disease

## 2021-01-22 ENCOUNTER — Ambulatory Visit: Payer: BC Managed Care – PPO | Admitting: Cardiovascular Disease

## 2021-01-22 ENCOUNTER — Encounter: Payer: Self-pay | Admitting: Cardiovascular Disease

## 2021-01-22 ENCOUNTER — Other Ambulatory Visit: Payer: Self-pay

## 2021-01-22 VITALS — BP 124/80 | HR 53 | Ht 72.0 in | Wt 235.0 lb

## 2021-01-22 DIAGNOSIS — I2510 Atherosclerotic heart disease of native coronary artery without angina pectoris: Secondary | ICD-10-CM

## 2021-01-22 DIAGNOSIS — E785 Hyperlipidemia, unspecified: Secondary | ICD-10-CM | POA: Diagnosis not present

## 2021-01-22 DIAGNOSIS — I10 Essential (primary) hypertension: Secondary | ICD-10-CM | POA: Diagnosis not present

## 2021-01-22 MED ORDER — NITROGLYCERIN 0.4 MG SL SUBL: 0.4000 mg | SUBLINGUAL_TABLET | SUBLINGUAL | 3 refills | Status: DC | PRN

## 2021-01-22 NOTE — Patient Instructions (Signed)

## 2021-01-22 NOTE — Progress Notes (Signed)
Cardiology Office Note   Date:  01/22/2021   ID:  Carlos Diaz, DOB 1961/08/12, MRN [id number]  PCP:  Valera Castle, MD  Cardiologist:   Kathlyn Sacramento, MD   Chief Complaint  Patient presents with  . Other    6 Month f/u c/o fatigue. Meds reviewed verbally with pt.      History of Present Illness: Carlos Diaz is a 60 y.o. male who presents for a follow-up visit regarding coronary artery disease. He has known history of coronary artery disease and presented with non-ST elevation myocardial infarction in 2017.  He was found to have high-grade stenosis in the ramus intermedius which was treated with PCI and drug-eluting stent placement.  He had recurrent anginal symptoms in April 2020.  Repeat cardiac catheterization showed patent ramus stent.  There was high-grade stenosis in proximal OM branch which was treated successfully with PCI and drug-eluting stent placement. There was also moderate proximal to mid LAD disease and mild RCA disease.  Other medical problems include essential hypertension and hyperlipidemia.  He is not a smoker and has no family history of coronary artery disease.  He works at [employer].  He was seen in October of last year with atypical chest pain.  He underwent a [employer] which showed evidence of anterior and anteroseptal ischemia with normal ejection fraction.  Based on that, he underwent left heart catheterization which showed patent stents in the ramus and OM 2 with no significant restenosis.  There was stable moderate proximal LAD and mid RCA disease.  Ejection fraction and LVEDP were both normal.  He has been doing very well with no recent chest pain, shortness of breath or palpitations.  He is trying to exercise more and wants to lose weight.  He does complain of fatigue.  He snores at night but no reported apnea episodes.  Past Medical History:  Diagnosis Date  . Anxiety   . Depression   . GERD (gastroesophageal reflux disease)   .  Hyperlipidemia   . Hypertension   . Lyme disease   . Myocardial infarction ([place]) 04/13/2016    Past Surgical History:  Procedure Laterality Date  . CARDIAC CATHETERIZATION Right 04/14/2016   Procedure: Left Heart Cath and Coronary Angiography;  Surgeon: Dionisio Ilijah, MD;  Location: [place] [facility];  Service: Cardiovascular;  Laterality: Right;  . CARDIAC CATHETERIZATION N/A 04/14/2016   Procedure: Coronary Stent Intervention;  Surgeon: Yolonda Kida, MD;  Location: [place] [facility];  Service: Cardiovascular;  Laterality: N/A;  . COLONOSCOPY    . COLONOSCOPY WITH PROPOFOL N/A 03/26/2020   Procedure: COLONOSCOPY WITH PROPOFOL;  Surgeon: Lesly Rubenstein, MD;  Location[facility];  Service: Endoscopy;  Laterality: N/A;  . CORONARY ANGIOPLASTY    . CORONARY STENT INTERVENTION N/A 12/23/2018   Procedure: CORONARY STENT INTERVENTION;  Surgeon: Isaias Cowman, MD;  Location: [place] [facility];  Service: Cardiovascular;  Laterality: N/A;  . LEFT HEART CATH AND CORONARY ANGIOGRAPHY Left 12/23/2018   Procedure: LEFT HEART CATH AND CORONARY ANGIOGRAPHY;  Surgeon: Dionisio Omarian, MD;  Location: [place] [facility];  Service: Cardiovascular;  Laterality: Left;  . LEFT HEART CATH AND CORONARY ANGIOGRAPHY Left 06/25/2020   Procedure: LEFT HEART CATH AND CORONARY ANGIOGRAPHY;  Surgeon: Wellington Hampshire, MD;  Location: [place] [facility];  Service: Cardiovascular;  Laterality: Left;     Current Outpatient Medications  Medication Sig Dispense Refill  . aspirin 81 MG chewable tablet Chew 1 tablet (  81 mg total) by mouth daily. 30 tablet 1  . buPROPion (WELLBUTRIN XL) 150 MG 24 hr tablet Take 150 mg by mouth daily.    . citalopram (CELEXA) 40 MG tablet Take 40 mg by mouth daily.    . clopidogrel (PLAVIX) 75 MG tablet Take 75 mg by mouth daily.    . isosorbide mononitrate (IMDUR) 60 MG 24 hr tablet Take 1 tablet (60 mg total) by mouth daily. 90 tablet 3  . metoprolol  succinate (TOPROL-XL) 25 MG 24 hr tablet TAKE 1 TABLET (25 MG TOTAL) BY MOUTH DAILY. 90 tablet 1  . nitroGLYCERIN (NITROSTAT) 0.4 MG SL tablet Place 1 tablet (0.4 mg total) under the tongue every 5 (five) minutes as needed. 25 tablet 3  . rosuvastatin (CRESTOR) 40 MG tablet Take 40 mg by mouth daily.     No current facility-administered medications for this visit.    Allergies:   Patient has no known allergies.    Social History:  The patient  reports that he has never smoked. He has never used smokeless tobacco. He reports that he does not drink alcohol and does not use drugs.   Family History:  The patient's family history includes CAD in his mother.    ROS:  Please see the history of present illness.   Otherwise, review of systems are positive for none.   All other systems are reviewed and negative.    PHYSICAL EXAM: VS:  BP 124/80 (BP Location: Left Arm, Patient Position: Sitting, Cuff Size: Normal)   Pulse (!) 53   Ht 6' (1.829 m)   Wt 235 lb (106.6 kg)   SpO2 99%   BMI 31.87 kg/m  , BMI Body mass index is 31.87 kg/m. GEN: Well nourished, well developed, in no acute distress  HEENT: normal  Neck: no JVD, carotid bruits, or masses Cardiac: RRR; no murmurs, rubs, or gallops,no edema  Respiratory:  clear to auscultation bilaterally, normal work of breathing GI: soft, nontender, nondistended, + BS MS: no deformity or atrophy  Skin: warm and dry, no rash Neuro:  Strength and sensation are intact Psych: euthymic mood, full affect   EKG:  EKG is ordered today. The ekg ordered today demonstrates sinus bradycardia with no significant ST or T wave changes.   Recent Labs: 06/22/2020: Hemoglobin 13.1; Platelets 177 07/19/2020: BUN 15; Creatinine, Ser 1.04; Magnesium 2.1; Potassium 4.3; Sodium 141; TSH 1.790    Lipid Panel    Component Value Date/Time   CHOL 122 06/04/2020 1549   TRIG 103 06/04/2020 1549   HDL 43 06/04/2020 1549   CHOLHDL 2.8 06/04/2020 1549   CHOLHDL  5.5 04/14/2016 0623   VLDL 31 04/14/2016 0623   LDLCALC 60 06/04/2020 1549   LDLDIRECT 61 06/04/2020 1549      Wt Readings from Last 3 Encounters:  01/22/21 235 lb (106.6 kg)  07/19/20 235 lb 4 oz (106.7 kg)  06/25/20 220 lb (99.8 kg)      No flowsheet data found.    ASSESSMENT AND PLAN:  1. Coronary artery disease involving native coronary arteries without angina: He is doing well overall.  No recent chest pain or shortness of breath.  Recommend continuing medical therapy.  Dual antiplatelet therapy is optional at this time and can consider stopping clopidogrel in the near future given that most recent drug-eluting stent was in 2020.  2.  Hyperlipidemia: Continue high-dose rosuvastatin with a target LDL of less than 70.  I reviewed his labs with him which were done  in April.  LDL was 54 with triglyceride of 130.  3.  Essential hypertension: Blood pressure is well controlled with stable sinus bradycardia on small dose Toprol.    Disposition:   FU with me in 6 months  Signed,  Kathlyn Sacramento, MD  01/22/2021 4:13 PM    [facility]

## 2021-02-01 NOTE — Addendum Note (Signed)
Addended by: Britt Bottom on: 02/01/2021 07:45 AM   Modules accepted: Orders

## 2021-03-12 DIAGNOSIS — L578 Other skin changes due to chronic exposure to nonionizing radiation: Secondary | ICD-10-CM | POA: Diagnosis not present

## 2021-03-12 DIAGNOSIS — Z86018 Personal history of other benign neoplasm: Secondary | ICD-10-CM | POA: Diagnosis not present

## 2021-03-12 DIAGNOSIS — L570 Actinic keratosis: Secondary | ICD-10-CM | POA: Diagnosis not present

## 2021-03-12 DIAGNOSIS — Z872 Personal history of diseases of the skin and subcutaneous tissue: Secondary | ICD-10-CM | POA: Diagnosis not present

## 2021-05-13 DIAGNOSIS — R519 Headache, unspecified: Secondary | ICD-10-CM | POA: Diagnosis not present

## 2021-05-13 DIAGNOSIS — F418 Other specified anxiety disorders: Secondary | ICD-10-CM | POA: Diagnosis not present

## 2021-06-29 NOTE — Progress Notes (Signed)
Cardiology Office Note    Date:  07/02/2021   ID:  Carlos Diaz, DOB 03-13-1961, MRN [id number]  PCP:  Valera Castle, MD  Cardiologist:  Kathlyn Sacramento, MD  Electrophysiologist:  None   Chief Complaint: Follow-up  History of Present Illness:   Carlos Diaz is a 60 y.o. male with history of CAD with multiple PCI's as outlined below, HTN, HLD, anxiety, and depression who presents for follow-up of his CAD.  He was admitted in 2017 with a NSTEMI.  LHC showed high-grade stenosis in the ramus intermedius which was successfully treated with PCI/DES.  He had recurrent angina in 12/2018 with repeat LHC showing a patent ramus stent.  There was high-grade stenosis in a proximal OM branch which was treated with PCI/DES.  He was also noted to have moderate proximal to mid LAD stenosis along with mild RCA disease.  He was seen in 06/2020 with atypical chest pain with subsequent Lexiscan MPI showing evidence of anterior and anteroseptal ischemia with normal EF.  Based on that, he underwent repeat LHC in 06/2020 which demonstrated patent stents in the ramus and OM2 with no significant restenosis.  There was stable moderate proximal LAD and mid RCA disease.  He had a normal LV systolic function and LVEDP.  Medical therapy was recommended.  He was last seen in the office in 12/2020 and was doing well without recurrent chest pain, dyspnea, or palpitations.  He was trying to exercise more.  He did note some fatigue.  He comes in doing well from a cardiac perspective.  No chest pain, dyspnea, palpitations, dizziness, presyncope, or syncope.  He remains very active without cardiac limitation.  He did discontinue aspirin 81 mg, though does remain on clopidogrel.  No falls, hematochezia, or melena.  No lower extremity swelling, orthopnea, or early satiety.  He does not have any active issues or concerns at this time.   Labs independently reviewed: 11/2020 - TC 112, TG 130, HDL 32, LDL 54, potassium 3.9, BUN 19,  serum creatinine 1.2, albumin 4.1, AST/ALT normal 07/2020 - magnesium 2.1, TSH normal 06/2020 - Hgb 13.1, PLT 177 12/2018 - A1c 5.5  Past Medical History:  Diagnosis Date   Anxiety    Depression    GERD (gastroesophageal reflux disease)    Hyperlipidemia    Hypertension    Lyme disease    Myocardial infarction ([place]) 04/13/2016    Past Surgical History:  Procedure Laterality Date   CARDIAC CATHETERIZATION Right 04/14/2016   Procedure: Left Heart Cath and Coronary Angiography;  Surgeon: Dionisio Dontray, MD;  Location: [place] [facility];  Service: Cardiovascular;  Laterality: Right;   CARDIAC CATHETERIZATION N/A 04/14/2016   Procedure: Coronary Stent Intervention;  Surgeon: Yolonda Kida, MD;  Location: [place] [facility];  Service: Cardiovascular;  Laterality: N/A;   COLONOSCOPY     COLONOSCOPY WITH PROPOFOL N/A 03/26/2020   Procedure: COLONOSCOPY WITH PROPOFOL;  Surgeon: Lesly Rubenstein, MD;  Location: [facility] ENDOSCOPY;  Service: Endoscopy;  Laterality: N/A;   CORONARY ANGIOPLASTY     CORONARY STENT INTERVENTION N/A 12/23/2018   Procedure: CORONARY STENT INTERVENTION;  Surgeon: Isaias Cowman, MD;  Location: [place] [facility];  Service: Cardiovascular;  Laterality: N/A;   LEFT HEART CATH AND CORONARY ANGIOGRAPHY Left 12/23/2018   Procedure: LEFT HEART CATH AND CORONARY ANGIOGRAPHY;  Surgeon: Dionisio Calder, MD;  Location: [place] [facility];  Service: Cardiovascular;  Laterality: Left;   LEFT HEART CATH AND CORONARY ANGIOGRAPHY Left 06/25/2020  Procedure: LEFT HEART CATH AND CORONARY ANGIOGRAPHY;  Surgeon: Wellington Hampshire, MD;  Location: [place] [facility];  Service: Cardiovascular;  Laterality: Left;    Current Medications: Current Meds  Medication Sig   aspirin EC 81 MG tablet Take 1 tablet (81 mg total) by mouth daily. Swallow whole.   buPROPion (WELLBUTRIN XL) 150 MG 24 hr tablet Take 150 mg by mouth daily.   citalopram (CELEXA) 40 MG tablet Take 40 mg  by mouth daily.   isosorbide mononitrate (IMDUR) 60 MG 24 hr tablet Take 1 tablet (60 mg total) by mouth daily.   metoprolol succinate (TOPROL-XL) 25 MG 24 hr tablet TAKE 1 TABLET (25 MG TOTAL) BY MOUTH DAILY.   nitroGLYCERIN (NITROSTAT) 0.4 MG SL tablet Place 1 tablet (0.4 mg total) under the tongue every 5 (five) minutes as needed.   rosuvastatin (CRESTOR) 40 MG tablet Take 40 mg by mouth daily.   [DISCONTINUED] clopidogrel (PLAVIX) 75 MG tablet Take 75 mg by mouth daily.    Allergies:   Patient has no known allergies.   Social History   Socioeconomic History   Marital status: Married    Spouse name: Not on file   Number of children: Not on file   Years of education: Not on file   Highest education level: Not on file  Occupational History   Not on file  Tobacco Use   Smoking status: Never   Smokeless tobacco: Never  Vaping Use   Vaping Use: Never used  Substance and Sexual Activity   Alcohol use: No   Drug use: No   Sexual activity: Not on file  Other Topics Concern   Not on file  Social History Narrative   Lives at home with wife and daughter.   Social Determinants of Health   Financial Resource Strain: Not on file  Food Insecurity: Not on file  Transportation Needs: Not on file  Physical Activity: Not on file  Stress: Not on file  Social Connections: Not on file     Family History:  The patient's family history includes CAD in his mother.  ROS:   Review of Systems  Constitutional:  Negative for chills, diaphoresis, fever, malaise/fatigue and weight loss.  HENT:  Negative for congestion.   Eyes:  Negative for discharge and redness.  Respiratory:  Negative for cough, sputum production, shortness of breath and wheezing.   Cardiovascular:  Negative for chest pain, palpitations, orthopnea, claudication, leg swelling and PND.  Gastrointestinal:  Negative for abdominal pain, blood in stool, heartburn, melena, nausea and vomiting.  Musculoskeletal:  Negative for  falls and myalgias.  Skin:  Negative for rash.  Neurological:  Negative for dizziness, tingling, tremors, sensory change, speech change, focal weakness, loss of consciousness and weakness.  Endo/Heme/Allergies:  Does not bruise/bleed easily.  Psychiatric/Behavioral:  Negative for substance abuse. The patient is not nervous/anxious.     EKGs/Labs/Other Studies Reviewed:    Studies reviewed were summarized above. The additional studies were reviewed today:  LHC 06/25/2020: The left ventricular systolic function is normal. LV end diastolic pressure is normal. The left ventricular ejection fraction is 55-65% by visual estimate. Prox RCA to Mid RCA lesion is 40% stenosed. Prox RCA lesion is 20% stenosed. Prox LAD to Mid LAD lesion is 50% stenosed. Previously placed Ramus stent (unknown type) is widely patent. Previously placed 2nd Mrg-1 stent (unknown type) is widely patent. 2nd Mrg-2 lesion is 30% stenosed.   1.  Patent stents in OM 2 and ramus with no significant  restenosis.  Stable moderate proximal LAD and mid RCA disease. 2.  Normal LV systolic function and normal left ventricular end-diastolic pressure.   Recommendations: Continue aggressive medical therapy. __________  Carlton Adam MPI 06/19/2020: Abnormal, potentially high-risk pharmacologic myocardial perfusion stress test. There is a large in size, moderate in severity, nearly completely reversible defect involving the anterior and anteroseptal walls that is concerning for ischemia though an element of artifact cannot be excluded. The left ventricular ejection fraction is normal (60%). Attenuation correction demonstrates coronary stents and mild aortic atherosclerosis. __________  PCI 12/23/2018: Prox LAD lesion is 40% stenosed. Ost 1st Mrg lesion is 95% stenosed. Mid RCA lesion is 40% stenosed. A drug-eluting stent was successfully placed using a [place] 2.25X15. Post intervention, there is a 0% residual  stenosis.   1.  One-vessel CAD with 95% stenosis proximal OM1 2.  Successful PCI with DES proximal OM1  __________  Diagnostic LHC 12/23/2018: Ost 1st Mrg lesion is 95% stenosed. Prox LAD lesion is 40% stenosed. Mid RCA lesion is 40% stenosed.   Mild to moderate disease in mid LAD and mid RCA with critical lesion in OM1 in the proximal portion about 95%.  Advise PCI with drug-eluting stent. __________  For remaining details, please see detailed reports    EKG:  EKG is ordered today.  The EKG ordered today demonstrates NSR, 61 bpm, no acute ST-T changes  Recent Labs: 07/19/2020: BUN 15; Creatinine, Ser 1.04; Magnesium 2.1; Potassium 4.3; Sodium 141; TSH 1.790  Recent Lipid Panel    Component Value Date/Time   CHOL 122 06/04/2020 1549   TRIG 103 06/04/2020 1549   HDL 43 06/04/2020 1549   CHOLHDL 2.8 06/04/2020 1549   CHOLHDL 5.5 04/14/2016 0623   VLDL 31 04/14/2016 0623   LDLCALC 60 06/04/2020 1549   LDLDIRECT 61 06/04/2020 1549    PHYSICAL EXAM:    VS:  BP 128/86 (BP Location: Left Arm, Patient Position: Sitting, Cuff Size: Normal)   Pulse 61   Ht 5\' 11"  (1.803 m)   Wt 224 lb (101.6 kg)   SpO2 98%   BMI 31.24 kg/m   BMI: Body mass index is 31.24 kg/m.  Physical Exam Vitals reviewed.  Constitutional:      Appearance: He is well-developed.  HENT:     Head: Normocephalic and atraumatic.  Eyes:     General:        Right eye: No discharge.        Left eye: No discharge.  Neck:     Vascular: No JVD.  Cardiovascular:     Rate and Rhythm: Normal rate and regular rhythm.     Pulses:          Posterior tibial pulses are 2+ on the right side and 2+ on the left side.     Heart sounds: Normal heart sounds, S1 normal and S2 normal. Heart sounds not distant. No midsystolic click and no opening snap. No murmur heard.   No friction rub.  Pulmonary:     Effort: Pulmonary effort is normal. No respiratory distress.     Breath sounds: Normal breath sounds. No decreased  breath sounds, wheezing or rales.  Chest:     Chest wall: No tenderness.  Abdominal:     General: There is no distension.     Palpations: Abdomen is soft.     Tenderness: There is no abdominal tenderness.  Musculoskeletal:     Cervical back: Normal range of motion.     Right lower  leg: No edema.     Left lower leg: No edema.  Skin:    General: Skin is warm and dry.     Nails: There is no clubbing.  Neurological:     Mental Status: He is alert and oriented to person, place, and time.  Psychiatric:        Speech: Speech normal.        Behavior: Behavior normal.        Thought Content: Thought content normal.        Judgment: Judgment normal.    Wt Readings from Last 3 Encounters:  07/02/21 224 lb (101.6 kg)  01/22/21 235 lb (106.6 kg)  07/19/20 235 lb 4 oz (106.7 kg)     ASSESSMENT & PLAN:   CAD involving the native coronary arteries without angina: He is doing very well without any symptoms concerning for angina.  Given it has been since 2020 since he last underwent PCI, we will discontinue clopidogrel and have him continue aspirin 81 mg daily.  He was advised to restart aspirin at this time.  Continue secondary prevention and aggressive risk factor modification.  Continue current medical therapy including Imdur, Toprol-XL, Crestor, and as needed SL NTG.  No indication for further ischemic testing at this time.  HTN: Blood pressure is well controlled in the office with repeat BP of 121/72.  He remains on Toprol-XL and Imdur.  HLD: LDL 54 with a triglyceride of 130 in 11/2020.  Continue Crestor.  Disposition: F/u with Dr. Fletcher Anon or an APP in 6 months.   Medication Adjustments/Labs and Tests Ordered: Current medicines are reviewed at length with the patient today.  Concerns regarding medicines are outlined above. Medication changes, Labs and Tests ordered today are summarized above and listed in the Patient Instructions accessible in Encounters.   Signed, Christell Faith,  PA-C 07/02/2021 4:11 PM     [address] [place], [place] [address] [phone]

## 2021-07-02 ENCOUNTER — Other Ambulatory Visit: Payer: Self-pay

## 2021-07-02 ENCOUNTER — Encounter: Payer: Self-pay | Admitting: Physician Assistant

## 2021-07-02 ENCOUNTER — Ambulatory Visit: Payer: BC Managed Care – PPO | Admitting: Physician Assistant

## 2021-07-02 VITALS — BP 128/86 | HR 61 | Ht 71.0 in | Wt 224.0 lb

## 2021-07-02 DIAGNOSIS — I10 Essential (primary) hypertension: Secondary | ICD-10-CM | POA: Diagnosis not present

## 2021-07-02 DIAGNOSIS — I2510 Atherosclerotic heart disease of native coronary artery without angina pectoris: Secondary | ICD-10-CM | POA: Diagnosis not present

## 2021-07-02 DIAGNOSIS — E785 Hyperlipidemia, unspecified: Secondary | ICD-10-CM

## 2021-07-02 MED ORDER — ASPIRIN EC 81 MG PO TBEC: 81.0000 mg | DELAYED_RELEASE_TABLET | Freq: Every day | ORAL | 3 refills | Status: DC

## 2021-07-02 NOTE — Patient Instructions (Signed)
Medication Instructions:  Your physician has recommended you make the following change in your medication:   STOP Plavix (Clopidogrel)  START Aspirin 81 mg once a day  *If you need a refill on your cardiac medications before your next appointment, please call your pharmacy*   Lab Work: None  If you have labs (blood work) drawn today and your tests are completely normal, you will receive your results only by: [facility] (if you have MyChart) OR A paper copy in the mail If you have any lab test that is abnormal or we need to change your treatment, we will call you to review the results.   Testing/Procedures: None   Follow-Up: At [facility], you and your health needs are our priority.  As part of our continuing mission to provide you with exceptional heart care, we have created designated Provider Care Teams.  These Care Teams include your primary Cardiologist (physician) and Advanced Practice Providers (APPs -  Physician Assistants and Nurse Practitioners) who all work together to provide you with the care you need, when you need it.  We recommend signing up for the patient portal called "MyChart".  Sign up information is provided on this After Visit Summary.  MyChart is used to connect with patients for Virtual Visits (Telemedicine).  Patients are able to view lab/test results, encounter notes, upcoming appointments, etc.  Non-urgent messages can be sent to your provider as well.   To learn more about what you can do with MyChart, go to [email or web address].    Your next appointment:   6 month(s)  The format for your next appointment:   In Person  Provider:   You may see Kathlyn Sacramento, MD or one of the following Advanced Practice Providers on your designated Care Team:   Murray Hodgkins, NP Christell Faith, PA-C Marrianne Mood, PA-C Cadence Winfield, [place]

## 2021-07-26 ENCOUNTER — Other Ambulatory Visit: Payer: Self-pay | Admitting: Cardiovascular Disease

## 2021-08-19 ENCOUNTER — Telehealth: Payer: Self-pay | Admitting: Cardiovascular Disease

## 2021-08-19 ENCOUNTER — Other Ambulatory Visit: Payer: Self-pay | Admitting: Cardiovascular Disease

## 2021-08-19 NOTE — Telephone Encounter (Signed)
Duplicate request, medication has already been sent to patient's pharmacy.

## 2021-08-19 NOTE — Telephone Encounter (Signed)
°*  STAT* If patient is at the pharmacy, call can be transferred to refill team.   1. Which medications need to be refilled? (please list name of each medication and dose if known) rosuvastatin 40 MG 1 tablet daily  2. Which pharmacy/location (including street and city if local pharmacy) is medication to be sent to? [facility] on [address]   3. Do they need a 30 day or 90 day supply? 90 day

## 2021-09-25 ENCOUNTER — Emergency Department
Admission: EM | Admit: 2021-09-25 | Discharge: 2021-09-25 | Disposition: A | Discharge status: Home / Self Care | Payer: BC Managed Care – PPO | Attending: Emergency Medicine | Admitting: Emergency Medicine

## 2021-09-25 ENCOUNTER — Emergency Department: Payer: BC Managed Care – PPO

## 2021-09-25 ENCOUNTER — Other Ambulatory Visit: Payer: Self-pay

## 2021-09-25 DIAGNOSIS — R072 Precordial pain: Secondary | ICD-10-CM | POA: Insufficient documentation

## 2021-09-25 DIAGNOSIS — R1013 Epigastric pain: Secondary | ICD-10-CM | POA: Insufficient documentation

## 2021-09-25 DIAGNOSIS — R079 Chest pain, unspecified: Secondary | ICD-10-CM | POA: Diagnosis not present

## 2021-09-25 DIAGNOSIS — I10 Essential (primary) hypertension: Secondary | ICD-10-CM | POA: Diagnosis not present

## 2021-09-25 DIAGNOSIS — R110 Nausea: Secondary | ICD-10-CM | POA: Diagnosis not present

## 2021-09-25 DIAGNOSIS — I2510 Atherosclerotic heart disease of native coronary artery without angina pectoris: Secondary | ICD-10-CM | POA: Insufficient documentation

## 2021-09-25 DIAGNOSIS — R109 Unspecified abdominal pain: Secondary | ICD-10-CM | POA: Diagnosis not present

## 2021-09-25 DIAGNOSIS — K219 Gastro-esophageal reflux disease without esophagitis: Secondary | ICD-10-CM | POA: Insufficient documentation

## 2021-09-25 DIAGNOSIS — I700 Atherosclerosis of aorta: Secondary | ICD-10-CM | POA: Diagnosis not present

## 2021-09-25 DIAGNOSIS — R0789 Other chest pain: Secondary | ICD-10-CM | POA: Diagnosis not present

## 2021-09-25 LAB — HEPATIC FUNCTION PANEL
ALT: 20 U/L (ref 0–44)
AST: 19 U/L (ref 15–41)
Albumin: 4.3 g/dL (ref 3.5–5.0)
Alkaline Phosphatase: 42 U/L (ref 38–126)
Bilirubin, Direct: <0.1 mg/dL (ref 0.0–0.2)
Total Bilirubin: 1 mg/dL (ref 0.3–1.2)
Total Protein: 7.2 g/dL (ref 6.5–8.1)

## 2021-09-25 LAB — BASIC METABOLIC PANEL
Anion gap: 10 (ref 5–15)
BUN: 15 mg/dL (ref 8–23)
CO2: 25 mmol/L (ref 22–32)
Calcium: 9.2 mg/dL (ref 8.9–10.3)
Chloride: 100 mmol/L (ref 98–111)
Creatinine, Ser: 1.07 mg/dL (ref 0.61–1.24)
GFR, Estimated: >60 mL/min (ref >60)
Glucose, Bld: 92 mg/dL (ref 70–99)
Potassium: 3.7 mmol/L (ref 3.5–5.1)
Sodium: 135 mmol/L (ref 135–145)

## 2021-09-25 LAB — CBC
HCT: 39.8 % (ref 39.0–52.0)
Hemoglobin: 13.5 g/dL (ref 13.0–17.0)
MCH: 29.9 pg (ref 26.0–34.0)
MCHC: 33.9 g/dL (ref 30.0–36.0)
MCV: 88.2 fL (ref 80.0–100.0)
Platelets: 183 10*3/uL (ref 150–400)
RBC: 4.51 MIL/uL (ref 4.22–5.81)
RDW: 12.6 % (ref 11.5–15.5)
WBC: 13.9 10*3/uL — ABNORMAL HIGH (ref 4.0–10.5)
nRBC: 0 % (ref 0.0–0.2)

## 2021-09-25 LAB — LIPASE, BLOOD: Lipase: 29 U/L (ref 11–51)

## 2021-09-25 LAB — TROPONIN I (HIGH SENSITIVITY)
Troponin I (High Sensitivity): 4 ng/L (ref <18)
Troponin I (High Sensitivity): 4 ng/L (ref <18)

## 2021-09-25 MED ORDER — SUCRALFATE 1 G PO TABS
1.0000 g | ORAL_TABLET | Freq: Once | ORAL | Status: AC
  Administered 2021-09-25: 20:00:00 1 g via ORAL
  Filled 2021-09-25: qty 1

## 2021-09-25 MED ORDER — PANTOPRAZOLE SODIUM 40 MG PO TBEC
40.0000 mg | DELAYED_RELEASE_TABLET | Freq: Once | ORAL | Status: AC
  Administered 2021-09-25: 20:00:00 40 mg via ORAL
  Filled 2021-09-25: qty 1

## 2021-09-25 MED ORDER — PANTOPRAZOLE SODIUM 40 MG PO TBEC: 40.0000 mg | DELAYED_RELEASE_TABLET | Freq: Every day | ORAL | 0 refills | Status: AC

## 2021-09-25 MED ORDER — SUCRALFATE 1 G PO TABS: 1.0000 g | ORAL_TABLET | Freq: Four times a day (QID) | ORAL | 0 refills | Status: DC

## 2021-09-25 MED ORDER — ALUM & MAG HYDROXIDE-SIMETH 200-200-20 MG/5ML PO SUSP
30.0000 mL | Freq: Once | ORAL | Status: AC
  Administered 2021-09-25: 20:00:00 30 mL via ORAL
  Filled 2021-09-25: qty 30

## 2021-09-25 MED ORDER — IOHEXOL 350 MG/ML SOLN
100.0000 mL | Freq: Once | INTRAVENOUS | Status: AC | PRN
  Administered 2021-09-25: 22:00:00 100 mL via INTRAVENOUS
  Filled 2021-09-25: qty 100

## 2021-09-25 MED ORDER — HYDROCODONE-ACETAMINOPHEN 5-325 MG PO TABS
1.0000 | ORAL_TABLET | Freq: Once | ORAL | Status: AC
  Administered 2021-09-25: 22:00:00 1 via ORAL
  Filled 2021-09-25: qty 1

## 2021-09-25 NOTE — Discharge Instructions (Signed)
Your CT today showed: FINDINGS: Lower chest: No acute abnormality.   Hepatobiliary: No focal hepatic abnormality. Gallbladder unremarkable.   Pancreas: No focal abnormality or ductal dilatation.   Spleen: No focal abnormality.  Normal size.   Adrenals/Urinary Tract: No adrenal abnormality. No focal renal abnormality. No stones or hydronephrosis. Urinary bladder is unremarkable.   Stomach/Bowel: Stomach, large and small bowel grossly unremarkable. Appendix is normal.   Vascular/Lymphatic: Aortic atherosclerosis. No evidence of aneurysm or adenopathy.   Reproductive: No visible focal abnormality.   Other: No free fluid or free air.   Musculoskeletal: No acute bony abnormality.   IMPRESSION: No acute findings in the abdomen or pelvis.   Scattered aortic atherosclerosis.

## 2021-09-25 NOTE — ED Provider Notes (Signed)
[facility] Provider Note    Event Date/Time   First MD Initiated Contact with Patient 09/25/21 1930     (approximate)   History   Chest Pain   HPI  Carlos Diaz is a 61 y.o. male with past medical history of CAD status post PCI with stents followed by cardiology, HTN, HDL, GERD, depression and anxiety who presents for assessment of approximately 3 days of some epigastric discomfort and substernal chest pain he describes as burning.  He has not had any vomiting, fevers, cough, shortness of breath, lower abdominal pain, diarrhea, constipation, urinary symptoms, back pain, headache, earache, sore throat rash or extremity pain.  Denies EtOH use or significant NSAID use.  No prior similar episodes.  He is not on antacids and denies any history of ulcers or esophageal spasm.  No other acute concerns at this time.      Physical Exam  Triage Vital Signs: ED Triage Vitals [09/25/21 1834]  Enc Vitals Group     BP (!) 168/98     Pulse Rate 65     Resp 17     Temp 98.6 F (37 C)     Temp Source Oral     SpO2 98 %     Weight 232 lb (105.2 kg)     Height 5\' 11"  (1.803 m)     Head Circumference      Peak Flow      Pain Score      Pain Loc      Pain Edu?      Excl. in [place]?     Most recent vital signs: Vitals:   09/25/21 1834 09/25/21 2238  BP: (!) 168/98 140/79  Pulse: 65 66  Resp: 17 18  Temp: 98.6 F (37 C)   SpO2: 98% 99%    General: Awake, no distress.  CV:  Good peripheral perfusion.  2+ radial pulses.  No murmurs rubs or gallops. Resp:  Normal effort.  Clear bilaterally. Abd:  No distention.  Mild tenderness in the epigastrium.    ED Results / Procedures / Treatments  Labs (all labs ordered are listed, but only abnormal results are displayed) Labs Reviewed  CBC - Abnormal; Notable for the following components:      Result Value   WBC 13.9 (*)    All other components within normal limits  BASIC METABOLIC PANEL  HEPATIC FUNCTION PANEL   LIPASE, BLOOD  TROPONIN I (HIGH SENSITIVITY)  TROPONIN I (HIGH SENSITIVITY)     EKG  EKG shows a sinus rhythm with a ventricular rate of 67, normal axis, unremarkable intervals without evidence of acute ischemia or significant arrhythmia.   RADIOLOGY Chest reviewed by myself shows no focal consoidation, effusion, edema, pneumothorax or other clear acute thoracic process. I also reviewed radiology interpretation and agree with findings described.  CT abdomen pelvis reviewed and ordered by myself shows no evidence of cholecystitis, pancreatitis, diverticulitis, kidney stone or AAA or other acute abdominal or pelvic process.  There are some scattered aortic atherosclerosis.  PROCEDURES:  Critical Care performed: No  Procedures    MEDICATIONS ORDERED IN ED: Medications  alum & mag hydroxide-simeth (MAALOX/MYLANTA) 200-200-20 MG/5ML suspension 30 mL (30 mLs Oral Given 09/25/21 2001)  pantoprazole (PROTONIX) EC tablet 40 mg (40 mg Oral Given 09/25/21 2002)  sucralfate (CARAFATE) tablet 1 g (1 g Oral Given 09/25/21 2002)  HYDROcodone-acetaminophen (NORCO/VICODIN) 5-325 MG per tablet 1 tablet (1 tablet Oral Given 09/25/21 2207)  iohexol (OMNIPAQUE) 350 MG/ML  injection 100 mL (100 mLs Intravenous Contrast Given 09/25/21 2225)     IMPRESSION / MDM / ASSESSMENT AND PLAN / ED COURSE  I reviewed the triage vital signs and the nursing notes.                              Differential diagnosis includes, but is not limited to ACS, GERD, esophageal spasm, pneumonia, cholecystitis, pancreatitis and gastritis with lower suspicion based on patient's initial H&P for dissection or ruptured AAA.  Patient is at high risk for ACS given his history of same.  EKG shows a sinus rhythm with a ventricular rate of 67, normal axis, unremarkable intervals without evidence of acute ischemia or significant arrhythmia.  Given nonelevated troponins x2 I have low suspicion for ACS.  Lipase not suggestive of  pancreatitis.  CBC with nonspecific leukocytosis with WBC count of 13.9 without evidence of acute anemia and normal platelets.  Chest reviewed by myself shows no focal consoidation, effusion, edema, pneumothorax or other clear acute thoracic process. I also reviewed radiology interpretation and agree with findings described..  CT abdomen pelvis reviewed and ordered by myself shows no evidence of cholecystitis, pancreatitis, diverticulitis, kidney stone or AAA or other acute abdominal or pelvic process.  There are some scattered aortic atherosclerosis.  Patient treated with some analgesia and GI cocktail and on reassessment was feeling better.  I suspect possible esophageal spasm or gastritis although advised patient he should follow-up with his PCP to see if any additional testing needs to be done.  He is to return immediately to the emergency room for any new or worsening symptoms.  I considered observation admission given his, 85s although given he is feeling better with stable vitals and reassuring work-up I think he is stable for discharge with outpatient follow-up.  Discharged in stable condition.  Strict return precautions advised and discussed.     FINAL CLINICAL IMPRESSION(S) / ED DIAGNOSES   Final diagnoses:  Chest pain, unspecified type  Epigastric pain     Rx / DC Orders   ED Discharge Orders          Ordered    pantoprazole (PROTONIX) 40 MG tablet  Daily        09/25/21 2256    sucralfate (CARAFATE) 1 g tablet  4 times daily        09/25/21 2256             Note:  This document was prepared using Dragon voice recognition software and may include unintentional dictation errors.   Lucrezia Starch, MD 09/25/21 2258

## 2021-09-25 NOTE — ED Notes (Signed)
FULL RAINBOW SET SENT TO LAB. INCLUDING A BLUE TOP DUE TO PT BEING IN BLOOD THINNERS.

## 2021-09-25 NOTE — ED Triage Notes (Signed)
Pt c/o epigastric/chest pain with nausea for the past couple of days. States he has a hx of MI with stent placement

## 2021-09-26 ENCOUNTER — Encounter: Payer: Self-pay | Admitting: Medical

## 2021-09-26 ENCOUNTER — Ambulatory Visit: Payer: BC Managed Care – PPO | Admitting: Medical

## 2021-09-26 VITALS — BP 122/70 | HR 63 | Ht 72.0 in | Wt 237.0 lb

## 2021-09-26 DIAGNOSIS — E785 Hyperlipidemia, unspecified: Secondary | ICD-10-CM | POA: Diagnosis not present

## 2021-09-26 DIAGNOSIS — R079 Chest pain, unspecified: Secondary | ICD-10-CM | POA: Diagnosis not present

## 2021-09-26 DIAGNOSIS — I2510 Atherosclerotic heart disease of native coronary artery without angina pectoris: Secondary | ICD-10-CM | POA: Diagnosis not present

## 2021-09-26 DIAGNOSIS — I10 Essential (primary) hypertension: Secondary | ICD-10-CM

## 2021-09-26 NOTE — Patient Instructions (Signed)
Medication Instructions:   Your physician recommends that you continue on your current medications as directed. Please refer to the Current Medication list given to you today.  *If you need a refill on your cardiac medications before your next appointment, please call your pharmacy*   Lab Work:  None Ordered  If you have labs (blood work) drawn today and your tests are completely normal, you will receive your results only by: [place] (if you have MyChart) OR A paper copy in the mail If you have any lab test that is abnormal or we need to change your treatment, we will call you to review the results.   Testing/Procedures:  [facility] Treadmill MYOVIEW  Your caregiver has ordered a Stress Test with nuclear imaging. The purpose of this test is to evaluate the blood supply to your heart muscle. This procedure is referred to as a "Non-Invasive Stress Test." This is because other than having an IV started in your vein, nothing is inserted or "invades" your body. Cardiac stress tests are done to find areas of poor blood flow to the heart by determining the extent of coronary artery disease (CAD). Some patients exercise on a treadmill, which naturally increases the blood flow to your heart, while others who are  unable to walk on a treadmill due to physical limitations have a pharmacologic/chemical stress agent called Lexiscan . This medicine will mimic walking on a treadmill by temporarily increasing your coronary blood flow.   Please note: these test may take anywhere between 2-4 hours to complete  PLEASE REPORT TO [place] AT THE FIRST DESK WILL DIRECT YOU WHERE TO GO  Date of Procedure:_____________________________________  Arrival Time for Procedure:______________________________  Instructions regarding medication:   Hold Metoprolol the morning of procedure   PLEASE NOTIFY THE OFFICE AT LEAST 24 HOURS IN ADVANCE IF YOU ARE UNABLE TO KEEP YOUR  APPOINTMENT.  [phone] AND  PLEASE NOTIFY NUCLEAR MEDICINE AT [facility] AT LEAST 24 HOURS IN ADVANCE IF YOU ARE UNABLE TO KEEP YOUR APPOINTMENT. [phone]  How to prepare for your Myoview test:  Do not eat or drink after midnight No caffeine for 24 hours prior to test No smoking 24 hours prior to test. Your medication may be taken with water.  If your doctor stopped a medication because of this test, do not take that medication. -Metoprolol No perfume, cologne or lotion. Wear comfortable walking shoes. No heels!   Follow-Up: At [facility], you and your health needs are our priority.  As part of our continuing mission to provide you with exceptional heart care, we have created designated Provider Care Teams.  These Care Teams include your primary Cardiologist (physician) and Advanced Practice Providers (APPs -  Physician Assistants and Nurse Practitioners) who all work together to provide you with the care you need, when you need it.  We recommend signing up for the patient portal called "MyChart".  Sign up information is provided on this After Visit Summary.  MyChart is used to connect with patients for Virtual Visits (Telemedicine).  Patients are able to view lab/test results, encounter notes, upcoming appointments, etc.  Non-urgent messages can be sent to your provider as well.   To learn more about what you can do with MyChart, go to [email or web address].    Your next appointment:   6 week(s)  The format for your next appointment:   In Person  Provider:   You may see Kathlyn Sacramento, MD or one of the following Advanced  Practice Providers on your designated Care Team:   Murray Hodgkins, NP Christell Faith, PA-C Cadence Kathlen Mody, [place]

## 2021-09-26 NOTE — Progress Notes (Signed)
Cardiology Office Note:    Date:  09/26/2021   ID:  Carlos Diaz, DOB 05-Jun-1961, MRN [id number]  PCP:  Valera Castle, MD  [facility] Cardiologist:  Kathlyn Sacramento, MD  [place] Electrophysiologist:  None   Referring MD: Valera Castle, *   Chief Complaint: ER f/u  History of Present Illness:    Carlos Diaz is a 61 y.o. male with a hx of with history of CAD with multiple PCI's as outlined below, HTN, HLD, anxiety, and depression who presents for follow-up of his CAD.   He was admitted in 2017 with a NSTEMI.  LHC showed high-grade stenosis in the ramus intermedius which was successfully treated with PCI/DES.  He had recurrent angina in 12/2018 with repeat LHC showing a patent ramus stent.  There was high-grade stenosis in a proximal OM branch which was treated with PCI/DES.  He was also noted to have moderate proximal to mid LAD stenosis along with mild RCA disease.  He was seen in 06/2020 with atypical chest pain with subsequent Lexiscan MPI showing evidence of anterior and anteroseptal ischemia with normal EF.  Based on that, he underwent repeat LHC in 06/2020 which demonstrated patent stents in the ramus and OM2 with no significant restenosis.  There was stable moderate proximal LAD and mid RCA disease.  He had a normal LV systolic function and LVEDP.  Medical therapy was recommended.  He was last seen in the office in 12/2020 and was doing well without recurrent chest pain, dyspnea, or palpitations.  He was trying to exercise more.  He did note some fatigue.  Last seen 07/02/21  and was overall doing well from a cardiac perspective. Plavix was discontinued, ASA continued.   Seen in the ED 09/15/21 for chest pain. BP elevated, but they had the wrong size cuff, BP 140/79, WBC 13.9. HS trop negative x 2. CXR negative. CTA abd/pelvis unremarkable. He was given GI cocktail and discharged.   Today, the patient reports chest and abdominal pain. Started Monday. Described as a  burning pain that starts in the epigastric area and goes up into the chest. Occurs at rest. He had constant chest discomfort, no SOB. When he got home it was worse. A little nasuea, no vomiting. No diaphoresis.Marland Kitchen no fever, maybe chills. No orthopnea, LLE, pnd. The GI cocktail and pain med improved the pain. Does not have the pain today. HE went off Protonix 6 months ago, just started that and sucralfate today.   Past Medical History:  Diagnosis Date   Anxiety    Depression    GERD (gastroesophageal reflux disease)    Hyperlipidemia    Hypertension    Lyme disease    Myocardial infarction ([place]) 04/13/2016    Past Surgical History:  Procedure Laterality Date   CARDIAC CATHETERIZATION Right 04/14/2016   Procedure: Left Heart Cath and Coronary Angiography;  Surgeon: Dionisio Gurveer, MD;  Location: [facility] CV LAB;  Service: Cardiovascular;  Laterality: Right;   CARDIAC CATHETERIZATION N/A 04/14/2016   Procedure: Coronary Stent Intervention;  Surgeon: Yolonda Kida, MD;  Location: [facility] CV LAB;  Service: Cardiovascular;  Laterality: N/A;   COLONOSCOPY     COLONOSCOPY WITH PROPOFOL N/A 03/26/2020   Procedure: COLONOSCOPY WITH PROPOFOL;  Surgeon: Lesly Rubenstein, MD;  Location: [facility] ENDOSCOPY;  Service: Endoscopy;  Laterality: N/A;   CORONARY ANGIOPLASTY     CORONARY STENT INTERVENTION N/A 12/23/2018   Procedure: CORONARY STENT INTERVENTION;  Surgeon: Isaias Cowman, MD;  Location: [facility]  INVASIVE CV LAB;  Service: Cardiovascular;  Laterality: N/A;   LEFT HEART CATH AND CORONARY ANGIOGRAPHY Left 12/23/2018   Procedure: LEFT HEART CATH AND CORONARY ANGIOGRAPHY;  Surgeon: Dionisio Alby, MD;  Location: [place] CV LAB;  Service: Cardiovascular;  Laterality: Left;   LEFT HEART CATH AND CORONARY ANGIOGRAPHY Left 06/25/2020   Procedure: LEFT HEART CATH AND CORONARY ANGIOGRAPHY;  Surgeon: Wellington Hampshire, MD;  Location: [place] CV LAB;  Service: Cardiovascular;   Laterality: Left;    Current Medications: No outpatient medications have been marked as taking for the 09/26/21 encounter (Appointment) with Kathlen Mody, Sherell Christoffel H, PA-C.     Allergies:   Patient has no known allergies.   Social History   Socioeconomic History   Marital status: Married    Spouse name: Not on file   Number of children: Not on file   Years of education: Not on file   Highest education level: Not on file  Occupational History   Not on file  Tobacco Use   Smoking status: Never   Smokeless tobacco: Never  Vaping Use   Vaping Use: Never used  Substance and Sexual Activity   Alcohol use: No   Drug use: No   Sexual activity: Not on file  Other Topics Concern   Not on file  Social History Narrative   Lives at home with wife and daughter.   Social Determinants of Health   Financial Resource Strain: Not on file  Food Insecurity: Not on file  Transportation Needs: Not on file  Physical Activity: Not on file  Stress: Not on file  Social Connections: Not on file     Family History: The patient's family history includes CAD in his mother.  ROS:   Please see the history of present illness.     All other systems reviewed and are negative.  EKGs/Labs/Other Studies Reviewed:    The following studies were reviewed today:  LHC 06/25/2020: The left ventricular systolic function is normal. LV end diastolic pressure is normal. The left ventricular ejection fraction is 55-65% by visual estimate. Prox RCA to Mid RCA lesion is 40% stenosed. Prox RCA lesion is 20% stenosed. Prox LAD to Mid LAD lesion is 50% stenosed. Previously placed Ramus stent (unknown type) is widely patent. Previously placed 2nd Mrg-1 stent (unknown type) is widely patent. 2nd Mrg-2 lesion is 30% stenosed.   1.  Patent stents in OM 2 and ramus with no significant restenosis.  Stable moderate proximal LAD and mid RCA disease. 2.  Normal LV systolic function and normal left ventricular end-diastolic  pressure.   Recommendations: Continue aggressive medical therapy. __________   Carlton Adam MPI 06/19/2020: Abnormal, potentially high-risk pharmacologic myocardial perfusion stress test. There is a large in size, moderate in severity, nearly completely reversible defect involving the anterior and anteroseptal walls that is concerning for ischemia though an element of artifact cannot be excluded. The left ventricular ejection fraction is normal (60%). Attenuation correction demonstrates coronary stents and mild aortic atherosclerosis. __________   PCI 12/23/2018: Prox LAD lesion is 40% stenosed. Ost 1st Mrg lesion is 95% stenosed. Mid RCA lesion is 40% stenosed. A drug-eluting stent was successfully placed using a [place] [id number]. Post intervention, there is a 0% residual stenosis.   1.  One-vessel CAD with 95% stenosis proximal OM1 2.  Successful PCI with DES proximal OM1  __________   Diagnostic LHC 12/23/2018: Ost 1st Mrg lesion is 95% stenosed. Prox LAD lesion is 40% stenosed. Mid RCA lesion is  40% stenosed.   Mild to moderate disease in mid LAD and mid RCA with critical lesion in OM1 in the proximal portion about 95%.  Advise PCI with drug-eluting stent.  EKG:  EKG is  ordered today.  The ekg ordered today demonstrates NSR, 67bpm, no ischemic changes  Recent Labs: 09/25/2021: ALT 20; BUN 15; Creatinine, Ser 1.07; Hemoglobin 13.5; Platelets 183; Potassium 3.7; Sodium 135  Recent Lipid Panel    Component Value Date/Time   CHOL 122 06/04/2020 1549   TRIG 103 06/04/2020 1549   HDL 43 06/04/2020 1549   CHOLHDL 2.8 06/04/2020 1549   CHOLHDL 5.5 04/14/2016 0623   VLDL 31 04/14/2016 0623   LDLCALC 60 06/04/2020 1549   LDLDIRECT 61 06/04/2020 1549     Physical Exam:    VS:  There were no vitals taken for this visit.    Wt Readings from Last 3 Encounters:  09/25/21 232 lb (105.2 kg)  07/02/21 224 lb (101.6 kg)  01/22/21 235 lb (106.6 kg)     GEN:  Well  nourished, well developed in no acute distress HEENT: Normal NECK: No JVD; No carotid bruits LYMPHATICS: No lymphadenopathy CARDIAC: RRR, no murmurs, rubs, gallops RESPIRATORY:  Clear to auscultation without rales, wheezing or rhonchi  ABDOMEN: Soft, non-tender, non-distended MUSCULOSKELETAL:  No edema; No deformity  SKIN: Warm and dry NEUROLOGIC:  Alert and oriented x 3 PSYCHIATRIC:  Normal affect   ASSESSMENT:    1. Chest pain of uncertain etiology   2. Coronary artery disease involving native coronary artery of native heart without angina pectoris   3. Hyperlipidemia, unspecified hyperlipidemia type   4. Essential hypertension    PLAN:    In order of problems listed above:  Chest pain CAD Recent ER visit for chest pain described as a burning sensation starting in the epigastric area and radiating upward with associated ome nasuea. Chest pain is not worse with exertion. GI cocktail in the ER improved the pain. HS trop negative and EGK without ischemic changes. Suspect mostly GI etiology for pain, however given CAD history I will order Treadmill Myoview test. Continue Aspirin, statin Toprol Imdur 60mg  daily. Ed started Carafate and he restart Protonix, he had self discontinued PPI 6 months ago.   HTN BP mildly elevated in the ER. BP today is good. Continue current medications.   HLD Continue Crestor.   Disposition: Follow up in 4 week(s) with MD/APP   Shared Decision Making/Informed Consent   Shared Decision Making/Informed Consent The risks [chest pain, shortness of breath, cardiac arrhythmias, dizziness, blood pressure fluctuations, myocardial infarction, stroke/transient ischemic attack, nausea, vomiting, allergic reaction, radiation exposure, metallic taste sensation and life-threatening complications (estimated to be 1 in 10,000)], benefits (risk stratification, diagnosing coronary artery disease, treatment guidance) and alternatives of a nuclear stress test were discussed  in detail with Mr. Stroble and he agrees to proceed.    Signed, Carisa Backhaus Ninfa Meeker, PA-C  09/26/2021 12:14 PM    [facility]

## 2021-09-27 ENCOUNTER — Other Ambulatory Visit: Payer: Self-pay | Admitting: Cardiovascular Disease

## 2021-09-27 DIAGNOSIS — I2510 Atherosclerotic heart disease of native coronary artery without angina pectoris: Secondary | ICD-10-CM

## 2021-10-11 ENCOUNTER — Ambulatory Visit: Payer: BC Managed Care – PPO | Admitting: Medical

## 2021-10-31 ENCOUNTER — Telehealth: Payer: Self-pay | Admitting: Cardiovascular Disease

## 2021-10-31 NOTE — Telephone Encounter (Signed)
Patient has rescheduled his nuclear stress test and would like to know which medication he should stop before this test. Please call and advise.

## 2021-10-31 NOTE — Telephone Encounter (Signed)
Spoke with the patient. ?Patient is scheduled for a stress myoview on 11/13/21. ?Pt sts that he normally takes his medications including his metoprolol at night. ?Adv the patient to hold his metoprolol the night before the stress test. ? ?Patient verbalized understanding and voiced appreciation for the call. ?

## 2021-11-13 ENCOUNTER — Other Ambulatory Visit: Payer: Self-pay

## 2021-11-13 ENCOUNTER — Encounter
Admission: RE | Admit: 2021-11-13 | Discharge: 2021-11-13 | Disposition: A | Discharge status: Home / Self Care | Payer: BC Managed Care – PPO | Source: Ambulatory Visit | Attending: Medical | Admitting: Medical

## 2021-11-13 DIAGNOSIS — R079 Chest pain, unspecified: Secondary | ICD-10-CM | POA: Insufficient documentation

## 2021-11-13 LAB — NM MYOCAR MULTI W/SPECT W/WALL MOTION / EF
Angina Index: 0
Duke Treadmill Score: 9
Estimated workload: 10.1
Exercise duration (min): 8 min
Exercise duration (sec): 43 s
LV dias vol: 100 mL (ref 62–150)
LV sys vol: 29 mL
MPHR: 158 {beats}/min
Nuc Stress EF: 71 %
Peak HR: 141 {beats}/min
Percent HR: 89 %
Rest HR: 57 {beats}/min
Rest Nuclear Isotope Dose: 10.8 mCi
SDS: 1
SRS: 4
SSS: 4
ST Depression (mm): 0 mm
Stress Nuclear Isotope Dose: 32.4 mCi
TID: 0.79

## 2021-11-13 MED ORDER — TECHNETIUM TC 99M TETROFOSMIN IV KIT
10.8300 | PACK | Freq: Once | INTRAVENOUS | Status: AC | PRN
  Administered 2021-11-13: 10.83 via INTRAVENOUS

## 2021-11-13 MED ORDER — TECHNETIUM TC 99M TETROFOSMIN IV KIT
32.3500 | PACK | Freq: Once | INTRAVENOUS | Status: AC | PRN
  Administered 2021-11-13: 32.35 via INTRAVENOUS

## 2021-11-14 ENCOUNTER — Ambulatory Visit (INDEPENDENT_AMBULATORY_CARE_PROVIDER_SITE_OTHER): Payer: BC Managed Care – PPO

## 2021-11-14 ENCOUNTER — Telehealth: Payer: Self-pay | Admitting: Emergency Medicine

## 2021-11-14 ENCOUNTER — Ambulatory Visit: Payer: BC Managed Care – PPO | Admitting: Medical

## 2021-11-14 ENCOUNTER — Ambulatory Visit: Payer: BC Managed Care – PPO | Admitting: Cardiovascular Disease

## 2021-11-14 ENCOUNTER — Encounter: Payer: Self-pay | Admitting: Medical

## 2021-11-14 VITALS — BP 148/90 | HR 62 | Ht 72.0 in | Wt 237.0 lb

## 2021-11-14 DIAGNOSIS — I6523 Occlusion and stenosis of bilateral carotid arteries: Secondary | ICD-10-CM

## 2021-11-14 DIAGNOSIS — I10 Essential (primary) hypertension: Secondary | ICD-10-CM | POA: Diagnosis not present

## 2021-11-14 DIAGNOSIS — I2510 Atherosclerotic heart disease of native coronary artery without angina pectoris: Secondary | ICD-10-CM | POA: Diagnosis not present

## 2021-11-14 DIAGNOSIS — E782 Mixed hyperlipidemia: Secondary | ICD-10-CM | POA: Diagnosis not present

## 2021-11-14 NOTE — Telephone Encounter (Signed)
Called and spoke with patient. Results reviewed with patient, pt verbalized understanding,  questions (if any) answered.   ?

## 2021-11-14 NOTE — Telephone Encounter (Signed)
-----   Message from [place], PA-C sent at 11/14/2021  7:53 AM EDT ----- ?Myoview lexiscan showed no blockages, overall low risk study, normal pump function. Coronary calcification noted in 2 vessels.  ?

## 2021-11-14 NOTE — Patient Instructions (Addendum)
Medication Instructions:  ?- Your physician recommends that you continue on your current medications as directed. Please refer to the Current Medication list given to you today. ? ?*If you need a refill on your cardiac medications before your next appointment, please call your pharmacy* ? ? ?Lab Work: ?- none ordered ? ?If you have labs (blood work) drawn today and your tests are completely normal, you will receive your results only by: ?MyChart Message (if you have MyChart) OR ?A paper copy in the mail ?If you have any lab test that is abnormal or we need to change your treatment, we will call you to review the results. ? ? ?Testing/Procedures: ? ?1) Carotid Ultrasound: today ?- Your physician has requested that you have a carotid duplex. This test is an ultrasound of the carotid arteries in your neck. It looks at blood flow through these arteries that supply the brain with blood. Allow one hour for this exam. There are no restrictions or special instructions. ? ?2) Ambulatory referral to Vascular Surgery:  ? ? [facility] ? [address],  ?[place], [place] [address] ?[phone] ? ?Their office will call you directly with an appointment ? ? ?Follow-Up: ?At [facility], you and your health needs are our priority.  As part of our continuing mission to provide you with exceptional heart care, we have created designated Provider Care Teams.  These Care Teams include your primary Cardiologist (physician) and Advanced Practice Providers (APPs -  Physician Assistants and Nurse Practitioners) who all work together to provide you with the care you need, when you need it. ? ?We recommend signing up for the patient portal called "MyChart".  Sign up information is provided on this After Visit Summary.  MyChart is used to connect with patients for Virtual Visits (Telemedicine).  Patients are able to view lab/test results, encounter notes, upcoming appointments, etc.  Non-urgent messages can be sent to your  provider as well.   ?To learn more about what you can do with MyChart, go to [email or web address].   ? ?Your next appointment:   ?2 month(s) ? ?The format for your next appointment:   ?In Person ? ?Provider:   ?You may see Kathlyn Sacramento, MD or one of the following Advanced Practice Providers on your designated Care Team:   ? ?Cadence Kathlen Mody, PA-C  ? ? ?Other Instructions ?N/a ? ?

## 2021-11-14 NOTE — Progress Notes (Signed)
?Cardiology Office Note:   ? ?Date:  11/14/2021  ? ?ID:  Carlos Diaz, DOB 11-26-1960, MRN [id number] ? ?PCP:  Valera Castle, MD  ?[place] Cardiologist:  Kathlyn Sacramento, MD  ?[facility] Electrophysiologist:  None  ? ?Referring MD: Valera Castle, *  ? ?Chief Complaint: 6 week follow-up ? ?History of Present Illness:   ? ?Carlos Diaz is a 61 y.o. male with a hx of CAD with multiple PCI's as outlined below, HTN, HLD, anxiety, and depression who presents for follow-up of his CAD. ?  ?He was admitted in 2017 with a NSTEMI.  LHC showed high-grade stenosis in the ramus intermedius which was successfully treated with PCI/DES.  He had recurrent angina in 12/2018 with repeat LHC showing a patent ramus stent.  There was high-grade stenosis in a proximal OM branch which was treated with PCI/DES.  He was also noted to have moderate proximal to mid LAD stenosis along with mild RCA disease.  He was seen in 06/2020 with atypical chest pain with subsequent Lexiscan MPI showing evidence of anterior and anteroseptal ischemia with normal EF.  Based on that, he underwent repeat LHC in 06/2020 which demonstrated patent stents in the ramus and OM2 with no significant restenosis.  There was stable moderate proximal LAD and mid RCA disease.  He had a normal LV systolic function and LVEDP.  Medical therapy was recommended.  He was last seen in the office in 12/2020 and was doing well without recurrent chest pain, dyspnea, or palpitations.  He was trying to exercise more.  He did note some fatigue. ?  ?Seen in the ED 09/15/21 for chest pain. BP elevated, but they had the wrong size cuff, BP 140/79, WBC 13.9. HS trop negative x 2. CXR negative. CTA abd/pelvis unremarkable. He was given GI cocktail and discharged.  ? ?Last seen 09/26/21 and reported chest and abdominal pain. He was setup for nuclear treadmill test.  ? ?Today, the stress test was reviewed in detail. He denies further chest pain or SOB. He had a recent  screening for carotid disease screening showing left sided critical stenosis. They called him and let him know he needs evaluation ASAP. Will need repeat carotid US. He denies significant dizziness. Has tinnitus and some confusion. No chest pain or shortness of breath. No LLE, orthopnea, pnd.  ? ?Past Medical History:  ?Diagnosis Date  ? Anxiety   ? Depression   ? GERD (gastroesophageal reflux disease)   ? Hyperlipidemia   ? Hypertension   ? Lyme disease   ? Myocardial infarction ([place]) 04/13/2016  ? ? ?Past Surgical History:  ?Procedure Laterality Date  ? CARDIAC CATHETERIZATION Right 04/14/2016  ? Procedure: Left Heart Cath and Coronary Angiography;  Surgeon: Dionisio Lucien, MD;  Location: [facility];  Service: Cardiovascular;  Laterality: Right;  ? CARDIAC CATHETERIZATION N/A 04/14/2016  ? Procedure: Coronary Stent Intervention;  Surgeon: Yolonda Kida, MD;  Location: [facility];  Service: Cardiovascular;  Laterality: N/A;  ? COLONOSCOPY    ? COLONOSCOPY WITH PROPOFOL N/A 03/26/2020  ? Procedure: COLONOSCOPY WITH PROPOFOL;  Surgeon: Lesly Rubenstein, MD;  Location: [facility] ENDOSCOPY;  Service: Endoscopy;  Laterality: N/A;  ? CORONARY ANGIOPLASTY    ? CORONARY STENT INTERVENTION N/A 12/23/2018  ? Procedure: CORONARY STENT INTERVENTION;  Surgeon: Isaias Cowman, MD;  Location: [place];  Service: Cardiovascular;  Laterality: N/A;  ? LEFT HEART CATH AND CORONARY ANGIOGRAPHY Left 12/23/2018  ? Procedure: LEFT HEART CATH  AND CORONARY ANGIOGRAPHY;  Surgeon: Dionisio Field, MD;  Location: [facility] [place] [facility];  Service: Cardiovascular;  Laterality: Left;  ? LEFT HEART CATH AND CORONARY ANGIOGRAPHY Left 06/25/2020  ? Procedure: LEFT HEART CATH AND CORONARY ANGIOGRAPHY;  Surgeon: Wellington Hampshire, MD;  Location: [facility];  Service: Cardiovascular;  Laterality: Left;  ? ? ?Current Medications: ?Current Meds  ?Medication Sig  ? buPROPion (WELLBUTRIN XL) 300 MG 24 hr tablet  Take 300 mg by mouth daily.  ? citalopram (CELEXA) 40 MG tablet Take 40 mg by mouth daily.  ? clopidogrel (PLAVIX) 75 MG tablet Take 75 mg by mouth daily. Patient is still taking this medication because he has 3 months supply of it.  ? isosorbide mononitrate (IMDUR) 60 MG 24 hr tablet TAKE 1 TABLET BY MOUTH EVERY DAY  ? metoprolol succinate (TOPROL-XL) 25 MG 24 hr tablet TAKE 1 TABLET (25 MG TOTAL) BY MOUTH DAILY.  ? nitroGLYCERIN (NITROSTAT) 0.4 MG SL tablet Place 1 tablet (0.4 mg total) under the tongue every 5 (five) minutes as needed.  ? pantoprazole (PROTONIX) 40 MG tablet Take 1 tablet (40 mg total) by mouth daily.  ? rosuvastatin (CRESTOR) 40 MG tablet TAKE 1 TABLET BY MOUTH EVERY DAY  ?  ? ?Allergies:   Patient has no known allergies.  ? ?Social History  ? ?Socioeconomic History  ? Marital status: Married  ?  Spouse name: Not on file  ? Number of children: Not on file  ? Years of education: Not on file  ? Highest education level: Not on file  ?Occupational History  ? Not on file  ?Tobacco Use  ? Smoking status: Never  ? Smokeless tobacco: Never  ?Vaping Use  ? Vaping Use: Never used  ?Substance and Sexual Activity  ? Alcohol use: No  ? Drug use: No  ? Sexual activity: Not on file  ?Other Topics Concern  ? Not on file  ?Social History Narrative  ? Lives at home with wife and daughter.  ? ?Social Determinants of Health  ? ?Financial Resource Strain: Not on file  ?Food Insecurity: Not on file  ?Transportation Needs: Not on file  ?Physical Activity: Not on file  ?Stress: Not on file  ?Social Connections: Not on file  ?  ? ?Family History: ?The patient's family history includes CAD in his mother. ? ?ROS:   ?Please see the history of present illness.    ? All other systems reviewed and are negative. ? ?EKGs/Labs/Other Studies Reviewed:   ? ?The following studies were reviewed today: ? ?Myoview Lexiscan ?  ?  Findings are consistent with no ischemia. The study is low risk. ?  No ST deviation was noted. ?  LV  perfusion is normal. ?  Left ventricular function is normal. LVEF is 56%. ?  Coronary artery calcification noted in the LAD, Lcx ? ?  ?LHC 06/25/2020: ?The left ventricular systolic function is normal. ?LV end diastolic pressure is normal. ?The left ventricular ejection fraction is 55-65% by visual estimate. ?Prox RCA to Mid RCA lesion is 40% stenosed. ?Prox RCA lesion is 20% stenosed. ?Prox LAD to Mid LAD lesion is 50% stenosed. ?Previously placed Ramus stent (unknown type) is widely patent. ?Previously placed 2nd Mrg-1 stent (unknown type) is widely patent. ?2nd Mrg-2 lesion is 30% stenosed. ?  ?1.  Patent stents in OM 2 and ramus with no significant restenosis.  Stable moderate proximal LAD and mid RCA disease. ?2.  Normal LV systolic function and normal left  ventricular end-diastolic pressure. ?  ?Recommendations: ?Continue aggressive medical therapy. ?__________ ?  ?Lexiscan MPI 06/19/2020: ?Abnormal, potentially high-risk pharmacologic myocardial perfusion stress test. ?There is a large in size, moderate in severity, nearly completely reversible defect involving the anterior and anteroseptal walls that is concerning for ischemia though an element of artifact cannot be excluded. ?The left ventricular ejection fraction is normal (60%). ?Attenuation correction demonstrates coronary stents and mild aortic atherosclerosis. ?__________ ?  ?PCI 12/23/2018: ?Prox LAD lesion is 40% stenosed. ?Ost 1st Mrg lesion is 95% stenosed. ?Mid RCA lesion is 40% stenosed. ?A drug-eluting stent was successfully placed using a Benitez [id number]. ?Post intervention, there is a 0% residual stenosis. ?  ?1.  One-vessel CAD with 95% stenosis proximal OM1 ?2.  Successful PCI with DES proximal OM1 ? __________ ?  ?Diagnostic LHC 12/23/2018: ?Ost 1st Mrg lesion is 95% stenosed. ?Prox LAD lesion is 40% stenosed. ?Mid RCA lesion is 40% stenosed. ?  ?Mild to moderate disease in mid LAD and mid RCA with critical lesion in OM1 in the  proximal portion about 95%.  Advise PCI with drug-eluting stent. ? ?EKG:  EKG is not ordered today.  ?Recent Labs: ?09/25/2021: ALT 20; BUN 15; Creatinine, Ser 1.07; Hemoglobin 13.5; Platelets 183; Potassium 3.7; S

## 2021-11-15 ENCOUNTER — Telehealth: Payer: Self-pay | Admitting: Emergency Medicine

## 2021-11-15 NOTE — Telephone Encounter (Signed)
Called and spoke with patient. Results reviewed with patient, pt verbalized understanding,  questions (if any) answered.   ?

## 2021-11-15 NOTE — Telephone Encounter (Signed)
-----   Message from [place], PA-C sent at 11/15/2021  8:18 AM EDT ----- ?US showed right RCA with 1-39% stenosis, left ICA with 60-79%. I think a vascular referral has already been placed.  ?

## 2021-11-20 ENCOUNTER — Other Ambulatory Visit: Payer: Self-pay | Admitting: Cardiovascular Disease

## 2021-11-21 ENCOUNTER — Encounter (INDEPENDENT_AMBULATORY_CARE_PROVIDER_SITE_OTHER): Payer: Self-pay | Admitting: Vascular Surgery

## 2021-11-21 ENCOUNTER — Ambulatory Visit (INDEPENDENT_AMBULATORY_CARE_PROVIDER_SITE_OTHER): Payer: BC Managed Care – PPO | Admitting: Vascular Surgery

## 2021-11-21 ENCOUNTER — Other Ambulatory Visit: Payer: Self-pay

## 2021-11-21 VITALS — BP 112/71 | HR 60 | Resp 16 | Ht 71.0 in | Wt 234.6 lb

## 2021-11-21 DIAGNOSIS — E782 Mixed hyperlipidemia: Secondary | ICD-10-CM

## 2021-11-21 DIAGNOSIS — I10 Essential (primary) hypertension: Secondary | ICD-10-CM | POA: Diagnosis not present

## 2021-11-21 DIAGNOSIS — I6522 Occlusion and stenosis of left carotid artery: Secondary | ICD-10-CM | POA: Diagnosis not present

## 2021-11-21 DIAGNOSIS — I25118 Atherosclerotic heart disease of native coronary artery with other forms of angina pectoris: Secondary | ICD-10-CM

## 2021-11-21 DIAGNOSIS — I6529 Occlusion and stenosis of unspecified carotid artery: Secondary | ICD-10-CM | POA: Insufficient documentation

## 2021-11-21 NOTE — Progress Notes (Signed)
? ? ?MRN : [id number] ? ?Carlos Diaz is a 61 y.o. (1960/12/10) male who presents with chief complaint of check carotid arteries. ? ?History of Present Illness:  ?The patient is seen for evaluation of carotid stenosis. The carotid stenosis was identified after the patient had a [facility] screening done.  The carotid portion demonstrated peak systolic velocities greater than 300 cm/s on the left with end-diastolic velocities over 673.  This has prompted his referral. ? ?The patient denies amaurosis fugax. There is no recent history of TIA symptoms or focal motor deficits. There is no prior documented CVA. ? ?There is no history of migraine headaches. There is no history of seizures. ? ?The patient is taking enteric-coated aspirin 81 mg daily. ? ?The patient has a history of coronary artery disease and he has had a coronary stent placed, no recent episodes of angina or shortness of breath. ?The patient denies PAD or claudication symptoms. ?There is a history of hyperlipidemia which is being treated with a statin.   ? ?Current Meds  ?Medication Sig  ? buPROPion (WELLBUTRIN XL) 300 MG 24 hr tablet Take 300 mg by mouth daily.  ? citalopram (CELEXA) 40 MG tablet Take 40 mg by mouth daily.  ? clopidogrel (PLAVIX) 75 MG tablet Take 75 mg by mouth daily. Patient is still taking this medication because he has 3 months supply of it.  ? isosorbide mononitrate (IMDUR) 60 MG 24 hr tablet TAKE 1 TABLET BY MOUTH EVERY DAY  ? metoprolol succinate (TOPROL-XL) 25 MG 24 hr tablet TAKE 1 TABLET (25 MG TOTAL) BY MOUTH DAILY.  ? nitroGLYCERIN (NITROSTAT) 0.4 MG SL tablet Place 1 tablet (0.4 mg total) under the tongue every 5 (five) minutes as needed.  ? pantoprazole (PROTONIX) 40 MG tablet Take 1 tablet (40 mg total) by mouth daily.  ? rosuvastatin (CRESTOR) 40 MG tablet TAKE 1 TABLET BY MOUTH EVERY DAY  ? ? ?Past Medical History:  ?Diagnosis Date  ? Anxiety   ? Depression   ? GERD (gastroesophageal reflux disease)   ? Hyperlipidemia   ?  Hypertension   ? Lyme disease   ? Myocardial infarction ([place]) 04/13/2016  ? ? ?Past Surgical History:  ?Procedure Laterality Date  ? CARDIAC CATHETERIZATION Right 04/14/2016  ? Procedure: Left Heart Cath and Coronary Angiography;  Surgeon: Dionisio Roshard, MD;  Location: [facility];  Service: Cardiovascular;  Laterality: Right;  ? CARDIAC CATHETERIZATION N/A 04/14/2016  ? Procedure: Coronary Stent Intervention;  Surgeon: Yolonda Kida, MD;  Location: [facility];  Service: Cardiovascular;  Laterality: N/A;  ? COLONOSCOPY    ? COLONOSCOPY WITH PROPOFOL N/A 03/26/2020  ? Procedure: COLONOSCOPY WITH PROPOFOL;  Surgeon: Lesly Rubenstein, MD;  Location[facility] ENDOSCOPY;  Service: Endoscopy;  Laterality: N/A;  ? CORONARY ANGIOPLASTY    ? CORONARY STENT INTERVENTION N/A 12/23/2018  ? Procedure: CORONARY STENT INTERVENTION;  Surgeon: Isaias Cowman, MD;  Location: [facility];  Service: Cardiovascular;  Laterality: N/A;  ? LEFT HEART CATH AND CORONARY ANGIOGRAPHY Left 12/23/2018  ? Procedure: LEFT HEART CATH AND CORONARY ANGIOGRAPHY;  Surgeon: Dionisio Orange, MD;  Location: [facility];  Service: Cardiovascular;  Laterality: Left;  ? LEFT HEART CATH AND CORONARY ANGIOGRAPHY Left 06/25/2020  ? Procedure: LEFT HEART CATH AND CORONARY ANGIOGRAPHY;  Surgeon: Wellington Hampshire, MD;  Location: [facility];  Service: Cardiovascular;  Laterality: Left;  ? ? ?Social History ?Social History  ? ?Tobacco Use  ? Smoking  status: Never  ? Smokeless tobacco: Never  ?Vaping Use  ? Vaping Use: Never used  ?Substance Use Topics  ? Alcohol use: No  ? Drug use: No  ? ? ?Family History ?Family History  ?Problem Relation Age of Onset  ? CAD Mother   ? ? ?No Known Allergies ? ? ?REVIEW OF SYSTEMS (Negative unless checked) ? ?Constitutional: '[]'$ Weight loss  '[]'$ Fever  '[]'$ Chills ?Cardiac: '[]'$ Chest pain   '[]'$ Chest pressure   '[]'$ Palpitations   '[]'$ Shortness of breath when laying flat   '[]'$ Shortness of breath  with exertion. ?Vascular:  '[]'$ Pain in legs with walking   '[]'$ Pain in legs at rest  '[]'$ History of DVT   '[]'$ Phlebitis   '[]'$ Swelling in legs   '[]'$ Varicose veins   '[]'$ Non-healing ulcers ?Pulmonary:   '[]'$ Uses home oxygen   '[]'$ Productive cough   '[]'$ Hemoptysis   '[]'$ Wheeze  '[]'$ COPD   '[]'$ Asthma ?Neurologic:  '[]'$ Dizziness   '[]'$ Seizures   '[]'$ History of stroke   '[]'$ History of TIA  '[]'$ Aphasia   '[]'$ Vissual changes   '[]'$ Weakness or numbness in arm   '[]'$ Weakness or numbness in leg ?Musculoskeletal:   '[]'$ Joint swelling   '[]'$ Joint pain   '[]'$ Low back pain ?Hematologic:  '[]'$ Easy bruising  '[]'$ Easy bleeding   '[]'$ Hypercoagulable state   '[]'$ Anemic ?Gastrointestinal:  '[]'$ Diarrhea   '[]'$ Vomiting  '[]'$ Gastroesophageal reflux/heartburn   '[]'$ Difficulty swallowing. ?Genitourinary:  '[]'$ Chronic kidney disease   '[]'$ Difficult urination  '[]'$ Frequent urination   '[]'$ Blood in urine ?Skin:  '[]'$ Rashes   '[]'$ Ulcers  ?Psychological:  '[]'$ History of anxiety   '[]'$  History of major depression. ? ?Physical Examination ? ?Vitals:  ? 11/21/21 0838  ?BP: 112/71  ?Pulse: 60  ?Resp: 16  ?Weight: 234 lb 9.6 oz (106.4 kg)  ?Height: '5\' 11"'$  (1.803 m)  ? ?Body mass index is 32.72 kg/m?. ?Gen: WD/WN, NAD ?Head: [place]/AT, No temporalis wasting.  ?Ear/Nose/Throat: Hearing grossly intact, nares w/o erythema or drainage ?Eyes: PER, EOMI, sclera nonicteric.  ?Neck: Supple, no masses.  No bruit or JVD.  ?Pulmonary:  Good air movement, no audible wheezing, no use of accessory muscles.  ?Cardiac: RRR, normal S1, S2, no Murmurs. ?Vascular: Left carotid bruit ?Vessel Right Left  ?Radial Palpable Palpable  ?Carotid Palpable Palpable  ?Gastrointestinal: soft, non-distended. No guarding/no peritoneal signs.  ?Musculoskeletal: M/S 5/5 throughout.  No visible deformity.  ?Neurologic: CN 2-12 intact. Pain and light touch intact in extremities.  Symmetrical.  Speech is fluent. Motor exam as listed above. ?Psychiatric: Judgment intact, Mood & affect appropriate for pt's clinical situation. ?Dermatologic: No rashes or ulcers noted.  No  changes consistent with cellulitis. ? ? ?CBC ?Lab Results  ?Component Value Date  ? WBC 13.9 (H) 09/25/2021  ? HGB 13.5 09/25/2021  ? HCT 39.8 09/25/2021  ? MCV 88.2 09/25/2021  ? PLT 183 09/25/2021  ? ? ?BMET ?   ?Component Value Date/Time  ? NA 135 09/25/2021 1835  ? NA 141 07/19/2020 1209  ? K 3.7 09/25/2021 1835  ? CL 100 09/25/2021 1835  ? CO2 25 09/25/2021 1835  ? GLUCOSE 92 09/25/2021 1835  ? BUN 15 09/25/2021 1835  ? BUN 15 07/19/2020 1209  ? CREATININE 1.07 09/25/2021 1835  ? CALCIUM 9.2 09/25/2021 1835  ? GFRNONAA >60 09/25/2021 1835  ? GFRAA 89 07/19/2020 1209  ? ?CrCl cannot be calculated (Patient's most recent lab result is older than the maximum 21 days allowed.). ? ?COAG ?Lab Results  ?Component Value Date  ? INR 1.06 04/14/2016  ? ? ?Radiology ?NM Myocar Multi W/Spect W/Wall Motion / EF ? ?  Result Date: 11/13/2021 ?  Findings are consistent with no ischemia. The study is low risk.   No ST deviation was noted.   LV perfusion is normal.   Left ventricular function is normal. LVEF is 56%.   Coronary artery calcification noted in the LAD, LCx  ? ?VAS US CAROTID ? ?Result Date: 11/15/2021 ?Carotid Arterial Duplex Study Patient Name:  Carlos Diaz  Date of Exam:   11/14/2021 Medical Rec #: [id number]      Accession #:    [id number] Date of Birth: 1961-02-12       Patient Gender: M Patient Age:   30 years Exam Location:  [place] Procedure:      VAS US CAROTID Referring Phys: Santo Held FURTH --------------------------------------------------------------------------------  Risk Factors:      Hypertension, hyperlipidemia, no history of smoking, prior                    MI, coronary artery disease. Other Factors:     The patient denied all cerebrovascular symptoms. Comparison Study:  No previous exams on record however, the patient had a very                    recent carotid screening exam and was told there was a                    crititcal finding in the LICA Performing Technologist: Pilar Jarvis RDMS, RVT, RDCS   Examination Guidelines: A complete evaluation includes B-mode imaging, spectral Doppler, color Doppler, and power Doppler as needed of all accessible portions of each vessel. Bilateral testing is consi

## 2021-11-23 IMAGING — MR MR HEAD WO/W CM
14 series · 48 of 48 positions shown · IV contrast (10ml Gadavist)
Comparison: None.

CLINICAL DATA: Brain fog

EXAM:
MRI HEAD WITHOUT AND WITH CONTRAST
TECHNIQUE: Multiplanar, multiecho pulse sequences of the brain and surrounding
structures were obtained without and with intravenous contrast.
CONTRAST:  10mL GADAVIST GADOBUTROL 1 MMOL/ML IV SOLN

[Series 5: ax dwi_tracew · axial · 3.0mm · 0.60mm/px · z∈[-76,+79]mm · 4 of 48 slices shown]
[im 1/48]
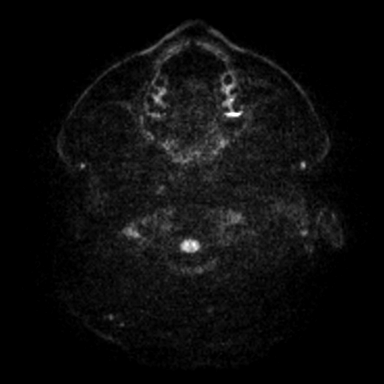
[im 16/48]
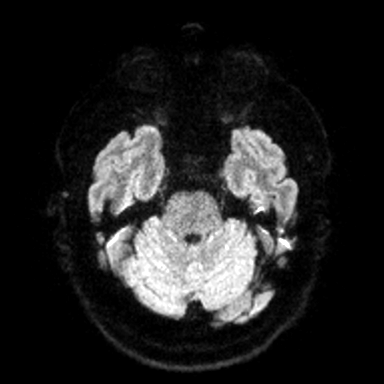
[im 32/48]
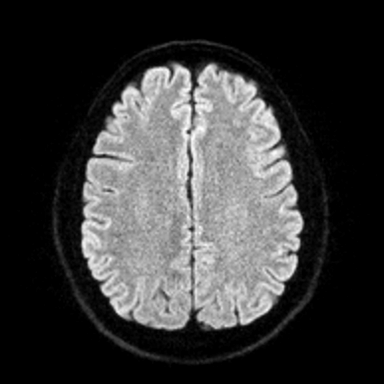
[im 48/48]
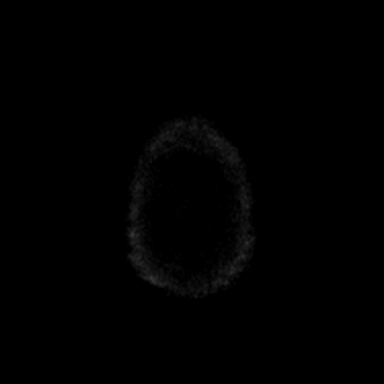

[Series 6: ax dwi_adc · axial · 3.0mm · 0.60mm/px · z∈[-76,+79]mm · 4 of 48 slices shown]
[im 1/48]
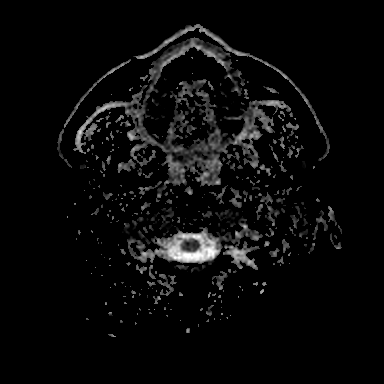
[im 16/48]
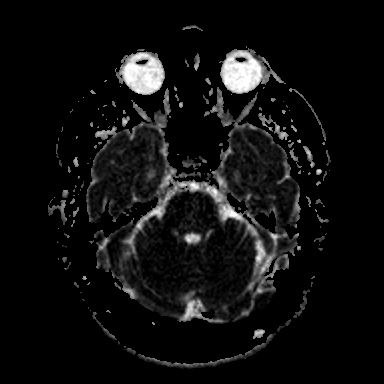
[im 32/48]
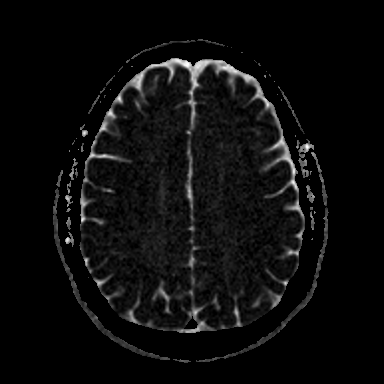
[im 48/48]
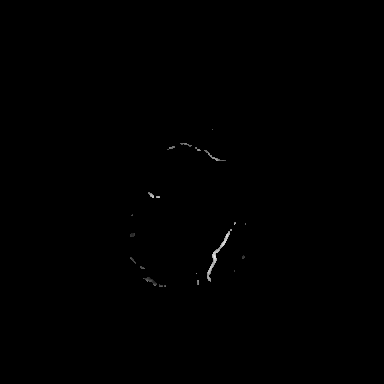

[Series 7: cor dwi_tracew · coronal · 5.0mm · 0.60mm/px · 2 of 38 slices shown]
[im 1/38]
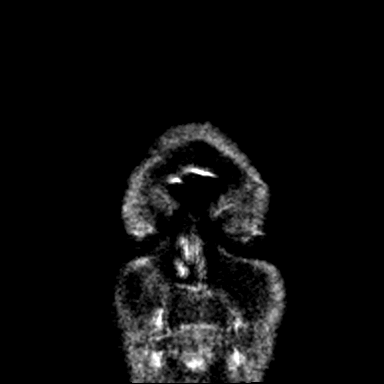
[im 38/38]
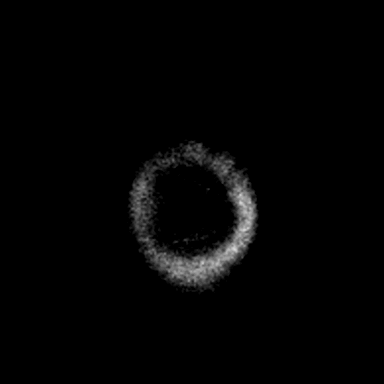

[Series 8: cor dwi_adc · coronal · 5.0mm · 0.60mm/px · 2 of 38 slices shown]
[im 1/38]
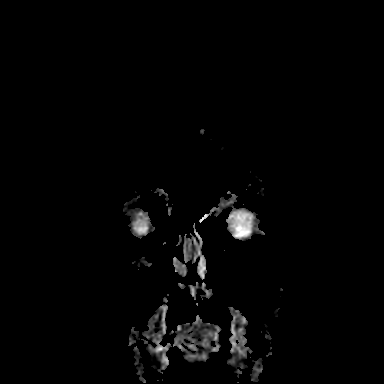
[im 38/38]
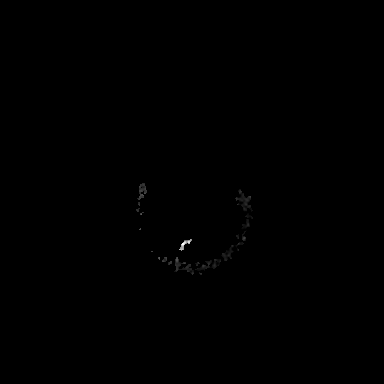

[Series 9: T1 · sagittal · 5.0mm · 0.62mm/px · 1 of 22 slices shown (1 of 2)]
[im 1/22]
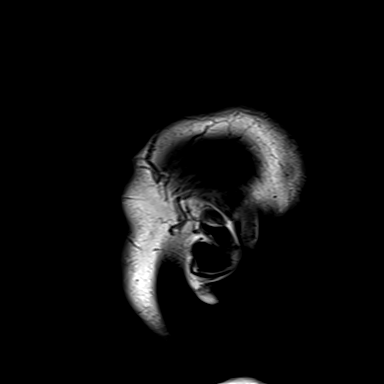

[Series 10: T2 · axial · 5.0mm · 0.53mm/px · 1 of 25 slices shown]
[im 1/25]
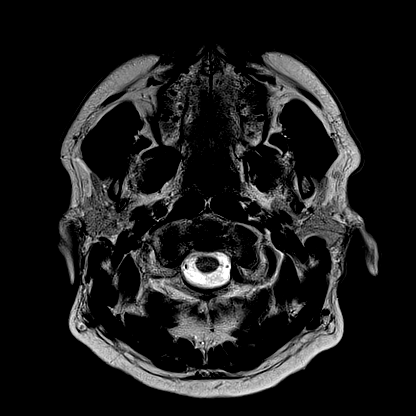

[Series 11: mag_images · axial · 3.0mm · 0.90mm/px · z∈[-87,+90]mm · 3 of 60 slices shown]
[im 1/60]
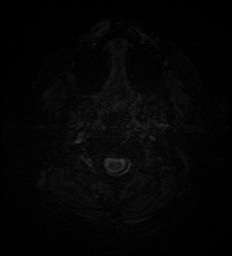
[im 30/60]
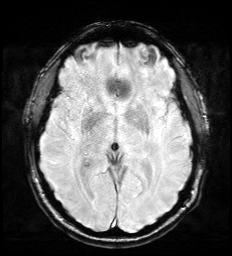
[im 60/60]
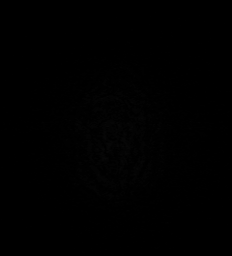

[Series 12: pha_images · axial · 3.0mm · 0.90mm/px · z∈[-87,+90]mm · 3 of 60 slices shown]
[im 1/60]
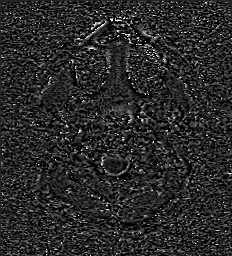
[im 30/60]
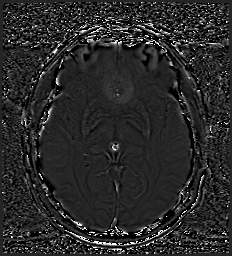
[im 60/60]
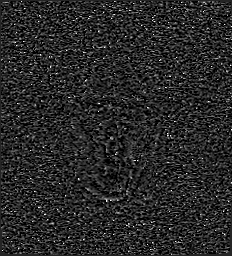

[Series 13: swi_images · axial · 3.0mm · 0.90mm/px · z∈[-87,+90]mm · 3 of 60 slices shown]
[im 1/60]
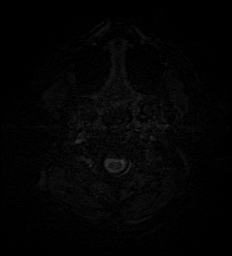
[im 30/60]
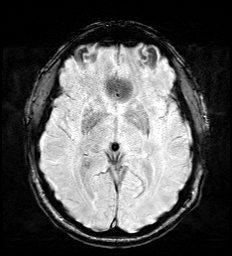
[im 60/60]
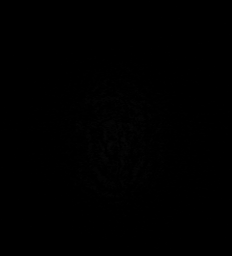

[Series 15: FLAIR · axial · 3.0mm · 0.53mm/px · z∈[-79,+82]mm · 3 of 55 slices shown]
[im 1/55]
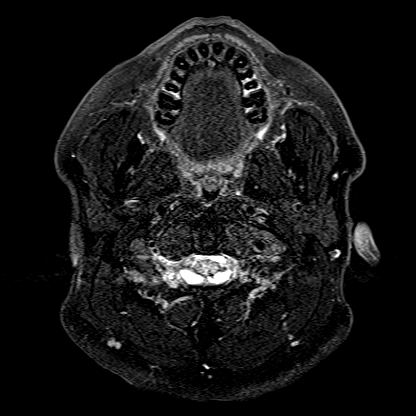
[im 28/55]
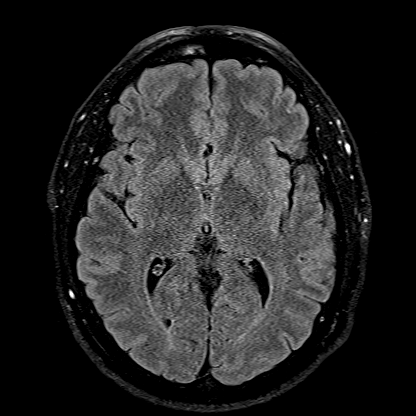
[im 55/55]
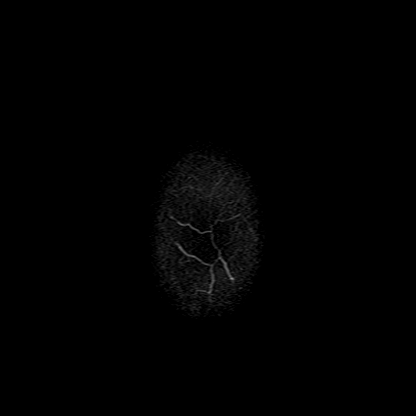

[Series 16: T1 · axial · 1.0mm · 0.98mm/px · z∈[-77,+82]mm · 9 of 160 slices shown (2 of 2)]
[im 1/160]
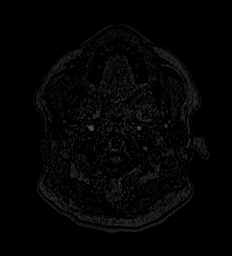
[im 20/160]
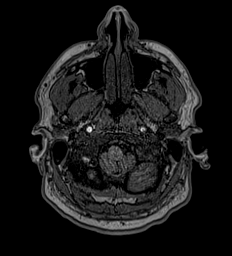
[im 40/160]
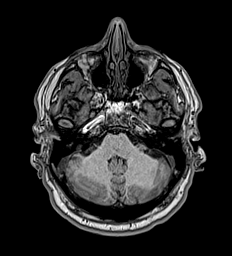
[im 60/160]
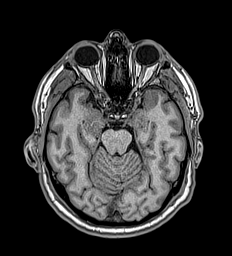
[im 80/160]
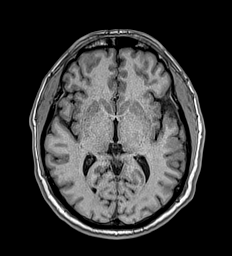
[im 100/160]
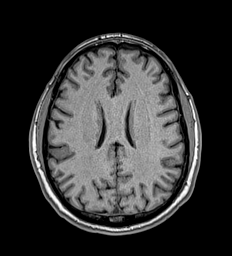
[im 120/160]
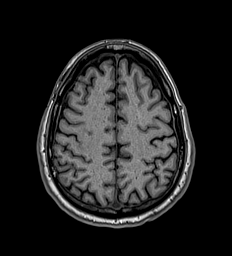
[im 140/160]
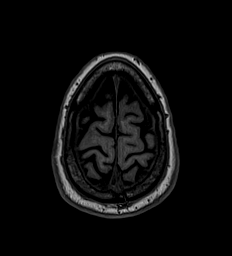
[im 160/160]
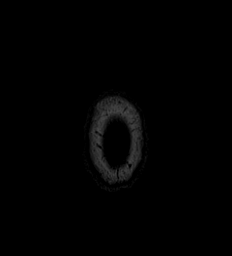

[Series 17: T2 post-contrast · coronal · 5.0mm · 0.57mm/px · 2 of 27 slices shown]
[im 1/27]
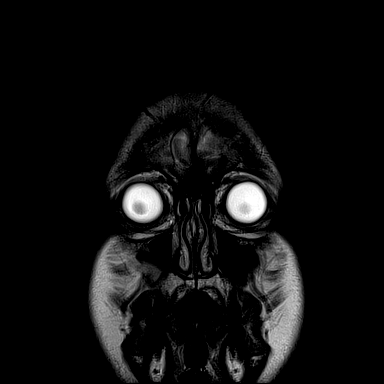
[im 27/27]
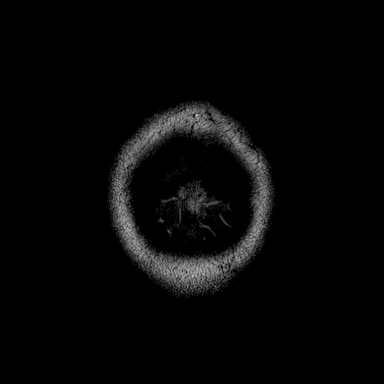

[Series 18: T1 post-contrast · axial · 1.0mm · 0.98mm/px · z∈[-77,+82]mm · 9 of 160 slices shown (1 of 2)]
[im 1/160]
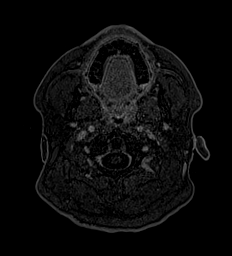
[im 20/160]
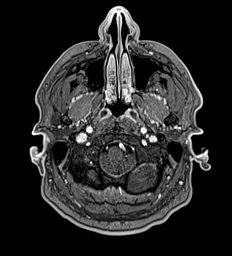
[im 40/160]
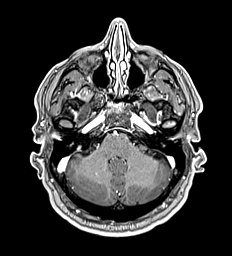
[im 60/160]
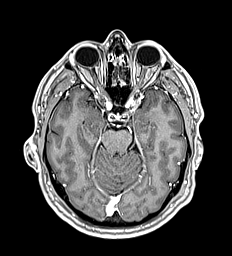
[im 80/160]
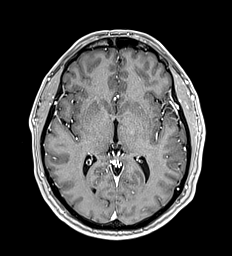
[im 100/160]
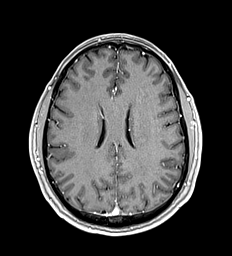
[im 120/160]
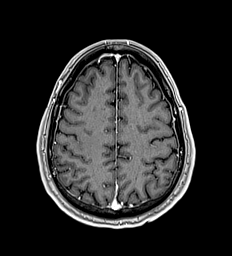
[im 140/160]
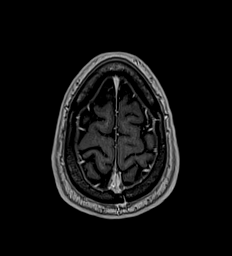
[im 160/160]
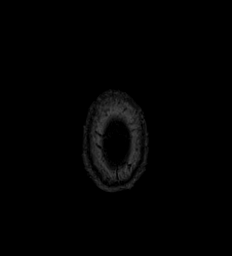

[Series 19: T1 post-contrast · coronal · 5.0mm · 0.57mm/px · 2 of 27 slices shown (2 of 2)]
[im 1/27]
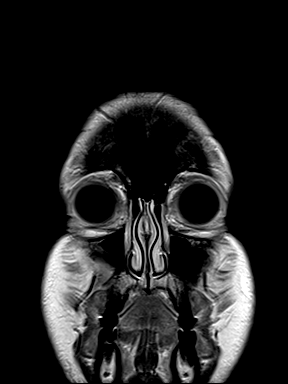
[im 27/27]
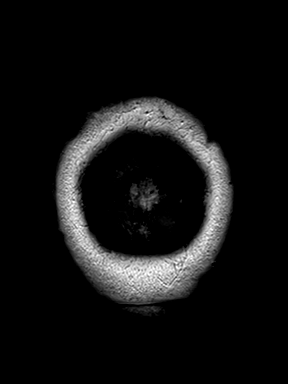

[48 of 48 positions shown; findings below may reference images not displayed]

FINDINGS: Brain: No acute infarction, hemorrhage, hydrocephalus, extra-axial
collection or mass lesion. Normal white matter

Normal enhancement following contrast administration

Vascular: Normal arterial flow voids.

Skull and upper cervical spine: No focal skeletal lesion.

Sinuses/Orbits: Negative

Other: None
IMPRESSION: Normal MRI brain with contrast

## 2021-11-27 ENCOUNTER — Ambulatory Visit: Admission: RE | Admit: 2021-11-27 | Payer: BC Managed Care – PPO | Source: Ambulatory Visit

## 2021-11-27 ENCOUNTER — Telehealth (INDEPENDENT_AMBULATORY_CARE_PROVIDER_SITE_OTHER): Payer: Self-pay | Admitting: Nurse Practitioner

## 2021-11-27 NOTE — Telephone Encounter (Signed)
I spoke with pt to update on the progress of the PA for the CT.  ? ?Pt states that he had stress test etc and then had life screen test. It stated that he had a "--" % blockage. Eulogio Ditch, NP popped into my office and stated to transfer the phone call to her. I did and she spoke with pt. Nothing further needed a this time.  ?

## 2021-12-04 ENCOUNTER — Ambulatory Visit
Admission: RE | Admit: 2021-12-04 | Discharge: 2021-12-04 | Disposition: A | Discharge status: Home / Self Care | Payer: BC Managed Care – PPO | Source: Ambulatory Visit | Attending: Vascular Surgery | Admitting: Vascular Surgery

## 2021-12-04 DIAGNOSIS — M4322 Fusion of spine, cervical region: Secondary | ICD-10-CM | POA: Diagnosis not present

## 2021-12-04 DIAGNOSIS — Z981 Arthrodesis status: Secondary | ICD-10-CM | POA: Diagnosis not present

## 2021-12-04 DIAGNOSIS — I6523 Occlusion and stenosis of bilateral carotid arteries: Secondary | ICD-10-CM | POA: Diagnosis not present

## 2021-12-04 DIAGNOSIS — I6522 Occlusion and stenosis of left carotid artery: Secondary | ICD-10-CM | POA: Diagnosis not present

## 2021-12-04 DIAGNOSIS — M47812 Spondylosis without myelopathy or radiculopathy, cervical region: Secondary | ICD-10-CM | POA: Diagnosis not present

## 2021-12-04 LAB — POCT I-STAT CREATININE: Creatinine, Ser: 1.3 mg/dL — ABNORMAL HIGH (ref 0.61–1.24)

## 2021-12-04 MED ORDER — IOHEXOL 350 MG/ML SOLN
75.0000 mL | Freq: Once | INTRAVENOUS | Status: AC | PRN
  Administered 2021-12-04: 75 mL via INTRAVENOUS

## 2021-12-05 ENCOUNTER — Ambulatory Visit: Payer: BC Managed Care – PPO | Admitting: Physician Assistant

## 2021-12-05 ENCOUNTER — Other Ambulatory Visit: Payer: BC Managed Care – PPO

## 2021-12-09 ENCOUNTER — Encounter (INDEPENDENT_AMBULATORY_CARE_PROVIDER_SITE_OTHER): Payer: Self-pay | Admitting: Nurse Practitioner

## 2021-12-09 ENCOUNTER — Ambulatory Visit (INDEPENDENT_AMBULATORY_CARE_PROVIDER_SITE_OTHER): Payer: BC Managed Care – PPO | Admitting: Nurse Practitioner

## 2021-12-09 VITALS — BP 154/92 | HR 63 | Resp 16 | Wt 240.0 lb

## 2021-12-09 DIAGNOSIS — I6522 Occlusion and stenosis of left carotid artery: Secondary | ICD-10-CM | POA: Diagnosis not present

## 2021-12-09 DIAGNOSIS — E782 Mixed hyperlipidemia: Secondary | ICD-10-CM

## 2021-12-09 DIAGNOSIS — I10 Essential (primary) hypertension: Secondary | ICD-10-CM | POA: Diagnosis not present

## 2021-12-12 NOTE — Progress Notes (Signed)
? ?Cardiology Office Note   ? ?Date:  12/13/2021  ? ?ID:  Carlos Diaz, DOB 03-02-1961, MRN [id number] ? ?PCP:  Valera Castle, MD  ?Cardiologist:  Kathlyn Sacramento, MD  ?Electrophysiologist:  None  ? ?Chief Complaint: Preoperative cardiac risk stratification ? ?History of Present Illness:  ? ?Carlos Diaz is a 61 y.o. male with history of CAD with multiple PCI's as outlined below, HTN, HLD, carotid artery disease, anxiety, and depression who presents for preoperative cardiac risk stratification. ?  ?He was admitted in 2017 with a NSTEMI.  LHC showed high-grade stenosis in the ramus intermedius which was successfully treated with PCI/DES.  He had recurrent angina in 12/2018 with repeat LHC showing a patent ramus stent.  There was high-grade stenosis in a proximal OM branch which was treated with PCI/DES.  He was also noted to have moderate proximal to mid LAD stenosis along with mild RCA disease.  He was seen in 06/2020 with atypical chest pain with subsequent Lexiscan MPI showing evidence of anterior and anteroseptal ischemia with normal EF.  Based on that, he underwent repeat LHC in 06/2020 which demonstrated patent stents in the ramus and OM2 with no significant restenosis.  There was stable moderate proximal LAD and mid RCA disease.  He had a normal LV systolic function and LVEDP.  Medical therapy was recommended.  He was seen in the ED in 09/2021 with chest an abdominal pain and elevated BP (felt to be related to wrong BP cuff size).  High-sensitivity troponin negative x2.  CT of the abdomen/pelvis was unrevealing.  He was treated with GI cocktail.  He subsequently underwent Lexiscan MPI in 10/2021 which showed no evidence of ischemia and was overall low risk with a normal LVEF.  Coronary artery calcification was noted in the LAD and LCx.  He had a screening carotid exam which reportedly demonstrated left-sided "critical stenosis."  Subsequent carotid artery ultrasound on 11/14/2020 demonstrated 1 to 39% RICA  stenosis with less than 50% RECA stenosis, 60 to 63% LICA stenosis with less than 50% LECA stenosis, bilateral antegrade flow within the vertebral arteries, and normal flow hemodynamics in the bilateral subclavian arteries.  He was subsequently evaluated by vascular surgery on 11/21/2021 with CTA of the neck on 12/04/2021 demonstrated proximal LICA stenosis of 70 to 80% with mild plaquing in the proximal RICA without hemodynamically significant stenosis.  Incidentally, there was a cluster of nodules in the right upper lobe measuring up to 6 mm that were possibly infectious versus inflammatory with recommendation of follow-up chest CT in 3 months. ? ?He comes in accompanied by his wife today and is doing well from a cardiac perspective, without symptoms of angina or decompensation.  He remains active, exercising regularly, without cardiac limitation.  No symptoms of dizziness, presyncope, or syncope.  No palpitations, dyspnea, lower extremity swelling, orthopnea.  He is tolerating cardiac medications without issues.  Vascular surgery is planning for repair of LICA stenosis. ? ? ?Labs independently reviewed: ?09/2021 - albumin 4.3, AST/ALT normal, Hgb 13.5, PLT 183, potassium 3.7, BUN 15, serum creatinine 1.07 ?11/2020 - TC 112, TG 130, HDL 32, LDL 54 ?07/2020 - magnesium 2.1, TSH normal ?12/2018 - A1c 5.5 ? ?Past Medical History:  ?Diagnosis Date  ? Anxiety   ? Depression   ? GERD (gastroesophageal reflux disease)   ? Hyperlipidemia   ? Hypertension   ? Lyme disease   ? Myocardial infarction ([place] [facility]) 04/13/2016  ? ? ?Past Surgical History:  ?Procedure Laterality Date  ? CARDIAC  CATHETERIZATION Right 04/14/2016  ? Procedure: Left Heart Cath and Coronary Angiography;  Surgeon: Dionisio Braydin, MD;  Location: [place] [facility];  Service: Cardiovascular;  Laterality: Right;  ? CARDIAC CATHETERIZATION N/A 04/14/2016  ? Procedure: Coronary Stent Intervention;  Surgeon: Yolonda Kida, MD;  Location: [place] [facility];   Service: Cardiovascular;  Laterality: N/A;  ? COLONOSCOPY    ? COLONOSCOPY WITH PROPOFOL N/A 03/26/2020  ? Procedure: COLONOSCOPY WITH PROPOFOL;  Surgeon: Lesly Rubenstein, MD;  Location: [facility];  Service: Endoscopy;  Laterality: N/A;  ? CORONARY ANGIOPLASTY    ? CORONARY STENT INTERVENTION N/A 12/23/2018  ? Procedure: CORONARY STENT INTERVENTION;  Surgeon: Isaias Cowman, MD;  Location: [place] [facility];  Service: Cardiovascular;  Laterality: N/A;  ? LEFT HEART CATH AND CORONARY ANGIOGRAPHY Left 12/23/2018  ? Procedure: LEFT HEART CATH AND CORONARY ANGIOGRAPHY;  Surgeon: Dionisio Omar, MD;  Location: [place] [facility];  Service: Cardiovascular;  Laterality: Left;  ? LEFT HEART CATH AND CORONARY ANGIOGRAPHY Left 06/25/2020  ? Procedure: LEFT HEART CATH AND CORONARY ANGIOGRAPHY;  Surgeon: Wellington Hampshire, MD;  Location: [place] [facility];  Service: Cardiovascular;  Laterality: Left;  ? ? ?Current Medications: ?Current Meds  ?Medication Sig  ? aspirin EC 81 MG tablet Take 1 tablet (81 mg total) by mouth daily. Swallow whole.  ? buPROPion (WELLBUTRIN XL) 300 MG 24 hr tablet Take 300 mg by mouth daily.  ? citalopram (CELEXA) 40 MG tablet Take 40 mg by mouth daily.  ? clopidogrel (PLAVIX) 75 MG tablet Take 75 mg by mouth daily. Patient is still taking this medication because he has 3 months supply of it.  ? isosorbide mononitrate (IMDUR) 60 MG 24 hr tablet TAKE 1 TABLET BY MOUTH EVERY DAY  ? metoprolol succinate (TOPROL-XL) 25 MG 24 hr tablet TAKE 1 TABLET (25 MG TOTAL) BY MOUTH DAILY.  ? nitroGLYCERIN (NITROSTAT) 0.4 MG SL tablet Place 1 tablet (0.4 mg total) under the tongue every 5 (five) minutes as needed.  ? pantoprazole (PROTONIX) 40 MG tablet Take 1 tablet (40 mg total) by mouth daily.  ? rosuvastatin (CRESTOR) 40 MG tablet TAKE 1 TABLET BY MOUTH EVERY DAY  ? ? ?Allergies:   Patient has no known allergies.  ? ?Social History  ? ?Socioeconomic History  ? Marital status: Married  ?   Spouse name: Not on file  ? Number of children: Not on file  ? Years of education: Not on file  ? Highest education level: Not on file  ?Occupational History  ? Not on file  ?Tobacco Use  ? Smoking status: Never  ? Smokeless tobacco: Never  ?Vaping Use  ? Vaping Use: Never used  ?Substance and Sexual Activity  ? Alcohol use: No  ? Drug use: No  ? Sexual activity: Not on file  ?Other Topics Concern  ? Not on file  ?Social History Narrative  ? Lives at home with wife and daughter.  ? ?Social Determinants of Health  ? ?Financial Resource Strain: Not on file  ?Food Insecurity: Not on file  ?Transportation Needs: Not on file  ?Physical Activity: Not on file  ?Stress: Not on file  ?Social Connections: Not on file  ?  ? ?Family History:  ?The patient's family history includes CAD in his mother. ? ?ROS:   ?12 point review of system is negative as otherwise noted in the HPI. ? ? ?EKGs/Labs/Other Studies Reviewed:   ? ?Studies reviewed were summarized above. The additional  studies were reviewed today: ? ?Carotid artery ultrasound 11/14/2021: ?Summary:  ?Right Carotid: Velocities in the right ICA are consistent with a 1-39%  ?stenosis.  Non-hemodynamically significant plaque <50% noted in the  ?CCA. The ECA appears <50% stenosed.  ? ?Left Carotid: Velocities in the left ICA are consistent with a 60-79%  ?stenosis.  Non-hemodynamically significant plaque <50% noted in the  ?CCA. The ECA appears <50% stenosed.  ? ?Vertebrals:  Bilateral vertebral arteries demonstrate antegrade flow.  ?Subclavians: Normal flow hemodynamics were seen in bilateral subclavian arteries.  ?__________ ? ?Lexiscan MPI 11/13/2021: ?  Findings are consistent with no ischemia. The study is low risk. ?  No ST deviation was noted. ?  LV perfusion is normal. ?  Left ventricular function is normal. LVEF is 56%. ?  Coronary artery calcification noted in the LAD, LCx ?__________ ? ?LHC 06/25/2020: ?The left ventricular systolic function is normal. ?LV end  diastolic pressure is normal. ?The left ventricular ejection fraction is 55-65% by visual estimate. ?Prox RCA to Mid RCA lesion is 40% stenosed. ?Prox RCA lesion is 20% stenosed. ?Prox LAD to Mid LAD lesion is 50% stenos

## 2021-12-13 ENCOUNTER — Encounter: Payer: Self-pay | Admitting: Physician Assistant

## 2021-12-13 ENCOUNTER — Ambulatory Visit: Payer: BC Managed Care – PPO | Admitting: Physician Assistant

## 2021-12-13 VITALS — BP 128/86 | HR 53 | Ht 71.0 in | Wt 234.0 lb

## 2021-12-13 DIAGNOSIS — Z01810 Encounter for preprocedural cardiovascular examination: Secondary | ICD-10-CM | POA: Diagnosis not present

## 2021-12-13 DIAGNOSIS — I2510 Atherosclerotic heart disease of native coronary artery without angina pectoris: Secondary | ICD-10-CM | POA: Diagnosis not present

## 2021-12-13 DIAGNOSIS — E785 Hyperlipidemia, unspecified: Secondary | ICD-10-CM | POA: Diagnosis not present

## 2021-12-13 DIAGNOSIS — I6522 Occlusion and stenosis of left carotid artery: Secondary | ICD-10-CM

## 2021-12-13 DIAGNOSIS — I10 Essential (primary) hypertension: Secondary | ICD-10-CM | POA: Diagnosis not present

## 2021-12-13 DIAGNOSIS — R918 Other nonspecific abnormal finding of lung field: Secondary | ICD-10-CM

## 2021-12-13 NOTE — Progress Notes (Signed)
Provider requested an order after patient checked out. Verbal order received for CT chest w/o contract to follow up on pulmonary nodules. Patient called and provided instructions to schedule AFTER July 5 th and number to call and schedule. He verbalized understanding with no further questions at this time.  ?

## 2021-12-13 NOTE — Patient Instructions (Signed)
Medication Instructions:  ?No changes at this time ? ?*If you need a refill on your cardiac medications before your next appointment, please call your pharmacy* ? ? ?Lab Work: ?None ? ?If you have labs (blood work) drawn today and your tests are completely normal, you will receive your results only by: ?MyChart Message (if you have MyChart) OR ?A paper copy in the mail ?If you have any lab test that is abnormal or we need to change your treatment, we will call you to review the results. ? ? ?Testing/Procedures: ?None ? ? ?Follow-Up: ?At [facility], you and your health needs are our priority.  As part of our continuing mission to provide you with exceptional heart care, we have created designated Provider Care Teams.  These Care Teams include your primary Cardiologist (physician) and Advanced Practice Providers (APPs -  Physician Assistants and Nurse Practitioners) who all work together to provide you with the care you need, when you need it. ? ?We recommend signing up for the patient portal called "MyChart".  Sign up information is provided on this After Visit Summary.  MyChart is used to connect with patients for Virtual Visits (Telemedicine).  Patients are able to view lab/test results, encounter notes, upcoming appointments, etc.  Non-urgent messages can be sent to your provider as well.   ?To learn more about what you can do with MyChart, go to [email or web address].   ? ?Your next appointment:   ?6 month(s) ? ?The format for your next appointment:   ?In Person ? ?Provider:   ?Kathlyn Sacramento, MD or Christell Faith, PA-C ? ? ? ?Important Information About Sugar ? ? ? ? ?  ?

## 2021-12-13 NOTE — Addendum Note (Signed)
Addended by: Valora Corporal on: 12/13/2021 04:11 PM ? ? Modules accepted: Orders ? ?

## 2021-12-16 ENCOUNTER — Encounter (INDEPENDENT_AMBULATORY_CARE_PROVIDER_SITE_OTHER): Payer: Self-pay | Admitting: Nurse Practitioner

## 2021-12-16 NOTE — Progress Notes (Addendum)
? ?Subjective:  ? ? Patient ID: Carlos Diaz, male    DOB: Jun 07, 1961, 61 y.o.   MRN: [id number] ?Chief Complaint  ?Patient presents with  ? Follow-up  ?  Ct results  ? ? ?Carlos Diaz is a 61 year old male that is seen for follow up evaluation of carotid stenosis status post CT angiogram. CT scan was done 12/04/2021. Patient reports that the test went well with no problems or complications.  ? ?The patient denies interval amaurosis fugax. There is no recent or interval TIA symptoms or focal motor deficits. There is no prior documented CVA. ? ?The patient is taking enteric-coated aspirin 81 mg daily. ? ?There is no history of migraine headaches. There is no history of seizures. ? ?No recent shortening of the patient's walking distance or new symptoms consistent with claudication.  No history of rest pain symptoms. No new ulcers or wounds of the lower extremities have occurred. ? ?There is no history of DVT, PE or superficial thrombophlebitis. ?No recent episodes of angina or shortness of breath documented.  ? ?CT angiogram is reviewed by me personally and shows 80% stenosis consistent with calcified plaque at the origin of the left internal carotid artery.   ? ? ?Review of Systems  ?Eyes:  Negative for visual disturbance.  ?All other systems reviewed and are negative. ? ?   ?Objective:  ? Physical Exam ?Vitals reviewed.  ?HENT:  ?   Head: Normocephalic.  ?Cardiovascular:  ?   Rate and Rhythm: Normal rate.  ?   Pulses: Normal pulses.  ?Pulmonary:  ?   Effort: Pulmonary effort is normal.  ?Skin: ?   General: Skin is warm and dry.  ?Neurological:  ?   Mental Status: He is alert and oriented to person, place, and time.  ?Psychiatric:     ?   Mood and Affect: Mood normal.     ?   Behavior: Behavior normal.     ?   Thought Content: Thought content normal.     ?   Judgment: Judgment normal.  ? ? ?BP (!) 154/92 (BP Location: Left Arm)   Pulse 63   Resp 16   Wt 240 lb (108.9 kg)   BMI 33.47 kg/m?  ? ?Past Medical  History:  ?Diagnosis Date  ? Anxiety   ? Depression   ? GERD (gastroesophageal reflux disease)   ? Hyperlipidemia   ? Hypertension   ? Lyme disease   ? Myocardial infarction ([place]) 04/13/2016  ? ? ?Social History  ? ?Socioeconomic History  ? Marital status: Married  ?  Spouse name: Not on file  ? Number of children: Not on file  ? Years of education: Not on file  ? Highest education level: Not on file  ?Occupational History  ? Not on file  ?Tobacco Use  ? Smoking status: Never  ? Smokeless tobacco: Never  ?Vaping Use  ? Vaping Use: Never used  ?Substance and Sexual Activity  ? Alcohol use: No  ? Drug use: No  ? Sexual activity: Not on file  ?Other Topics Concern  ? Not on file  ?Social History Narrative  ? Lives at home with wife and daughter.  ? ?Social Determinants of Health  ? ?Financial Resource Strain: Not on file  ?Food Insecurity: Not on file  ?Transportation Needs: Not on file  ?Physical Activity: Not on file  ?Stress: Not on file  ?Social Connections: Not on file  ?Intimate Partner Violence: Not on file  ? ? ?Past Surgical  History:  ?Procedure Laterality Date  ? CARDIAC CATHETERIZATION Right 04/14/2016  ? Procedure: Left Heart Cath and Coronary Angiography;  Surgeon: Dionisio Oswald, MD;  Location: [place] [facility];  Service: Cardiovascular;  Laterality: Right;  ? CARDIAC CATHETERIZATION N/A 04/14/2016  ? Procedure: Coronary Stent Intervention;  Surgeon: Yolonda Kida, MD;  Location: [place] [facility];  Service: Cardiovascular;  Laterality: N/A;  ? COLONOSCOPY    ? COLONOSCOPY WITH PROPOFOL N/A 03/26/2020  ? Procedure: COLONOSCOPY WITH PROPOFOL;  Surgeon: Lesly Rubenstein, MD;  Location: [facility]COPY;  Service: Endoscopy;  Laterality: N/A;  ? CORONARY ANGIOPLASTY    ? CORONARY STENT INTERVENTION N/A 12/23/2018  ? Procedure: CORONARY STENT INTERVENTION;  Surgeon: Isaias Cowman, MD;  Location: [place] [facility];  Service: Cardiovascular;  Laterality: N/A;  ? LEFT HEART CATH AND CORONARY  ANGIOGRAPHY Left 12/23/2018  ? Procedure: LEFT HEART CATH AND CORONARY ANGIOGRAPHY;  Surgeon: Dionisio Fidel, MD;  Location: [place] [facility];  Service: Cardiovascular;  Laterality: Left;  ? LEFT HEART CATH AND CORONARY ANGIOGRAPHY Left 06/25/2020  ? Procedure: LEFT HEART CATH AND CORONARY ANGIOGRAPHY;  Surgeon: Wellington Hampshire, MD;  Location: [place] [facility];  Service: Cardiovascular;  Laterality: Left;  ? ? ?Family History  ?Problem Relation Age of Onset  ? CAD Mother   ? ? ?No Known Allergies ? ? ?  Latest Ref Rng & Units 09/25/2021  ?  6:35 PM 06/22/2020  ? 12:12 PM 12/24/2018  ?  3:21 AM  ?CBC  ?WBC 4.0 - 10.5 K/uL 13.9   8.1   8.7    ?Hemoglobin 13.0 - 17.0 g/dL 13.5   13.1   12.0    ?Hematocrit 39.0 - 52.0 % 39.8   37.5   36.1    ?Platelets 150 - 400 K/uL 183   177   166    ? ? ? ? ?CMP  ?   ?Component Value Date/Time  ? NA 135 09/25/2021 1835  ? NA 141 07/19/2020 1209  ? K 3.7 09/25/2021 1835  ? CL 100 09/25/2021 1835  ? CO2 25 09/25/2021 1835  ? GLUCOSE 92 09/25/2021 1835  ? BUN 15 09/25/2021 1835  ? BUN 15 07/19/2020 1209  ? CREATININE 1.30 (H) 12/04/2021 1520  ? CALCIUM 9.2 09/25/2021 1835  ? PROT 7.2 09/25/2021 1850  ? PROT 6.7 04/01/2018 0803  ? ALBUMIN 4.3 09/25/2021 1850  ? ALBUMIN 4.4 04/01/2018 0803  ? AST 19 09/25/2021 1850  ? ALT 20 09/25/2021 1850  ? ALKPHOS 42 09/25/2021 1850  ? BILITOT 1.0 09/25/2021 1850  ? BILITOT 0.6 04/01/2018 0803  ? GFRNONAA >60 09/25/2021 1835  ? GFRAA 89 07/19/2020 1209  ? ? ? ?No results found. ? ?   ?Assessment & Plan:  ? ?1. Stenosis of left carotid artery ?Recommend: ? ?The patient remains asymptomatic with respect to the carotid stenosis.  However, the patient has now progressed and has a lesion the is >75%. ? ?Patient's CT angiography of the carotid arteries confirms >75% left ICA stenosis.  The anatomical considerations support surgery over stenting.  This was discussed in detail with the patient. ? ?The patient does indeed need surgery, therefore,  cardiac clearance will be arranged. Once cleared the patient will be scheduled for surgery. ? ?The risks, benefits and alternative therapies were reviewed in detail with the patient.  All questions were answered.  The patient agrees to proceed with surgery of the left carotid artery. ? ?Continue antiplatelet therapy  as prescribed. ?Continue management of CAD, HTN and Hyperlipidemia. ?Healthy heart diet, encouraged exercise at least 4 times per week.   ? ?2. Primary hypertension ?Continue antihypertensive medications as already ordered, these medications have been reviewed and there are no changes at this time.  ? ?3. Mixed hyperlipidemia ?Continue statin as ordered and reviewed, no changes at this time  ? ? ?Current Outpatient Medications on File Prior to Visit  ?Medication Sig Dispense Refill  ? buPROPion (WELLBUTRIN XL) 300 MG 24 hr tablet Take 300 mg by mouth daily.    ? citalopram (CELEXA) 40 MG tablet Take 40 mg by mouth daily.    ? clopidogrel (PLAVIX) 75 MG tablet Take 75 mg by mouth daily. Patient is still taking this medication because he has 3 months supply of it.    ? isosorbide mononitrate (IMDUR) 60 MG 24 hr tablet TAKE 1 TABLET BY MOUTH EVERY DAY 90 tablet 0  ? metoprolol succinate (TOPROL-XL) 25 MG 24 hr tablet TAKE 1 TABLET (25 MG TOTAL) BY MOUTH DAILY. 90 tablet 0  ? nitroGLYCERIN (NITROSTAT) 0.4 MG SL tablet Place 1 tablet (0.4 mg total) under the tongue every 5 (five) minutes as needed. 25 tablet 3  ? pantoprazole (PROTONIX) 40 MG tablet Take 1 tablet (40 mg total) by mouth daily. 30 tablet 0  ? rosuvastatin (CRESTOR) 40 MG tablet TAKE 1 TABLET BY MOUTH EVERY DAY 90 tablet 2  ? aspirin EC 81 MG tablet Take 1 tablet (81 mg total) by mouth daily. Swallow whole. 90 tablet 3  ? sucralfate (CARAFATE) 1 g tablet Take 1 tablet (1 g total) by mouth 4 (four) times daily for 5 days. 20 tablet 0  ? ?No current facility-administered medications on file prior to visit.  ? ? ?There are no Patient Instructions  on file for this visit. ?No follow-ups on file. ? ? ?Kris Hartmann, NP ? ? ?

## 2021-12-16 NOTE — H&P (View-Only) (Signed)
? ?Subjective:  ? ? Patient ID: Carlos Diaz, male    DOB: May 23, 1961, 61 y.o.   MRN: [id number] ?Chief Complaint  ?Patient presents with  ? Follow-up  ?  Ct results  ? ? ?Carlos Diaz is a 61 year old male that is seen for follow up evaluation of carotid stenosis status post CT angiogram. CT scan was done 12/04/2021. Patient reports that the test went well with no problems or complications.  ? ?The patient denies interval amaurosis fugax. There is no recent or interval TIA symptoms or focal motor deficits. There is no prior documented CVA. ? ?The patient is taking enteric-coated aspirin 81 mg daily. ? ?There is no history of migraine headaches. There is no history of seizures. ? ?No recent shortening of the patient's walking distance or new symptoms consistent with claudication.  No history of rest pain symptoms. No new ulcers or wounds of the lower extremities have occurred. ? ?There is no history of DVT, PE or superficial thrombophlebitis. ?No recent episodes of angina or shortness of breath documented.  ? ?CT angiogram is reviewed by me personally and shows 80% stenosis consistent with calcified plaque at the origin of the left internal carotid artery.   ? ? ?Review of Systems  ?Eyes:  Negative for visual disturbance.  ?All other systems reviewed and are negative. ? ?   ?Objective:  ? Physical Exam ?Vitals reviewed.  ?HENT:  ?   Head: Normocephalic.  ?Cardiovascular:  ?   Rate and Rhythm: Normal rate.  ?   Pulses: Normal pulses.  ?Pulmonary:  ?   Effort: Pulmonary effort is normal.  ?Skin: ?   General: Skin is warm and dry.  ?Neurological:  ?   Mental Status: He is alert and oriented to person, place, and time.  ?Psychiatric:     ?   Mood and Affect: Mood normal.     ?   Behavior: Behavior normal.     ?   Thought Content: Thought content normal.     ?   Judgment: Judgment normal.  ? ? ?BP (!) 154/92 (BP Location: Left Arm)   Pulse 63   Resp 16   Wt 240 lb (108.9 kg)   BMI 33.47 kg/m?  ? ?Past Medical  History:  ?Diagnosis Date  ? Anxiety   ? Depression   ? GERD (gastroesophageal reflux disease)   ? Hyperlipidemia   ? Hypertension   ? Lyme disease   ? Myocardial infarction ([place]) 04/13/2016  ? ? ?Social History  ? ?Socioeconomic History  ? Marital status: Married  ?  Spouse name: Not on file  ? Number of children: Not on file  ? Years of education: Not on file  ? Highest education level: Not on file  ?Occupational History  ? Not on file  ?Tobacco Use  ? Smoking status: Never  ? Smokeless tobacco: Never  ?Vaping Use  ? Vaping Use: Never used  ?Substance and Sexual Activity  ? Alcohol use: No  ? Drug use: No  ? Sexual activity: Not on file  ?Other Topics Concern  ? Not on file  ?Social History Narrative  ? Lives at home with wife and daughter.  ? ?Social Determinants of Health  ? ?Financial Resource Strain: Not on file  ?Food Insecurity: Not on file  ?Transportation Needs: Not on file  ?Physical Activity: Not on file  ?Stress: Not on file  ?Social Connections: Not on file  ?Intimate Partner Violence: Not on file  ? ? ?Past Surgical  History:  ?Procedure Laterality Date  ? CARDIAC CATHETERIZATION Right 04/14/2016  ? Procedure: Left Heart Cath and Coronary Angiography;  Surgeon: Dionisio Benson, MD;  Location: [facility];  Service: Cardiovascular;  Laterality: Right;  ? CARDIAC CATHETERIZATION N/A 04/14/2016  ? Procedure: Coronary Stent Intervention;  Surgeon: Yolonda Kida, MD;  Location: [facility];  Service: Cardiovascular;  Laterality: N/A;  ? COLONOSCOPY    ? COLONOSCOPY WITH PROPOFOL N/A 03/26/2020  ? Procedure: COLONOSCOPY WITH PROPOFOL;  Surgeon: Lesly Rubenstein, MD;  Location: [facility];  Service: Endoscopy;  Laterality: N/A;  ? CORONARY ANGIOPLASTY    ? CORONARY STENT INTERVENTION N/A 12/23/2018  ? Procedure: CORONARY STENT INTERVENTION;  Surgeon: Isaias Cowman, MD;  Location: [facility];  Service: Cardiovascular;  Laterality: N/A;  ? LEFT HEART CATH AND CORONARY  ANGIOGRAPHY Left 12/23/2018  ? Procedure: LEFT HEART CATH AND CORONARY ANGIOGRAPHY;  Surgeon: Dionisio Letrell, MD;  Location: [facility];  Service: Cardiovascular;  Laterality: Left;  ? LEFT HEART CATH AND CORONARY ANGIOGRAPHY Left 06/25/2020  ? Procedure: LEFT HEART CATH AND CORONARY ANGIOGRAPHY;  Surgeon: Wellington Hampshire, MD;  Location: [facility];  Service: Cardiovascular;  Laterality: Left;  ? ? ?Family History  ?Problem Relation Age of Onset  ? CAD Mother   ? ? ?No Known Allergies ? ? ?  Latest Ref Rng & Units 09/25/2021  ?  6:35 PM 06/22/2020  ? 12:12 PM 12/24/2018  ?  3:21 AM  ?CBC  ?WBC 4.0 - 10.5 K/uL 13.9   8.1   8.7    ?Hemoglobin 13.0 - 17.0 g/dL 13.5   13.1   12.0    ?Hematocrit 39.0 - 52.0 % 39.8   37.5   36.1    ?Platelets 150 - 400 K/uL 183   177   166    ? ? ? ? ?CMP  ?   ?Component Value Date/Time  ? NA 135 09/25/2021 1835  ? NA 141 07/19/2020 1209  ? K 3.7 09/25/2021 1835  ? CL 100 09/25/2021 1835  ? CO2 25 09/25/2021 1835  ? GLUCOSE 92 09/25/2021 1835  ? BUN 15 09/25/2021 1835  ? BUN 15 07/19/2020 1209  ? CREATININE 1.30 (H) 12/04/2021 1520  ? CALCIUM 9.2 09/25/2021 1835  ? PROT 7.2 09/25/2021 1850  ? PROT 6.7 04/01/2018 0803  ? ALBUMIN 4.3 09/25/2021 1850  ? ALBUMIN 4.4 04/01/2018 0803  ? AST 19 09/25/2021 1850  ? ALT 20 09/25/2021 1850  ? ALKPHOS 42 09/25/2021 1850  ? BILITOT 1.0 09/25/2021 1850  ? BILITOT 0.6 04/01/2018 0803  ? GFRNONAA >60 09/25/2021 1835  ? GFRAA 89 07/19/2020 1209  ? ? ? ?No results found. ? ?   ?Assessment & Plan:  ? ?1. Stenosis of left carotid artery ?Recommend: ? ?The patient remains asymptomatic with respect to the carotid stenosis.  However, the patient has now progressed and has a lesion the is >75%. ? ?Patient's CT angiography of the carotid arteries confirms >75% left ICA stenosis.  The anatomical considerations support surgery over stenting.  This was discussed in detail with the patient. ? ?The patient does indeed need surgery, therefore,  cardiac clearance will be arranged. Once cleared the patient will be scheduled for surgery. ? ?The risks, benefits and alternative therapies were reviewed in detail with the patient.  All questions were answered.  The patient agrees to proceed with surgery of the left carotid artery. ? ?Continue antiplatelet therapy  as prescribed. ?Continue management of CAD, HTN and Hyperlipidemia. ?Healthy heart diet, encouraged exercise at least 4 times per week.   ? ?2. Primary hypertension ?Continue antihypertensive medications as already ordered, these medications have been reviewed and there are no changes at this time.  ? ?3. Mixed hyperlipidemia ?Continue statin as ordered and reviewed, no changes at this time  ? ? ?Current Outpatient Medications on File Prior to Visit  ?Medication Sig Dispense Refill  ? buPROPion (WELLBUTRIN XL) 300 MG 24 hr tablet Take 300 mg by mouth daily.    ? citalopram (CELEXA) 40 MG tablet Take 40 mg by mouth daily.    ? clopidogrel (PLAVIX) 75 MG tablet Take 75 mg by mouth daily. Patient is still taking this medication because he has 3 months supply of it.    ? isosorbide mononitrate (IMDUR) 60 MG 24 hr tablet TAKE 1 TABLET BY MOUTH EVERY DAY 90 tablet 0  ? metoprolol succinate (TOPROL-XL) 25 MG 24 hr tablet TAKE 1 TABLET (25 MG TOTAL) BY MOUTH DAILY. 90 tablet 0  ? nitroGLYCERIN (NITROSTAT) 0.4 MG SL tablet Place 1 tablet (0.4 mg total) under the tongue every 5 (five) minutes as needed. 25 tablet 3  ? pantoprazole (PROTONIX) 40 MG tablet Take 1 tablet (40 mg total) by mouth daily. 30 tablet 0  ? rosuvastatin (CRESTOR) 40 MG tablet TAKE 1 TABLET BY MOUTH EVERY DAY 90 tablet 2  ? aspirin EC 81 MG tablet Take 1 tablet (81 mg total) by mouth daily. Swallow whole. 90 tablet 3  ? sucralfate (CARAFATE) 1 g tablet Take 1 tablet (1 g total) by mouth 4 (four) times daily for 5 days. 20 tablet 0  ? ?No current facility-administered medications on file prior to visit.  ? ? ?There are no Patient Instructions  on file for this visit. ?No follow-ups on file. ? ? ?Kris Hartmann, NP ? ? ?

## 2021-12-18 ENCOUNTER — Telehealth (INDEPENDENT_AMBULATORY_CARE_PROVIDER_SITE_OTHER): Payer: Self-pay

## 2021-12-18 NOTE — Telephone Encounter (Signed)
Spoke with the patient and he is scheduled with Dr. Delana Meyer for a left CEA on 01/15/22 at the [facility]. Pre-op phone call is on 01/08/22 between 8-1 pm. Pre-surgical instructions were discussed and will be mailed. ?

## 2021-12-31 NOTE — Telephone Encounter (Signed)
Patient's surgery has been rescheduled from 01/15/22 to 01/08/22 with Dr. Delana Meyer at the [facility]. Pre-op phone call is on 01/03/22 between 1-3 pm. Pre-surgical instructions were discussed and will be mailed. ?

## 2022-01-03 ENCOUNTER — Other Ambulatory Visit (INDEPENDENT_AMBULATORY_CARE_PROVIDER_SITE_OTHER): Payer: Self-pay | Admitting: Nurse Practitioner

## 2022-01-03 ENCOUNTER — Encounter
Admission: RE | Admit: 2022-01-03 | Discharge: 2022-01-03 | Disposition: A | Discharge status: Home / Self Care | Payer: BC Managed Care – PPO | Source: Ambulatory Visit | Attending: Vascular Surgery | Admitting: Vascular Surgery

## 2022-01-03 DIAGNOSIS — I6522 Occlusion and stenosis of left carotid artery: Secondary | ICD-10-CM

## 2022-01-03 HISTORY — DX: Occlusion and stenosis of bilateral carotid arteries: I65.23

## 2022-01-03 HISTORY — DX: Unstable angina: I20.0

## 2022-01-03 HISTORY — DX: Atherosclerotic heart disease of native coronary artery without angina pectoris: I25.10

## 2022-01-03 HISTORY — DX: Tinnitus, unspecified ear: H93.19

## 2022-01-03 HISTORY — DX: Unspecified osteoarthritis, unspecified site: M19.90

## 2022-01-03 NOTE — Patient Instructions (Addendum)
Your procedure is scheduled on:01-08-22 Wednesday ?Report to the Registration Desk on the 1st floor of the [facility].Then proceed to the 2nd floor Surgery Desk ?To find out your arrival time, please call [phone] between 1PM - 3PM on:01-07-22 Tuesday ?If your arrival time is 6:00 am, do not arrive prior to that time as the [facility] entrance doors do not open until 6:00 am. ? ?REMEMBER: ?Instructions that are not followed completely may result in serious medical risk, up to and including death; or upon the discretion of your surgeon and anesthesiologist your surgery may need to be rescheduled. ? ?Do not eat food OR drink any liquids after midnight the night before surgery.  ?No gum chewing, lozengers or hard candies. ? ?TAKE THESE MEDICATIONS THE MORNING OF SURGERY WITH A SIP OF WATER: ?-pantoprazole (PROTONIX) ? ?Stop your clopidogrel (PLAVIX) 5 days prior to surgery-Last dose on 01-02-22 (Thursday) ? ?Continue your 81 mg Aspirin up until the day prior to surgery-Do NOT take the day of surgery ? ?One week prior to surgery: ?Stop Anti-inflammatories (NSAIDS) such as Advil, Aleve, Ibuprofen, Motrin, Naproxen, Naprosyn and Aspirin based products such as Excedrin, Goodys Powder, BC Powder.You may however,take Tylenol if needed for pain up until the day of surgery. ?Stop ANY OVER THE COUNTER supplements/vitamins NOW (01-03-22) until after surgery. ? ?No Alcohol for 24 hours before or after surgery. ? ?No Smoking including e-cigarettes for 24 hours prior to surgery.  ?No chewable tobacco products for at least 6 hours prior to surgery.  ?No nicotine patches on the day of surgery. ? ?Do not use any "recreational" drugs for at least a week prior to your surgery.  ?Please be advised that the combination of cocaine and anesthesia may have negative outcomes, up to and including death. ?If you test positive for cocaine, your surgery will be cancelled. ? ?On the morning of surgery brush your teeth with toothpaste and water,  you may rinse your mouth with mouthwash if you wish. ?Do not swallow any toothpaste or mouthwash. ? ?Use CHG Soap as directed on instruction sheet. ? ?Do not wear jewelry, make-up, hairpins, clips or nail polish. ? ?Do not wear lotions, powders, or perfumes.  ? ?Do not shave body from the neck down 48 hours prior to surgery just in case you cut yourself which could leave a site for infection.  ?Also, freshly shaved skin may become irritated if using the CHG soap. ? ?Contact lenses, hearing aids and dentures may not be worn into surgery. ? ?Do not bring valuables to the hospital. [facility] is not responsible for any missing/lost belongings or valuables.  ? ?Notify your doctor if there is any change in your medical condition (cold, fever, infection). ? ?Wear comfortable clothing (specific to your surgery type) to the hospital. ? ?After surgery, you can help prevent lung complications by doing breathing exercises.  ?Take deep breaths and cough every 1-2 hours. Your doctor may order a device called an Incentive Spirometer to help you take deep breaths. ?When coughing or sneezing, hold a pillow firmly against your incision with both hands. This is called ?splinting.? Doing this helps protect your incision. It also decreases belly discomfort. ? ?If you are being admitted to the hospital overnight, leave your suitcase in the car. ?After surgery it may be brought to your room. ? ?If you are being discharged the day of surgery, you will not be allowed to drive home. ?You will need a responsible adult (18 years or older) to drive you  home and stay with you that night.  ? ?If you are taking public transportation, you will need to have a responsible adult (18 years or older) with you. ?Please confirm with your physician that it is acceptable to use public transportation.  ? ?Please call the [facility]. at [phone] if you have any questions about these instructions. ? ?Surgery Visitation  Policy: ? ?Patients undergoing a surgery or procedure may have two family members or support persons with them as long as the person is not COVID-19 positive or experiencing its symptoms.  ? ?Inpatient Visitation:   ? ?Visiting hours are 7 a.m. to 8 p.m. ?Up to four visitors are allowed at one time in a patient room, including children. The visitors may rotate out with other people during the day. One designated support person (adult) may remain overnight.  ?

## 2022-01-03 NOTE — Progress Notes (Signed)
?Perioperative Services ? ?Pre-Admission/Anesthesia Testing Clinical Review ? ?Date: 01/06/22 ? ?Patient Demographics:  ?Name: Carlos Diaz ?DOB:   1960-10-02 ?MRN:   [id number] ? ?Planned Surgical Procedure(s):  ? ? Case: [id number] Date/Time: 01/08/22 0715  ? Procedure: ENDARTERECTOMY CAROTID (Left)  ? Anesthesia type: General  ? Pre-op diagnosis: CAROTID ARTERY STENOSIS  ? Location: [facility] OR ROOM 08 / [facility]S FOR ANESTHESIA [facility]  ? Surgeons: Katha Cabal, MD  ? ?NOTE: Available PAT nursing documentation and vital signs have been reviewed. Clinical nursing staff has updated patient's PMH/PSHx, current medication list, and drug allergies/intolerances to ensure comprehensive history available to assist in medical decision making as it pertains to the aforementioned surgical procedure and anticipated anesthetic course. Extensive review of available clinical information performed. [place] PMH and PSHx updated with any diagnoses/procedures that  may have been inadvertently omitted during his intake with the pre-admission testing department's nursing staff. ? ?Clinical Discussion:  ?Carlos Diaz is a 61 y.o. male who is submitted for pre-surgical anesthesia review and clearance prior to him undergoing the above procedure. Patient has never been a smoker. Pertinent PMH includes: CAD, NSTEMI, unstable angina, diastolic dysfunction, HTN, HLD, GERD (on daily PPI), depression, anxiety. ? ?Patient is followed by cardiology Fletcher Anon, MD). He was last seen in the cardiology clinic on 12/13/2021; notes reviewed.  At the time of his clinic visit, patient doing well overall from a cardiovascular perspective.  He denied any chest pain, shortness breath, PND,, palpitations, significant peripheral edema, fatigue, vertiginous symptoms, or presyncope/syncope.  Patient with a past medical history significant for cardiovascular diagnoses. ? ?Patient suffered an NSTEMI on 04/13/2016.  Subsequent PCI was performed on 04/14/2016 here  at [facility] by Dr. Katrine Coho, MD.  Study revealed multivessel CAD; 95% ramus intermedius, 50% ostial OM2-OM2, and 50% proximal and mid LAD.  PCI was performed placing a 2.25 x 15 mm Xience Alpine DES to the ramus intermedius lesion yielding a excellent angiographic result and TIMI-3 flow. ? ?Diagnostic left heart catheterization with 12/23/2018 revealing multivessel CAD; 95% ostial OM1 (culprit lesion), 40% proximal LAD, and 40% mid RCA.  Subsequent PCI was performed placing a 2.25 x 15 mm Resolute Onyx DES to the ostial OM1 lesion giving an excellent angiographic result and TIMI-3 flow. ? ?Last TTE was performed on 11/23/2018 revealing a normal left ventricular systolic function with an EF of >55%.  There was mild pan valvular regurgitation.  Aortic valve with moderate calcification, however there was no significant stenosis noted. Diastolic Doppler parameters consistent with abnormal relaxation (G1DD). ? ?Diagnostic left heart catheterization was performed on 06/25/2020 revealing a normal left ventricular systolic function with an EF of 55 to 65%.  There was multivessel CAD; 40% proximal and mid RCA, 20% proximal RCA, 50% proximal mid LAD, and 30% OM2-2.  Ramus intermedius and OM2-1 stents noted to be widely patent.  Further intervention was deferred opting for medical management. ? ?Most recent myocardial perfusion imaging study was performed in 11/13/2021 revealing a normal left ventricular systolic function with an EF of 56%.  There were coronary artery calcifications noted in the LAD and LCx.  There was no evidence of stress-induced myocardial ischemia or arrhythmia.  Study determined to be low risk. ? ?Patient with a known history of carotid artery disease.  He underwent CTA of the neck on 12/04/2021 revealing a 70 to 80% stenosis of the proximal LICA.  Contralaterally, there was mild plaquing noted in the RICA, however there was no significant resulting  stenosis. ? ?Following stent placement, patient remains on daily DAPT therapy (ASA + clopidogrel).  Patient is reportedly compliant with therapy with no evidence or reports of GI bleeding.  Blood pressure well controlled at 128/86 on currently prescribed nitrate and beta-blocker therapies.  In addition to his scheduled nitrate (isosorbide mononitrate), patient has SL nitroglycerin to use on a as needed basis; denied recent use.  Patient is on a statin for his HLD and further ASCVD prevention.  He is not diabetic.  Patient remains active and has a formal exercise regimen. Functional capacity, as defined by DASI, is documented as being >/= 4 METS.  No changes were made to his medication regimen.  Patient to follow-up with outpatient cardiology in 6 months or sooner if needed. ? ?Carlos Diaz is scheduled for a LEFT CAROTID ENDARTERECTOMY on 01/08/2022 with Dr. [name], MD.  Given patient's past medical history significant for cardiovascular diagnoses, presurgical cardiac clearance was sought by the performing surgeon's office and PAT team.  Per cardiology, "RCRI places patient at a low risk for noncardiac surgery. Per Duke Activity Status Index, he can achieve > 4 METs without cardiac limitation. Recent Lexiscan MPI last month showed no evidence of ischemia and was overall low risk.  He may proceed with noncardiac procedure at an overall LOW risk with no further cardiac intervention or testing indicated at this time".  Again, this patient is on daily DAPT therapy.  He has been instructed on recommendations from his cardiologist for holding his clopidogrel for 5 days prior to his procedure with plans to restart as soon as postoperative bleeding respectively minimized by primary attending surgeon.  Patient is aware that his last dose of clopidogrel will be on 01/02/2022.  Cardiology recommending that patient continue his daily low-dose ASA throughout his perioperative course. ? ?Patient denies previous  perioperative complications with anesthesia in the past. In review of the available records, it is noted that patient underwent a general anesthetic course here at [facility] (ASA III) in 03/2020 without documented complications.  ? ? ?  12/13/2021  ? 10:17 AM 12/09/2021  ?  2:38 PM 11/21/2021  ?  8:38 AM  ?Vitals with BMI  ?Height '5\' 11"'$   '5\' 11"'$   ?Weight 234 lbs 240 lbs 234 lbs 10 oz  ?BMI 32.65 33.49 32.73  ?Systolic 295 188 416  ?Diastolic 86 92 71  ?Pulse 53 63 60  ? ? ?Providers/Specialists:  ? ?NOTE: Primary physician provider listed below. Patient may have been seen by APP or partner within same practice.  ? ?PROVIDER ROLE / SPECIALTY LAST OV  ?Schnier, Dolores Lory, MD Vascular Surgery (Surgeon) 12/09/2021  ?Valera Castle, MD Primary Care Provider 05/13/2021  ?Kathlyn Sacramento, MD Cardiology 12/13/2021  ? ?Allergies:  ?Patient has no known allergies. ? ?Current Home Medications:  ? ?No current facility-administered medications for this encounter.  ? ? aspirin EC 81 MG tablet  ? buPROPion (WELLBUTRIN XL) 300 MG 24 hr tablet  ? citalopram (CELEXA) 40 MG tablet  ? clopidogrel (PLAVIX) 75 MG tablet  ? isosorbide mononitrate (IMDUR) 60 MG 24 hr tablet  ? metoprolol succinate (TOPROL-XL) 25 MG 24 hr tablet  ? nitroGLYCERIN (NITROSTAT) 0.4 MG SL tablet  ? pantoprazole (PROTONIX) 40 MG tablet  ? rosuvastatin (CRESTOR) 40 MG tablet  ? ?History:  ? ?Past Medical History:  ?Diagnosis Date  ? Anxiety   ? Arthritis   ? Carotid artery disease ([place])   ? a.) Carotid doppler 11/14/2021: 1-39% RICA;  62-70% LICA; < 35% CCA and ECA. c.) CTA neck 12/04/2021: 70-80% stenosis of prox LICA; mild plaquing in prox RICA without stenosis.  ? Coronary artery disease 04/14/2016  ? a.) LHC 04/14/2016: 95% RI, 50% oOM2-OM2, 50% p-mLAD --> PCI performed placing a 2.25 x 15 mm Xience Alpine DES to RI. b.) LHC 12/23/2018: 95% OM1, 40% pLAD, 40% mRCA --> PCI performed placing a 2.25 x 15 mm Resolute Onxy DES  to oOM1. c.) LHC 06/25/2020: EF 55-65%; LVEDP norm; 40% p-mRCA, 20% pRCA, 50% p-mLAD, 30% OM2-2; RI and OM2-1 stents patent; intervention deferred opting for medical mgmt.  ? Depression   ? Diastolic dysfunction 00/93/8182

## 2022-01-06 ENCOUNTER — Encounter
Admission: RE | Admit: 2022-01-06 | Discharge: 2022-01-06 | Disposition: A | Discharge status: Home / Self Care | Payer: BC Managed Care – PPO | Source: Ambulatory Visit | Attending: Vascular Surgery | Admitting: Vascular Surgery

## 2022-01-06 ENCOUNTER — Encounter: Payer: Self-pay | Admitting: Vascular Surgery

## 2022-01-06 DIAGNOSIS — F419 Anxiety disorder, unspecified: Secondary | ICD-10-CM | POA: Diagnosis not present

## 2022-01-06 DIAGNOSIS — I6522 Occlusion and stenosis of left carotid artery: Secondary | ICD-10-CM | POA: Diagnosis not present

## 2022-01-06 DIAGNOSIS — E782 Mixed hyperlipidemia: Secondary | ICD-10-CM | POA: Diagnosis not present

## 2022-01-06 DIAGNOSIS — I2510 Atherosclerotic heart disease of native coronary artery without angina pectoris: Secondary | ICD-10-CM | POA: Diagnosis not present

## 2022-01-06 DIAGNOSIS — Z955 Presence of coronary angioplasty implant and graft: Secondary | ICD-10-CM | POA: Diagnosis not present

## 2022-01-06 DIAGNOSIS — Z7982 Long term (current) use of aspirin: Secondary | ICD-10-CM | POA: Diagnosis not present

## 2022-01-06 DIAGNOSIS — I10 Essential (primary) hypertension: Secondary | ICD-10-CM | POA: Diagnosis not present

## 2022-01-06 DIAGNOSIS — I6529 Occlusion and stenosis of unspecified carotid artery: Secondary | ICD-10-CM | POA: Diagnosis not present

## 2022-01-06 DIAGNOSIS — F32A Depression, unspecified: Secondary | ICD-10-CM | POA: Diagnosis not present

## 2022-01-06 DIAGNOSIS — Z01812 Encounter for preprocedural laboratory examination: Secondary | ICD-10-CM | POA: Insufficient documentation

## 2022-01-06 DIAGNOSIS — K219 Gastro-esophageal reflux disease without esophagitis: Secondary | ICD-10-CM | POA: Diagnosis not present

## 2022-01-06 DIAGNOSIS — I252 Old myocardial infarction: Secondary | ICD-10-CM | POA: Diagnosis not present

## 2022-01-06 DIAGNOSIS — Z79899 Other long term (current) drug therapy: Secondary | ICD-10-CM | POA: Diagnosis not present

## 2022-01-06 DIAGNOSIS — M1990 Unspecified osteoarthritis, unspecified site: Secondary | ICD-10-CM | POA: Diagnosis not present

## 2022-01-06 DIAGNOSIS — Z7902 Long term (current) use of antithrombotics/antiplatelets: Secondary | ICD-10-CM | POA: Diagnosis not present

## 2022-01-06 DIAGNOSIS — Z8249 Family history of ischemic heart disease and other diseases of the circulatory system: Secondary | ICD-10-CM | POA: Diagnosis not present

## 2022-01-06 LAB — BASIC METABOLIC PANEL
Anion gap: 9 (ref 5–15)
BUN: 22 mg/dL (ref 8–23)
CO2: 24 mmol/L (ref 22–32)
Calcium: 9.3 mg/dL (ref 8.9–10.3)
Chloride: 106 mmol/L (ref 98–111)
Creatinine, Ser: 1.08 mg/dL (ref 0.61–1.24)
GFR, Estimated: >60 mL/min (ref >60)
Glucose, Bld: 89 mg/dL (ref 70–99)
Potassium: 3.7 mmol/L (ref 3.5–5.1)
Sodium: 139 mmol/L (ref 135–145)

## 2022-01-06 LAB — CBC WITH DIFFERENTIAL/PLATELET
Abs Immature Granulocytes: 0.03 10*3/uL (ref 0.00–0.07)
Basophils Absolute: 0.1 10*3/uL (ref 0.0–0.1)
Basophils Relative: 1 %
Eosinophils Absolute: 0.1 10*3/uL (ref 0.0–0.5)
Eosinophils Relative: 2 %
HCT: 37.2 % — ABNORMAL LOW (ref 39.0–52.0)
Hemoglobin: 12.5 g/dL — ABNORMAL LOW (ref 13.0–17.0)
Immature Granulocytes: 0 %
Lymphocytes Relative: 28 %
Lymphs Abs: 2.3 10*3/uL (ref 0.7–4.0)
MCH: 29.9 pg (ref 26.0–34.0)
MCHC: 33.6 g/dL (ref 30.0–36.0)
MCV: 89 fL (ref 80.0–100.0)
Monocytes Absolute: 0.5 10*3/uL (ref 0.1–1.0)
Monocytes Relative: 6 %
Neutro Abs: 5.1 10*3/uL (ref 1.7–7.7)
Neutrophils Relative %: 63 %
Platelets: 168 10*3/uL (ref 150–400)
RBC: 4.18 MIL/uL — ABNORMAL LOW (ref 4.22–5.81)
RDW: 12.6 % (ref 11.5–15.5)
WBC: 8.1 10*3/uL (ref 4.0–10.5)
nRBC: 0 % (ref 0.0–0.2)

## 2022-01-06 LAB — TYPE AND SCREEN
ABO/RH(D): A POS
Antibody Screen: NEGATIVE

## 2022-01-07 MED ORDER — CHLORHEXIDINE GLUCONATE CLOTH 2 % EX PADS: 6.0000 | MEDICATED_PAD | Freq: Once | CUTANEOUS | Status: DC

## 2022-01-07 MED ORDER — CEFAZOLIN SODIUM-DEXTROSE 2-4 GM/100ML-% IV SOLN
2.0000 g | INTRAVENOUS | Status: AC
  Administered 2022-01-08: 2 g via INTRAVENOUS

## 2022-01-07 MED ORDER — CHLORHEXIDINE GLUCONATE 0.12 % MT SOLN
15.0000 mL | Freq: Once | OROMUCOSAL | Status: AC
  Administered 2022-01-08: 15 mL via OROMUCOSAL

## 2022-01-07 MED ORDER — ORAL CARE MOUTH RINSE
15.0000 mL | Freq: Once | OROMUCOSAL | Status: AC
Start: 2022-01-07 — End: 2022-01-08

## 2022-01-08 ENCOUNTER — Other Ambulatory Visit: Payer: BC Managed Care – PPO

## 2022-01-08 ENCOUNTER — Encounter: Payer: Self-pay | Admitting: Vascular Surgery

## 2022-01-08 ENCOUNTER — Inpatient Hospital Stay: Payer: BC Managed Care – PPO | Admitting: Urgent Care

## 2022-01-08 ENCOUNTER — Inpatient Hospital Stay
Admission: RE | Admit: 2022-01-08 | Discharge: 2022-01-10 | DRG: 039 | Disposition: A | Discharge status: Home / Self Care | Payer: BC Managed Care – PPO | Attending: Vascular Surgery | Admitting: Vascular Surgery

## 2022-01-08 ENCOUNTER — Other Ambulatory Visit: Payer: Self-pay

## 2022-01-08 ENCOUNTER — Encounter
Admission: RE | Disposition: A | Discharge status: Home / Self Care | Payer: Self-pay | Source: Home / Self Care | Attending: Vascular Surgery

## 2022-01-08 DIAGNOSIS — M1990 Unspecified osteoarthritis, unspecified site: Secondary | ICD-10-CM | POA: Diagnosis present

## 2022-01-08 DIAGNOSIS — I252 Old myocardial infarction: Secondary | ICD-10-CM

## 2022-01-08 DIAGNOSIS — Z7982 Long term (current) use of aspirin: Secondary | ICD-10-CM | POA: Diagnosis not present

## 2022-01-08 DIAGNOSIS — K219 Gastro-esophageal reflux disease without esophagitis: Secondary | ICD-10-CM | POA: Diagnosis present

## 2022-01-08 DIAGNOSIS — E782 Mixed hyperlipidemia: Secondary | ICD-10-CM | POA: Diagnosis present

## 2022-01-08 DIAGNOSIS — Z8249 Family history of ischemic heart disease and other diseases of the circulatory system: Secondary | ICD-10-CM

## 2022-01-08 DIAGNOSIS — Z79899 Other long term (current) drug therapy: Secondary | ICD-10-CM | POA: Diagnosis not present

## 2022-01-08 DIAGNOSIS — F32A Depression, unspecified: Secondary | ICD-10-CM | POA: Diagnosis present

## 2022-01-08 DIAGNOSIS — I2510 Atherosclerotic heart disease of native coronary artery without angina pectoris: Secondary | ICD-10-CM | POA: Diagnosis not present

## 2022-01-08 DIAGNOSIS — Z7902 Long term (current) use of antithrombotics/antiplatelets: Secondary | ICD-10-CM | POA: Diagnosis not present

## 2022-01-08 DIAGNOSIS — F419 Anxiety disorder, unspecified: Secondary | ICD-10-CM | POA: Diagnosis present

## 2022-01-08 DIAGNOSIS — I6522 Occlusion and stenosis of left carotid artery: Secondary | ICD-10-CM | POA: Diagnosis not present

## 2022-01-08 DIAGNOSIS — I10 Essential (primary) hypertension: Secondary | ICD-10-CM | POA: Diagnosis present

## 2022-01-08 DIAGNOSIS — Z955 Presence of coronary angioplasty implant and graft: Secondary | ICD-10-CM

## 2022-01-08 HISTORY — PX: ENDARTERECTOMY: SHX5162

## 2022-01-08 HISTORY — DX: Long term (current) use of antithrombotics/antiplatelets: Z79.02

## 2022-01-08 HISTORY — DX: Disorder of arteries and arterioles, unspecified: I77.9

## 2022-01-08 LAB — MRSA NEXT GEN BY PCR, NASAL: MRSA by PCR Next Gen: NOT DETECTED

## 2022-01-08 LAB — GLUCOSE, CAPILLARY: Glucose-Capillary: 142 mg/dL — ABNORMAL HIGH (ref 70–99)

## 2022-01-08 LAB — ABO/RH: ABO/RH(D): A POS

## 2022-01-08 SURGERY — ENDARTERECTOMY, CAROTID
Anesthesia: General | Laterality: Left

## 2022-01-08 MED ORDER — PHENYLEPHRINE HCL-NACL 20-0.9 MG/250ML-% IV SOLN: INTRAVENOUS | Status: DC | PRN

## 2022-01-08 MED ORDER — HYDROMORPHONE HCL 1 MG/ML IJ SOLN: 1.0000 mg | Freq: Once | INTRAMUSCULAR | Status: DC | PRN

## 2022-01-08 MED ORDER — PROPOFOL 10 MG/ML IV BOLUS
INTRAVENOUS | Status: AC
  Filled 2022-01-08: qty 20

## 2022-01-08 MED ORDER — OXYCODONE HCL 5 MG/5ML PO SOLN: 5.0000 mg | Freq: Once | ORAL | Status: DC | PRN

## 2022-01-08 MED ORDER — ONDANSETRON HCL 4 MG/2ML IJ SOLN
INTRAMUSCULAR | Status: DC | PRN
  Administered 2022-01-08: 4 mg via INTRAVENOUS

## 2022-01-08 MED ORDER — PHENYLEPHRINE HCL (PRESSORS) 10 MG/ML IV SOLN
INTRAVENOUS | Status: DC | PRN
  Administered 2022-01-08 (×2): 100 ug via INTRAVENOUS

## 2022-01-08 MED ORDER — ACETAMINOPHEN 325 MG PO TABS
325.0000 mg | ORAL_TABLET | ORAL | Status: DC | PRN
  Administered 2022-01-09: 650 mg via ORAL
  Filled 2022-01-08 (×2): qty 2

## 2022-01-08 MED ORDER — ALUM & MAG HYDROXIDE-SIMETH 200-200-20 MG/5ML PO SUSP: 15.0000 mL | ORAL | Status: DC | PRN

## 2022-01-08 MED ORDER — NICARDIPINE HCL IN NACL 20-0.86 MG/200ML-% IV SOLN
INTRAVENOUS | Status: AC
  Filled 2022-01-08: qty 200

## 2022-01-08 MED ORDER — LIDOCAINE HCL 1 % IJ SOLN
INTRAMUSCULAR | Status: DC | PRN
  Administered 2022-01-08: 20 mL

## 2022-01-08 MED ORDER — SUGAMMADEX SODIUM 200 MG/2ML IV SOLN
INTRAVENOUS | Status: DC | PRN
  Administered 2022-01-08: 200 mg via INTRAVENOUS

## 2022-01-08 MED ORDER — FENTANYL CITRATE (PF) 100 MCG/2ML IJ SOLN
INTRAMUSCULAR | Status: AC
Start: 2022-01-08 — End: ?
  Filled 2022-01-08: qty 2

## 2022-01-08 MED ORDER — CITALOPRAM HYDROBROMIDE 20 MG PO TABS
40.0000 mg | ORAL_TABLET | Freq: Every day | ORAL | Status: DC
  Administered 2022-01-08 – 2022-01-10 (×3): 40 mg via ORAL
  Filled 2022-01-08 (×3): qty 2

## 2022-01-08 MED ORDER — KCL IN DEXTROSE-NACL 20-5-0.9 MEQ/L-%-% IV SOLN
INTRAVENOUS | Status: DC
  Filled 2022-01-08 (×7): qty 1000

## 2022-01-08 MED ORDER — ASPIRIN EC 81 MG PO TBEC: 81.0000 mg | DELAYED_RELEASE_TABLET | Freq: Every day | ORAL | Status: DC

## 2022-01-08 MED ORDER — HEPARIN SODIUM (PORCINE) 5000 UNIT/ML IJ SOLN
INTRAMUSCULAR | Status: AC
  Filled 2022-01-08: qty 1

## 2022-01-08 MED ORDER — LACTATED RINGERS IV SOLN: INTRAVENOUS | Status: DC

## 2022-01-08 MED ORDER — FENTANYL CITRATE (PF) 100 MCG/2ML IJ SOLN
INTRAMUSCULAR | Status: DC | PRN
  Administered 2022-01-08 (×2): 100 ug via INTRAVENOUS

## 2022-01-08 MED ORDER — PROMETHAZINE HCL 25 MG/ML IJ SOLN: 6.2500 mg | INTRAMUSCULAR | Status: DC | PRN

## 2022-01-08 MED ORDER — SODIUM CHLORIDE 0.9 % IV SOLN: 500.0000 mL | Freq: Once | INTRAVENOUS | Status: DC | PRN

## 2022-01-08 MED ORDER — ROCURONIUM BROMIDE 10 MG/ML (PF) SYRINGE
PREFILLED_SYRINGE | INTRAVENOUS | Status: AC
  Filled 2022-01-08: qty 10

## 2022-01-08 MED ORDER — ROCURONIUM BROMIDE 100 MG/10ML IV SOLN
INTRAVENOUS | Status: DC | PRN
Start: 2022-01-08 — End: 2022-01-08
  Administered 2022-01-08: 60 mg via INTRAVENOUS

## 2022-01-08 MED ORDER — OXYCODONE HCL 5 MG PO TABS: 5.0000 mg | ORAL_TABLET | Freq: Once | ORAL | Status: DC | PRN

## 2022-01-08 MED ORDER — ISOSORBIDE MONONITRATE ER 30 MG PO TB24
60.0000 mg | ORAL_TABLET | Freq: Every day | ORAL | Status: DC
  Administered 2022-01-09 – 2022-01-10 (×2): 60 mg via ORAL
  Filled 2022-01-08 (×3): qty 2

## 2022-01-08 MED ORDER — PANTOPRAZOLE SODIUM 40 MG IV SOLR
40.0000 mg | Freq: Every day | INTRAVENOUS | Status: DC
  Administered 2022-01-08 – 2022-01-09 (×2): 40 mg via INTRAVENOUS
  Filled 2022-01-08 (×2): qty 10

## 2022-01-08 MED ORDER — ASPIRIN EC 81 MG PO TBEC
81.0000 mg | DELAYED_RELEASE_TABLET | Freq: Every day | ORAL | Status: DC
  Administered 2022-01-09 – 2022-01-10 (×2): 81 mg via ORAL
  Filled 2022-01-08 (×2): qty 1

## 2022-01-08 MED ORDER — LABETALOL HCL 5 MG/ML IV SOLN: 10.0000 mg | INTRAVENOUS | Status: DC | PRN

## 2022-01-08 MED ORDER — BUPROPION HCL ER (XL) 150 MG PO TB24
300.0000 mg | ORAL_TABLET | Freq: Every day | ORAL | Status: DC
Start: 2022-01-08 — End: 2022-01-10
  Administered 2022-01-08 – 2022-01-09 (×2): 300 mg via ORAL
  Filled 2022-01-08: qty 2
  Filled 2022-01-08: qty 1

## 2022-01-08 MED ORDER — ROSUVASTATIN CALCIUM 10 MG PO TABS
40.0000 mg | ORAL_TABLET | Freq: Every day | ORAL | Status: DC
  Administered 2022-01-08 – 2022-01-09 (×2): 40 mg via ORAL
  Filled 2022-01-08 (×2): qty 4

## 2022-01-08 MED ORDER — HEMOSTATIC AGENTS (NO CHARGE) OPTIME
TOPICAL | Status: DC | PRN
Start: 2022-01-08 — End: 2022-01-08
  Administered 2022-01-08: 1 via TOPICAL

## 2022-01-08 MED ORDER — PHENYLEPHRINE HCL-NACL 20-0.9 MG/250ML-% IV SOLN
INTRAVENOUS | Status: DC | PRN
  Administered 2022-01-08: 20 ug/min via INTRAVENOUS

## 2022-01-08 MED ORDER — PANTOPRAZOLE SODIUM 40 MG PO TBEC: 40.0000 mg | DELAYED_RELEASE_TABLET | Freq: Every day | ORAL | Status: DC

## 2022-01-08 MED ORDER — CEFAZOLIN SODIUM-DEXTROSE 2-4 GM/100ML-% IV SOLN
INTRAVENOUS | Status: AC
  Filled 2022-01-08: qty 100

## 2022-01-08 MED ORDER — NITROGLYCERIN IN D5W 200-5 MCG/ML-% IV SOLN
INTRAVENOUS | Status: AC
  Filled 2022-01-08: qty 250

## 2022-01-08 MED ORDER — ONDANSETRON HCL 4 MG/2ML IJ SOLN: 4.0000 mg | Freq: Four times a day (QID) | INTRAMUSCULAR | Status: DC | PRN

## 2022-01-08 MED ORDER — ACETAMINOPHEN 10 MG/ML IV SOLN
INTRAVENOUS | Status: DC | PRN
Start: 2022-01-08 — End: 2022-01-08
  Administered 2022-01-08: 1000 mg via INTRAVENOUS

## 2022-01-08 MED ORDER — EPHEDRINE 5 MG/ML INJ
INTRAVENOUS | Status: AC
  Filled 2022-01-08: qty 5

## 2022-01-08 MED ORDER — PHENOL 1.4 % MT LIQD: 1.0000 | OROMUCOSAL | Status: DC | PRN

## 2022-01-08 MED ORDER — ACETAMINOPHEN 650 MG RE SUPP: 325.0000 mg | RECTAL | Status: DC | PRN

## 2022-01-08 MED ORDER — ACETAMINOPHEN 10 MG/ML IV SOLN: 1000.0000 mg | Freq: Once | INTRAVENOUS | Status: DC | PRN

## 2022-01-08 MED ORDER — ETOMIDATE 2 MG/ML IV SOLN
INTRAVENOUS | Status: DC | PRN
  Administered 2022-01-08: 10 mg via INTRAVENOUS

## 2022-01-08 MED ORDER — ONDANSETRON HCL 4 MG/2ML IJ SOLN
INTRAMUSCULAR | Status: AC
  Filled 2022-01-08: qty 2

## 2022-01-08 MED ORDER — ETOMIDATE 2 MG/ML IV SOLN
INTRAVENOUS | Status: AC
  Filled 2022-01-08: qty 10

## 2022-01-08 MED ORDER — CEFAZOLIN SODIUM-DEXTROSE 2-4 GM/100ML-% IV SOLN
2.0000 g | Freq: Three times a day (TID) | INTRAVENOUS | Status: AC
  Administered 2022-01-08 – 2022-01-09 (×2): 2 g via INTRAVENOUS
  Filled 2022-01-08 (×2): qty 100

## 2022-01-08 MED ORDER — DROPERIDOL 2.5 MG/ML IJ SOLN: 0.6250 mg | Freq: Once | INTRAMUSCULAR | Status: DC | PRN

## 2022-01-08 MED ORDER — HEPARIN 5000 UNITS IN NS 1000 ML (FLUSH)
INTRAMUSCULAR | Status: DC | PRN
  Administered 2022-01-08: 100 mL via INTRAMUSCULAR

## 2022-01-08 MED ORDER — METOPROLOL TARTRATE 5 MG/5ML IV SOLN: 2.0000 mg | INTRAVENOUS | Status: DC | PRN

## 2022-01-08 MED ORDER — NITROGLYCERIN 0.2 MG/ML ON CALL CATH LAB
INTRAVENOUS | Status: DC | PRN
  Administered 2022-01-08: 40 ug via INTRAVENOUS

## 2022-01-08 MED ORDER — GLYCOPYRROLATE 0.2 MG/ML IJ SOLN
INTRAMUSCULAR | Status: DC | PRN
Start: 2022-01-08 — End: 2022-01-08
  Administered 2022-01-08: .1 mg via INTRAVENOUS

## 2022-01-08 MED ORDER — EPHEDRINE SULFATE (PRESSORS) 50 MG/ML IJ SOLN
INTRAMUSCULAR | Status: DC | PRN
Start: 2022-01-08 — End: 2022-01-08
  Administered 2022-01-08 (×2): 5 mg via INTRAVENOUS
  Administered 2022-01-08: 10 mg via INTRAVENOUS
  Administered 2022-01-08: 5 mg via INTRAVENOUS

## 2022-01-08 MED ORDER — FENTANYL CITRATE (PF) 100 MCG/2ML IJ SOLN: 25.0000 ug | INTRAMUSCULAR | Status: DC | PRN

## 2022-01-08 MED ORDER — ACETAMINOPHEN 10 MG/ML IV SOLN
INTRAVENOUS | Status: AC
  Filled 2022-01-08: qty 100

## 2022-01-08 MED ORDER — DOCUSATE SODIUM 100 MG PO CAPS
100.0000 mg | ORAL_CAPSULE | Freq: Every day | ORAL | Status: DC
  Administered 2022-01-09 – 2022-01-10 (×2): 100 mg via ORAL
  Filled 2022-01-08 (×2): qty 1

## 2022-01-08 MED ORDER — FIBRIN SEALANT 2 ML SINGLE DOSE KIT
PACK | CUTANEOUS | Status: DC | PRN
  Administered 2022-01-08: 2 mL via TOPICAL

## 2022-01-08 MED ORDER — HYDRALAZINE HCL 20 MG/ML IJ SOLN: 5.0000 mg | INTRAMUSCULAR | Status: DC | PRN

## 2022-01-08 MED ORDER — PHENYLEPHRINE HCL (PRESSORS) 10 MG/ML IV SOLN
INTRAVENOUS | Status: AC
  Filled 2022-01-08: qty 1

## 2022-01-08 MED ORDER — GUAIFENESIN-DM 100-10 MG/5ML PO SYRP: 15.0000 mL | ORAL_SOLUTION | ORAL | Status: DC | PRN

## 2022-01-08 MED ORDER — LIDOCAINE HCL (CARDIAC) PF 100 MG/5ML IV SOSY
PREFILLED_SYRINGE | INTRAVENOUS | Status: DC | PRN
Start: 2022-01-08 — End: 2022-01-08
  Administered 2022-01-08: 100 mg via INTRAVENOUS

## 2022-01-08 MED ORDER — CHLORHEXIDINE GLUCONATE CLOTH 2 % EX PADS
6.0000 | MEDICATED_PAD | Freq: Every day | CUTANEOUS | Status: DC
  Administered 2022-01-09: 6 via TOPICAL

## 2022-01-08 MED ORDER — OXYCODONE HCL 5 MG PO TABS
5.0000 mg | ORAL_TABLET | ORAL | Status: DC | PRN
  Administered 2022-01-08 – 2022-01-09 (×7): 5 mg via ORAL
  Filled 2022-01-08 (×7): qty 1

## 2022-01-08 MED ORDER — NITROGLYCERIN 0.4 MG SL SUBL: 0.4000 mg | SUBLINGUAL_TABLET | SUBLINGUAL | Status: DC | PRN

## 2022-01-08 MED ORDER — FENTANYL CITRATE (PF) 100 MCG/2ML IJ SOLN
INTRAMUSCULAR | Status: AC
  Filled 2022-01-08: qty 2

## 2022-01-08 MED ORDER — PROPOFOL 10 MG/ML IV BOLUS
INTRAVENOUS | Status: DC | PRN
  Administered 2022-01-08 (×2): 50 mg via INTRAVENOUS
  Administered 2022-01-08: 100 mg via INTRAVENOUS

## 2022-01-08 MED ORDER — METOPROLOL SUCCINATE ER 25 MG PO TB24
25.0000 mg | ORAL_TABLET | Freq: Every day | ORAL | Status: DC
  Administered 2022-01-09 – 2022-01-10 (×2): 25 mg via ORAL
  Filled 2022-01-08 (×3): qty 1

## 2022-01-08 MED ORDER — HYDROMORPHONE HCL 1 MG/ML IJ SOLN
1.0000 mg | INTRAMUSCULAR | Status: AC | PRN
  Administered 2022-01-08 – 2022-01-09 (×5): 1 mg via INTRAVENOUS
  Filled 2022-01-08 (×5): qty 1

## 2022-01-08 MED ORDER — LIDOCAINE HCL (PF) 2 % IJ SOLN
INTRAMUSCULAR | Status: AC
  Filled 2022-01-08: qty 5

## 2022-01-08 MED ORDER — CHLORHEXIDINE GLUCONATE 0.12 % MT SOLN
OROMUCOSAL | Status: AC
  Administered 2022-01-08: 15 mL via OROMUCOSAL
  Filled 2022-01-08: qty 15

## 2022-01-08 MED ORDER — LIDOCAINE HCL (PF) 1 % IJ SOLN
INTRAMUSCULAR | Status: AC
  Filled 2022-01-08: qty 30

## 2022-01-08 MED ORDER — HEPARIN SODIUM (PORCINE) 1000 UNIT/ML IJ SOLN
INTRAMUSCULAR | Status: DC | PRN
Start: 2022-01-08 — End: 2022-01-08
  Administered 2022-01-08: 7500 [IU] via INTRAVENOUS

## 2022-01-08 SURGICAL SUPPLY — 61 items
BAG DECANTER FOR FLEXI CONT (MISCELLANEOUS) ×2 IMPLANT
BLADE SURG 15 STRL LF DISP TIS (BLADE) ×1 IMPLANT
BLADE SURG 15 STRL SS (BLADE) ×1
BLADE SURG SZ11 CARB STEEL (BLADE) ×2 IMPLANT
BOOT SUTURE AID YELLOW STND (SUTURE) ×4 IMPLANT
BRUSH SCRUB EZ  4% CHG (MISCELLANEOUS) ×1
BRUSH SCRUB EZ 4% CHG (MISCELLANEOUS) ×1 IMPLANT
CHLORAPREP W/TINT 26 (MISCELLANEOUS) ×2 IMPLANT
DERMABOND ADVANCED (GAUZE/BANDAGES/DRESSINGS) ×1
DERMABOND ADVANCED .7 DNX12 (GAUZE/BANDAGES/DRESSINGS) ×1 IMPLANT
DRAPE 3/4 80X56 (DRAPES) ×2 IMPLANT
DRAPE INCISE IOBAN 66X45 STRL (DRAPES) ×2 IMPLANT
DRAPE LAPAROTOMY 100X77 ABD (DRAPES) ×2 IMPLANT
DRESSING SURGICEL FIBRLLR 1X2 (HEMOSTASIS) ×1 IMPLANT
DRSG SURGICEL FIBRILLAR 1X2 (HEMOSTASIS) ×2
ELECT CAUTERY BLADE 6.4 (BLADE) ×2 IMPLANT
ELECT REM PT RETURN 9FT ADLT (ELECTROSURGICAL) ×2
ELECTRODE REM PT RTRN 9FT ADLT (ELECTROSURGICAL) ×1 IMPLANT
GAUZE 4X4 16PLY ~~LOC~~+RFID DBL (SPONGE) ×2 IMPLANT
GLOVE SURG SYN 8.0 (GLOVE) ×2 IMPLANT
GLOVE SURG SYN 8.0 PF PI (GLOVE) ×1 IMPLANT
GOWN STRL REUS W/ TWL LRG LVL3 (GOWN DISPOSABLE) ×1 IMPLANT
GOWN STRL REUS W/ TWL XL LVL3 (GOWN DISPOSABLE) ×1 IMPLANT
GOWN STRL REUS W/TWL LRG LVL3 (GOWN DISPOSABLE) ×1
GOWN STRL REUS W/TWL XL LVL3 (GOWN DISPOSABLE) ×1
HOLDER FOLEY CATH W/STRAP (MISCELLANEOUS) ×2 IMPLANT
IV NS 500ML (IV SOLUTION) ×1
IV NS 500ML BAXH (IV SOLUTION) ×1 IMPLANT
KIT TURNOVER KIT A (KITS) ×2 IMPLANT
LABEL OR SOLS (LABEL) ×2 IMPLANT
LOOP RED MAXI  1X406MM (MISCELLANEOUS) ×2
LOOP VESSEL MAXI 1X406 RED (MISCELLANEOUS) ×2 IMPLANT
LOOP VESSEL MINI 0.8X406 BLUE (MISCELLANEOUS) ×1 IMPLANT
LOOPS BLUE MINI 0.8X406MM (MISCELLANEOUS) ×1
MANIFOLD NEPTUNE II (INSTRUMENTS) ×2 IMPLANT
NDL FILTER BLUNT 18X1 1/2 (NEEDLE) ×1 IMPLANT
NDL HYPO 25X1 1.5 SAFETY (NEEDLE) ×1 IMPLANT
NEEDLE FILTER BLUNT 18X 1/2SAF (NEEDLE) ×1
NEEDLE FILTER BLUNT 18X1 1/2 (NEEDLE) ×1 IMPLANT
NEEDLE HYPO 25X1 1.5 SAFETY (NEEDLE) ×2 IMPLANT
NS IRRIG 500ML POUR BTL (IV SOLUTION) ×2 IMPLANT
PACK BASIN MAJOR ARMC (MISCELLANEOUS) ×2 IMPLANT
PATCH CAROTID ECM VASC 1X10 (Prosthesis & Implant Heart) ×1 IMPLANT
SHUNT CAROTID STR REINF 3.0X4. (MISCELLANEOUS) ×2 IMPLANT
SPONGE T-LAP 18X18 ~~LOC~~+RFID (SPONGE) ×4 IMPLANT
SUT MNCRL+ 5-0 UNDYED PC-3 (SUTURE) ×1 IMPLANT
SUT MONOCRYL 5-0 (SUTURE) ×1
SUT PROLENE 6 0 BV (SUTURE) ×16 IMPLANT
SUT PROLENE 7 0 BV 1 (SUTURE) ×13 IMPLANT
SUT SILK 2 0 (SUTURE) ×1
SUT SILK 2-0 18XBRD TIE 12 (SUTURE) ×1 IMPLANT
SUT SILK 3 0 (SUTURE) ×1
SUT SILK 3-0 18XBRD TIE 12 (SUTURE) ×1 IMPLANT
SUT SILK 4 0 (SUTURE) ×1
SUT SILK 4-0 18XBRD TIE 12 (SUTURE) ×1 IMPLANT
SUT VIC AB 3-0 SH 27 (SUTURE) ×1
SUT VIC AB 3-0 SH 27X BRD (SUTURE) ×1 IMPLANT
SYR 10ML LL (SYRINGE) ×2 IMPLANT
SYR 20ML LL LF (SYRINGE) ×2 IMPLANT
SYR 3ML LL SCALE MARK (SYRINGE) ×2 IMPLANT
TRAY FOLEY MTR SLVR 16FR STAT (SET/KITS/TRAYS/PACK) ×2 IMPLANT

## 2022-01-08 NOTE — Interval H&P Note (Signed)
History and Physical Interval Note: ? ?01/08/2022 ?7:24 AM ? ?Carlos Diaz  has presented today for surgery, with the diagnosis of CAROTID ARTERY STENOSIS.  The various methods of treatment have been discussed with the patient and family. After consideration of risks, benefits and other options for treatment, the patient has consented to  Procedure(s): ?ENDARTERECTOMY CAROTID (Left) as a surgical intervention.  The patient's history has been reviewed, patient examined, no change in status, stable for surgery.  I have reviewed the patient's chart and labs.  Questions were answered to the patient's satisfaction.   ? ? ?[name] ? ? ?

## 2022-01-08 NOTE — Progress Notes (Signed)
Night of surgery note ? ?S: Patient is alert and appropriate he states his neck is very sore ? ?O: He remains afebrile his vital signs are stable ?     Left neck incisions clean dry and intact there is really no hematoma. ?     Neurologically he is completely intact ? ?A: Status post left carotid endarterectomy ? ?P: Patient is following a normal postoperative course.  He no longer needs the ice pack on his neck.  We will continue as ordered no changes at this time.  I am hopeful he will be discharged tomorrow ?

## 2022-01-08 NOTE — Anesthesia Procedure Notes (Signed)
Arterial Line Insertion ?Start/End5/06/2022 7:50 AM, 01/08/2022 7:50 AM ?Performed by: Iran Ouch, MD, Natasha Mead, CRNA, CRNA ? Patient location: OR. ?Preanesthetic checklist: patient identified, IV checked, site marked, risks and benefits discussed, surgical consent, monitors and equipment checked, pre-op evaluation, timeout performed and anesthesia consent ?Patient sedated ?radial was placed ?Catheter size: 20 G ?Hand hygiene performed  ? ?Attempts: 1 ?Procedure performed without using ultrasound guided technique. ?Ultrasound Notes:anatomy identified ?Following insertion, dressing applied and Biopatch. ?Post procedure assessment: normal ? ?Patient tolerated the procedure well with no immediate complications. ? ? ?

## 2022-01-08 NOTE — Progress Notes (Signed)
Secure chatted Dr. Delana Meyer about hematoma along incision line.  Area marked and ice applied. ?

## 2022-01-08 NOTE — Anesthesia Procedure Notes (Signed)
Procedure Name: Intubation ?Date/Time: 01/08/2022 7:45 AM ?Performed by: Natasha Mead, CRNA ?Pre-anesthesia Checklist: Patient identified, Emergency Drugs available, Suction available and Patient being monitored ?Patient Re-evaluated:Patient Re-evaluated prior to induction ?Oxygen Delivery Method: Circle system utilized ?Preoxygenation: Pre-oxygenation with 100% oxygen ?Induction Type: IV induction ?Ventilation: Mask ventilation without difficulty ?Laryngoscope Size: Sabra Heck and 2 ?Grade View: Grade II ?Tube type: Oral ?Tube size: 7.0 mm ?Number of attempts: 1 ?Airway Equipment and Method: Stylet and Oral airway ?Placement Confirmation: ETT inserted through vocal cords under direct vision, positive ETCO2 and breath sounds checked- equal and bilateral ?Secured at: 22 cm ?Tube secured with: Tape ?Dental Injury: Teeth and Oropharynx as per pre-operative assessment  ? ? ? ? ?

## 2022-01-08 NOTE — Transfer of Care (Signed)
Immediate Anesthesia Transfer of Care Note ? ?Patient: Carlos Diaz ? ?Procedure(s) Performed: ENDARTERECTOMY CAROTID (Left) ? ?Patient Location: PACU ? ?Anesthesia Type:General ? ?Level of Consciousness: drowsy ? ?Airway & Oxygen Therapy: Patient Spontanous Breathing and Patient connected to face mask oxygen ? ?Post-op Assessment: Report given to RN and Post -op Vital signs reviewed and stable ? ?Post vital signs: stable ? ?Last Vitals:  ?Vitals Value Taken Time  ?BP 147/78 01/08/22 1021  ?Temp    ?Pulse 69 01/08/22 1023  ?Resp 11 01/08/22 1023  ?SpO2 100 % 01/08/22 1023  ?Vitals shown include unvalidated device data. ? ?Last Pain:  ?Vitals:  ? 01/08/22 0623  ?TempSrc: Temporal  ?PainSc: 0-No pain  ?   ? ?  ? ?Complications: No notable events documented. ?

## 2022-01-08 NOTE — Plan of Care (Signed)
Patient arrived from specials with a hematoma to left carotid that is marked with an ice pack.  Alert and oriented NAD noted.  NSR on monitor with a line intact.   ?

## 2022-01-08 NOTE — Anesthesia Postprocedure Evaluation (Signed)
Anesthesia Post Note ? ?Patient: Carlos Diaz ? ?Procedure(s) Performed: ENDARTERECTOMY CAROTID (Left) ? ?Patient location during evaluation: PACU ?Anesthesia Type: General ?Level of consciousness: awake and alert ?Pain management: pain level controlled ?Vital Signs Assessment: post-procedure vital signs reviewed and stable ?Respiratory status: spontaneous breathing, nonlabored ventilation and respiratory function stable ?Cardiovascular status: blood pressure returned to baseline and stable ?Postop Assessment: no apparent nausea or vomiting ?Anesthetic complications: no ? ? ?No notable events documented. ? ? ?Last Vitals:  ?Vitals:  ? 01/08/22 1145 01/08/22 1200  ?BP:  111/73  ?Pulse: (!) 56 (!) 56  ?Resp: 15 11  ?Temp:    ?SpO2: 99% 99%  ?  ?Last Pain:  ?Vitals:  ? 01/08/22 1125  ?TempSrc: Oral  ?PainSc: 3   ? ? ?  ?  ?  ?  ?  ?  ? ?[place] Ouch ? ? ? ? ?

## 2022-01-08 NOTE — Op Note (Signed)
[facility] ? ? ?OPERATIVE NOTE ? ?PROCEDURE:   ?1.  Left carotid endarterectomy with CorMatrix arterial patch reconstruction ? ?PRE-OPERATIVE DIAGNOSIS: 1.  Critical carotid stenosis ?2.  Coronary artery disease ? ?POST-OPERATIVE DIAGNOSIS: same as above  ? ?SURGEON: Katha Cabal, MD ? ?ASSISTANT(S): None ? ?ANESTHESIA: general ? ?ESTIMATED BLOOD LOSS: 50 cc ? ?FINDING(S): ?1.  Extensive calcified carotid plaque. ? ?SPECIMEN(S):  Carotid plaque (sent to Pathology) ? ?INDICATIONS:   ?Cashis Rill is a 61 y.o. y.o. male who presents with critical stenosis of the left carotid artery.  The risks, benefits, and alternatives to carotid endarterectomy were discussed with the patient. The differences between carotid stenting and carotid endarterectomy were reviewed.  The patient voiced understanding and appears to be aware that the risks of carotid endarterectomy include but are not limited to: bleeding, infection, stroke, myocardial infarction, death, cranial nerve injuries both temporary and permanent, neck hematoma, possible airway compromise, labile blood pressure post-operatively, cerebral hyperperfusion syndrome, and possible need for additional interventions in the future. The patient is aware of the risks and agrees to proceed forward with the procedure. ? ?DESCRIPTION: ?After full informed written consent was obtained from the patient, the patient was brought back to the operating room and placed supine upon the operating table.  Prior to induction, the patient received IV antibiotics.  After obtaining adequate anesthesia, the patient was placed a supine position with a shoulder roll in place and the patient's neck slightly hyperextended and rotated away from the surgical site.  The patient was prepped in the standard fashion for a left carotid endarterectomy.   ? ?A first assistant was required to provide a safe and appropriate environment for executing the surgery.  The assistant was  integral in providing retraction, exposure, running suture providing suction and in the closing process. ? ?The incision was made anterior to the sternocleidomastoid muscle on the left neck and dissected down through the subcutaneous tissue.  The platysmas was opened with electrocautery.  The internal jugular vein and facial vein were identified.  The facial vein is ligated and divided between 2-0 silk ties. ? ?The omohyoid was identified in the common carotid artery exposed at this level. The dissection was there in carried out along the carotid artery in a cranial direction.  The dissection was then carried along periadventitial plane along the common carotid artery up to the bifurcation. The external carotid artery was identified. Vessel loops were then placed around the external carotid artery as well as the superior thyroid artery. In the process of this dissection, the hypoglossal nerve was identified and protected from harm.  The internal carotid artery was then dissected circumferentially just beyond an area in the internal carotid artery distal to the plaque.   ? ?At this point, we gave the patient 7000 units of intravenous heparin.  After this was allowed to circulate for several minutes, the common carotid followed by the external carotid and then the internal carotid artery were clamped.  Arteriotomy was made in the common carotid artery with a 11 blade, and extended the arteriotomy with a Potts scissor down into the common carotid artery, then the arteriotomy was carried through the bifurcation into the internal carotid artery until I reached an area that was not diseased.  At this point, a Sundt shunt was placed. ? ?The endarterectomy was begun in the left common carotid artery with a Garment/textile technologist and carried this dissection down into the common carotid artery circumferentially.  Then I transected  the plaque at a segment where it was adherent and transected the plaque with Potts scissors.  I then  carried this dissection up into the external carotid artery.  The plaque was extracted by unclamping the external carotid artery and performing an eversion endarterectomy.  The dissection was then carried into the internal carotid artery where a  feathered end point was created.  The plaque was passed off the field as a specimen.  The distal endpoint was tacked down with 6 interrupted 7-0 Prolene sutures.  A CorMatrix arterial patch was delivered onto the field and trimmed appropriately for the artery and sewed in place with 6-0 Prolene using a 4 quadrant technique.  The medial suture line was completed and the lateral suture line was run approximately one quarter the length of the arteriotomy.  Prior to completing this patch angioplasty, the shunt was removed, the internal carotid artery was flushed and there was excellent backbleeding.  The carotid artery repair was flushed with heparinized saline and then the patch angioplasty was completed in the usual fashion.  The flow was then reestablished first to the external carotid artery and then the internal carotid artery to prevent distal embolization. ? ? Several minutes of pressure were held and 6-0 Prolene patch sutures were used as need for hemostasis.  At this point, I placed Surgicel and Evicel topical hemostatic agents.  There was no more active bleeding in the surgical site.  The sternocleidomastoid space was closed with three interrupted 3-0 Vicryl sutures. I then reapproximated the platysma muscle with a running stitch of 3-0 Vicryl.  The skin was then closed with a running subcuticular 4-0 Monocryl.  The skin was then cleaned, dried and Dermabond was used to reinforce the skin closure.  The patient awakened and was taken to the recovery room in stable condition, following commands and moving all four extremities without any apparent deficits. ?   ?COMPLICATIONS: none ? ?CONDITION: stable ? ?[name] ?01/08/2022<10:21 AM  ?

## 2022-01-08 NOTE — Anesthesia Preprocedure Evaluation (Addendum)
Anesthesia Evaluation  ?Patient identified by MRN, date of birth, ID band ?Patient awake ? ? ? ?Reviewed: ?Allergy & Precautions, H&P , NPO status , Patient's Chart, lab work & pertinent test results ? ?Airway ?Mallampati: I ? ?TM Distance: >3 FB ? ? ? ? Dental ?no notable dental hx. ? ?  ?Pulmonary ?neg pulmonary ROS, neg COPD,  ?  ?breath sounds clear to auscultation ? ? ? ? ? ? Cardiovascular ?hypertension, (-) angina+ CAD, + Past MI and + Cardiac Stents (April 2020)  ? ?Rhythm:regular Rate:Normal ? ? ?Stenosis of left carotid artery ? ? ?TTE 11/23/2018: EF >%%; mild panvalvular regurg; fibrocalcified AoV without stenosis; G1DD. ? ?LHC 06/25/2020: EF 55-65%; LVEDP norm; 40% p-mRCA, 20% pRCA, 50% p-mLAD, 30% OM2-2; RI and OM2-1 stents patent; intervention deferred opting for medical mgmt. ? ?Myoview Lexiscan ?? ?? ?Findings are consistent with no ischemia. The study is low risk. ?? ?No ST deviation was noted. ?? ?LV perfusion is normal. ?? ?Left ventricular function is normal. LVEF is 56%. ?? ?Coronary artery calcification noted in the LAD, Lcx ?  ?Neuro/Psych ?PSYCHIATRIC DISORDERS Anxiety Depression Lyme disease with tinnitus and imbalance issues ?negative neurological ROS ?   ? GI/Hepatic ?Neg liver ROS, GERD  Controlled and Medicated,  ?Endo/Other  ?negative endocrine ROS ? Renal/GU ?negative Renal ROS  ?negative genitourinary ?  ?Musculoskeletal ? ? Abdominal ?(+) + obese,   ?Peds ? Hematology ?negative hematology ROS ?(+)   ?Anesthesia Other Findings ?Past Medical History: ?No date: Anxiety ?No date: Depression ?No date: GERD (gastroesophageal reflux disease) ?No date: Hyperlipidemia ?No date: Hypertension ?No date: Lyme disease ?04/13/2016: Myocardial infarction [facility]) ? ?Past Surgical History: ?04/14/2016: CARDIAC CATHETERIZATION; Right ?    Comment:  Procedure: Left Heart Cath and Coronary Angiography;   ?             Surgeon: Dionisio Saamir, MD;  Location: [place] [facility]   ?             LAB;  Service: Cardiovascular;  Laterality: Right; ?04/14/2016: CARDIAC CATHETERIZATION; N/A ?    Comment:  Procedure: Coronary Stent Intervention;  Surgeon: Karma Greaser ?             Prince Rome, MD;  Location: [place] [facility] LAB;   ?             Service: Cardiovascular;  Laterality: N/A; ?No date: COLONOSCOPY ?No date: CORONARY ANGIOPLASTY ?12/23/2018: CORONARY STENT INTERVENTION; N/A ?    Comment:  Procedure: CORONARY STENT INTERVENTION;  Surgeon:  ?             Isaias Cowman, MD;  Location: [place] [facility]  ?             LAB;  Service: Cardiovascular;  Laterality: N/A; ?12/23/2018: LEFT HEART CATH AND CORONARY ANGIOGRAPHY; Left ?    Comment:  Procedure: LEFT HEART CATH AND CORONARY ANGIOGRAPHY;   ?             Surgeon: Dionisio Raymond, MD;  Location: [place] [facility] ?             LAB;  Service: Cardiovascular;  Laterality: Left; ? ? ? ? Reproductive/Obstetrics ?negative OB ROS ? ?  ? ? ? ? ? ? ? ? ? ? ? ? ? ?  ?  ? ? ? ? ? ? ?Anesthesia Physical ? ?Anesthesia Plan ? ?ASA: III ? ?Anesthesia Plan: General  ? ?Post-op Pain Management: Ofirmev IV (intra-op)* and Dilaudid IV  ? ?  Induction: Intravenous ? ?PONV Risk Score and Plan: Ondansetron and Dexamethasone ? ?Airway Management Planned: Oral ETT ? ?Additional Equipment:  ? ?Intra-op Plan:  ? ?Post-operative Plan: Extubation in OR ? ?Informed Consent: I have reviewed the patients History and Physical, chart, labs and discussed the procedure including the risks, benefits and alternatives for the proposed anesthesia with the patient or authorized representative who has indicated his/her understanding and acceptance.  ? ? ? ?Dental Advisory Given ? ?Plan Discussed with: Anesthesiologist, CRNA and Surgeon ? ?Anesthesia Plan Comments:   ? ? ? ?Anesthesia Quick Evaluation ? ?

## 2022-01-09 LAB — CBC
HCT: 36.5 % — ABNORMAL LOW (ref 39.0–52.0)
Hemoglobin: 11.9 g/dL — ABNORMAL LOW (ref 13.0–17.0)
MCH: 30 pg (ref 26.0–34.0)
MCHC: 32.6 g/dL (ref 30.0–36.0)
MCV: 91.9 fL (ref 80.0–100.0)
Platelets: 149 10*3/uL — ABNORMAL LOW (ref 150–400)
RBC: 3.97 MIL/uL — ABNORMAL LOW (ref 4.22–5.81)
RDW: 13.3 % (ref 11.5–15.5)
WBC: 9 10*3/uL (ref 4.0–10.5)
nRBC: 0 % (ref 0.0–0.2)

## 2022-01-09 LAB — BASIC METABOLIC PANEL
Anion gap: 4 — ABNORMAL LOW (ref 5–15)
BUN: 11 mg/dL (ref 8–23)
CO2: 26 mmol/L (ref 22–32)
Calcium: 8.1 mg/dL — ABNORMAL LOW (ref 8.9–10.3)
Chloride: 109 mmol/L (ref 98–111)
Creatinine, Ser: 0.97 mg/dL (ref 0.61–1.24)
GFR, Estimated: >60 mL/min (ref >60)
Glucose, Bld: 113 mg/dL — ABNORMAL HIGH (ref 70–99)
Potassium: 3.9 mmol/L (ref 3.5–5.1)
Sodium: 139 mmol/L (ref 135–145)

## 2022-01-09 LAB — SURGICAL PATHOLOGY

## 2022-01-09 MED ORDER — OXYCODONE HCL 5 MG PO TABS
5.0000 mg | ORAL_TABLET | ORAL | Status: DC | PRN
  Administered 2022-01-09 – 2022-01-10 (×3): 5 mg via ORAL
  Filled 2022-01-09 (×3): qty 1

## 2022-01-09 MED ORDER — HYDROMORPHONE HCL 1 MG/ML IJ SOLN: 1.0000 mg | INTRAMUSCULAR | Status: DC | PRN

## 2022-01-09 MED ORDER — PREDNISONE 10 MG PO TABS
50.0000 mg | ORAL_TABLET | Freq: Two times a day (BID) | ORAL | Status: AC
  Administered 2022-01-09 – 2022-01-10 (×2): 50 mg via ORAL
  Filled 2022-01-09 (×2): qty 1

## 2022-01-09 NOTE — Progress Notes (Signed)
[facility] ? ?Daily Progress Note ? ? ?Subjective  -  ? ?Patient is status post left carotid endarterectomy.  The patient still notes significant soreness however he has not yet been up to walk, he still has a Foley in place, and has eaten very little.  He does complain of a headache. ? ?Objective ?Vitals:  ? 01/09/22 0500 01/09/22 0600 01/09/22 0700 01/09/22 1256  ?BP: 128/65 128/68 122/68 (!) 141/76  ?Pulse: 65 65 68 (!) 59  ?Resp: 10 10 (!) 9 16  ?Temp:    98.7 ?F (37.1 ?C)  ?TempSrc:      ?SpO2: 94% 94% 90% 100%  ?Weight:      ?Height:      ? ? ?Intake/Output Summary (Last 24 hours) at 01/09/2022 1440 ?Last data filed at 01/09/2022 1407 ?Gross per 24 hour  ?Intake 2659.57 ml  ?Output 685 ml  ?Net 1974.57 ml  ? ? ?PULM  CTAB ?CV  RRR ?VASC  no focal neurological issues.  Wound is clean dry and intact. ? ?Laboratory ?CBC ?   ?Component Value Date/Time  ? WBC 9.0 01/09/2022 0430  ? HGB 11.9 (L) 01/09/2022 0430  ? HCT 36.5 (L) 01/09/2022 0430  ? PLT 149 (L) 01/09/2022 0430  ? ? ?BMET ?   ?Component Value Date/Time  ? NA 139 01/09/2022 0430  ? NA 141 07/19/2020 1209  ? K 3.9 01/09/2022 0430  ? CL 109 01/09/2022 0430  ? CO2 26 01/09/2022 0430  ? GLUCOSE 113 (H) 01/09/2022 0430  ? BUN 11 01/09/2022 0430  ? BUN 15 07/19/2020 1209  ? CREATININE 0.97 01/09/2022 0430  ? CALCIUM 8.1 (L) 01/09/2022 0430  ? GFRNONAA >60 01/09/2022 0430  ? GFRAA 89 07/19/2020 1209  ? ? ?Assessment/Planning: ?POD #1 s/p left carotid endarterectomy  ? ?Left neck incision is clean dry and intact.  There is some expected postoperative swelling.  The patient continues to be very sore and is moving very little.  I have explained to the patient that it is imperative that he gets up out of bed and walks as well as sits in a chair with decreased mobility he puts himself at risk for pneumonia.  We discussed adequate pain control.  The patient has had a major surgery and should not expect to be 100% pain-free.  He is going to be  somewhat uncomfortable.  I agreed to allow the patient to have up to 2 more doses of Dilaudid however we would not continue it past this point as he would not be allowed to go home with this.  The patient also has a postoperative headache which is also not unexpected.  Contributing to his headache may also be the fact that the patient has barely eaten all day.  The patient is advised that he needs to eat and drink as this can make his headache worse as well as cause dehydration which can cause other issues for the patient.  We will allow the patient stay for an additional night. ? ?Kris Hartmann ? ?01/09/2022, 2:40 PM ? ? ? ?  ?

## 2022-01-09 NOTE — Clinical Social Work Note (Signed)
?  Transition of Care (TOC) Screening Note ? ? ?Patient Details  ?Name: Carlos Diaz ?Date of Birth: 18-Mar-1961 ? ? ?Transition of Care (TOC) CM/SW Contact:    ?Gracilyn Gunia A Lacy Sofia, LCSW ?Phone Number:[phone] ?01/09/2022, 2:46 PM ? ? ? ?Transition of Care Department [facility]) has reviewed patient and no TOC needs have been identified at this time. We will continue to monitor patient advancement through interdisciplinary progression rounds. If new patient transition needs arise, please place a TOC consult. ?  ?

## 2022-01-09 NOTE — Progress Notes (Signed)
Foley removed at 1430. Patient encouraged and agreed to get out of bed and ambulate in room. Light headed at first but able to ambulate with steady gate. Patient seated in chair and agrees to remain there for now. Educated regarding importance of mobility to avoid post op complications. Patient and wife verbalize understanding.  ?

## 2022-01-10 ENCOUNTER — Other Ambulatory Visit: Payer: Self-pay | Admitting: Physician Assistant

## 2022-01-10 ENCOUNTER — Other Ambulatory Visit: Payer: Self-pay | Admitting: Medical

## 2022-01-10 DIAGNOSIS — I6522 Occlusion and stenosis of left carotid artery: Principal | ICD-10-CM

## 2022-01-10 DIAGNOSIS — I2510 Atherosclerotic heart disease of native coronary artery without angina pectoris: Secondary | ICD-10-CM

## 2022-01-10 DIAGNOSIS — I6523 Occlusion and stenosis of bilateral carotid arteries: Secondary | ICD-10-CM

## 2022-01-10 MED ORDER — OXYCODONE HCL 5 MG PO TABS: 5.0000 mg | ORAL_TABLET | ORAL | 0 refills | Status: DC | PRN

## 2022-01-10 NOTE — Discharge Summary (Signed)
?[facility]    ?Discharge Summary ? ? ? ?Patient ID:  ?Carlos Diaz ?MRN: [id number] ?DOB/AGE: [birth year] 61 y.o. ? ?Admit date: 01/08/2022 ?Discharge date: 01/10/2022 ?Date of Surgery: 01/08/2022 ?Surgeon: Surgeon(s): ?Schnier, Dolores Lory, MD ? ?Admission Diagnosis: ?Asymptomatic carotid artery narrowing without infarction, left [I65.22] ? ?Discharge Diagnoses:  ?Asymptomatic carotid artery narrowing without infarction, left [I65.22] ? ?Secondary Diagnoses: ?Past Medical History:  ?Diagnosis Date  ? Anxiety   ? Arthritis   ? Carotid artery disease ([place])   ? a.) Carotid doppler 11/14/2021: 9-48% RICA; 54-62% LICA; < 70% CCA and ECA. c.) CTA neck 12/04/2021: 70-80% stenosis of prox LICA; mild plaquing in prox RICA without stenosis.  ? Coronary artery disease 04/14/2016  ? a.) LHC 04/14/2016: 95% RI, 50% oOM2-OM2, 50% p-mLAD --> PCI performed placing a 2.25 x 15 mm Xience Alpine DES to RI. b.) LHC 12/23/2018: 95% OM1, 40% pLAD, 40% mRCA --> PCI performed placing a 2.25 x 15 mm Resolute Onxy DES to oOM1. c.) LHC 06/25/2020: EF 55-65%; LVEDP norm; 40% p-mRCA, 20% pRCA, 50% p-mLAD, 30% OM2-2; RI and OM2-1 stents patent; intervention deferred opting for medical mgmt.  ? Depression   ? Diastolic dysfunction 35/00/9381  ? a.) TTE 04/14/2016: EF 65%; mild LA dil, G1DD. b.) stress echo 06/26/2016: EF >55%; triv panvalvular regurg, max workload 13.70 METS. c.) TTE 11/23/2018: EF >%%; mild panvalvular regurg; fibrocalcified AoV without stenosis; G1DD.  ? GERD (gastroesophageal reflux disease)   ? Hyperlipidemia   ? Hypertension   ? Long term current use of antithrombotics/antiplatelets   ? a.) on daily DAPT therapy (ASA + clopidogrel)  ? Lyme disease   ? NSTEMI (non-ST elevated myocardial infarction) ([place]) 04/13/2016  ? a.) LHC 04/14/2016 --> 95% RI, 50% oOM2-OM2, 50% p-mLAD --> PCI performed placing a 2.25 x 15 mm Xience Alpine DES to the RI  ? Tinnitus   ? Unstable angina ([facility])    ? ? ?Procedure(s): ?ENDARTERECTOMY CAROTID ? ?Discharged Condition: good ? ?HPI:  ?Carlos Diaz is a 61 year old male who presented to [facility] on 01/08/2022 for a left carotid endarterectomy.  The patient had a noted approximate 75% stenosis prior to intervention.  The patient awoke without any focal neurological deficits.  He noted a headache as well as some soreness at the incision area.  There was also concern due to some lightheadedness he was experiencing and was kept for an additional day.  Following overnight stay with ambulation and being more active he notes that the lightheadedness has resolved and still has some incisional soreness. ? ?Hospital Course:  ?Carlos Diaz is a 61 y.o. male is S/P Left Carotid Endarterectomy  ?Procedure(s): ?ENDARTERECTOMY CAROTID ?Extubated: POD # 0 ?Physical exam: Mild swelling around incision, expected post endarterectomy surgery.  No signs and symptoms of infection.  Palpable pulses bilaterally.  No focal neurological deficits. ?Post-op wounds clean, dry, intact or healing well ?Pt. Ambulating, voiding and taking PO diet without difficulty. ?Pt pain controlled with PO pain meds. ?Labs as below ?Complications:none ? ?Consults:  ? ? ?Significant Diagnostic Studies: ?CBC ?Lab Results  ?Component Value Date  ? WBC 9.0 01/09/2022  ? HGB 11.9 (L) 01/09/2022  ? HCT 36.5 (L) 01/09/2022  ? MCV 91.9 01/09/2022  ? PLT 149 (L) 01/09/2022  ? ? ?BMET ?   ?Component Value Date/Time  ? NA 139 01/09/2022 0430  ? NA 141 07/19/2020 1209  ? K 3.9 01/09/2022 0430  ? CL 109 01/09/2022 0430  ? CO2 26  01/09/2022 0430  ? GLUCOSE 113 (H) 01/09/2022 0430  ? BUN 11 01/09/2022 0430  ? BUN 15 07/19/2020 1209  ? CREATININE 0.97 01/09/2022 0430  ? CALCIUM 8.1 (L) 01/09/2022 0430  ? GFRNONAA >60 01/09/2022 0430  ? GFRAA 89 07/19/2020 1209  ? ?COAG ?Lab Results  ?Component Value Date  ? INR 1.06 04/14/2016  ? ? ? ?Disposition:  ?Discharge to :Home ? ?Allergies as of 01/10/2022    ?No Known Allergies ?  ? ?  ?Medication List  ?  ? ?TAKE these medications   ? ?aspirin EC 81 MG tablet ?Take 1 tablet (81 mg total) by mouth daily. Swallow whole. ?What changed: when to take this ?  ?buPROPion 300 MG 24 hr tablet ?Commonly known as: WELLBUTRIN XL ?Take 300 mg by mouth at bedtime. ?  ?citalopram 40 MG tablet ?Commonly known as: CELEXA ?Take 40 mg by mouth at bedtime. ?  ?clopidogrel 75 MG tablet ?Commonly known as: PLAVIX ?Take 75 mg by mouth daily. ?  ?isosorbide mononitrate 60 MG 24 hr tablet ?Commonly known as: IMDUR ?TAKE 1 TABLET BY MOUTH EVERY DAY ?What changed:  ?how to take this ?when to take this ?  ?metoprolol succinate 25 MG 24 hr tablet ?Commonly known as: TOPROL-XL ?TAKE 1 TABLET (25 MG TOTAL) BY MOUTH DAILY. ?What changed: when to take this ?  ?nitroGLYCERIN 0.4 MG SL tablet ?Commonly known as: NITROSTAT ?Place 1 tablet (0.4 mg total) under the tongue every 5 (five) minutes as needed. ?  ?oxyCODONE 5 MG immediate release tablet ?Commonly known as: Oxy IR/ROXICODONE ?Take 1-2 tablets (5-10 mg total) by mouth every 4 (four) hours as needed for moderate pain. ?  ?pantoprazole 40 MG tablet ?Commonly known as: Protonix ?Take 1 tablet (40 mg total) by mouth daily. ?What changed: when to take this ?  ?rosuvastatin 40 MG tablet ?Commonly known as: CRESTOR ?TAKE 1 TABLET BY MOUTH EVERY DAY ?What changed: when to take this ?  ? ?  ? ?Verbal and written Discharge instructions given to the patient. Wound care per Discharge AVS ? Follow-up Information   ? ? Schnier, Dolores Lory, MD Follow up in 3 week(s).   ?Specialties: Vascular Surgery, Cardiology, Radiology, Vascular Surgery ?Why: In 3 weeks with Carotid ?Contact information: ?[address] ?[place] [address] ?[phone] ? ? ?  ?  ? ?  ?  ? ?  ? ? ?Signed: ?Kris Hartmann, NP ? ?01/10/2022, 10:06 AM ? ?  ? ?

## 2022-01-10 NOTE — Progress Notes (Signed)
Mobility Specialist - Progress Note ? ? 01/10/22 1100  ?Mobility  ?Activity Ambulated independently in hallway  ?Level of Assistance Independent  ?Assistive Device None  ?Distance Ambulated (ft) 480 ft  ?Activity Response Tolerated well  ?$Mobility charge 1 Mobility  ? ? ? ?Pt ambulated hallway independently. No complaints.  ? ? ?Kathee Delton ?Mobility Specialist ?01/10/22, 11:46 AM ? ? ? ? ?

## 2022-01-14 ENCOUNTER — Ambulatory Visit: Payer: BC Managed Care – PPO | Admitting: Cardiovascular Disease

## 2022-01-14 NOTE — Progress Notes (Unsigned)
?  ?Cardiology Office Note ? ? ?Date:  01/14/2022  ? ?ID:  Carlos Diaz, DOB August 07, 1961, MRN [id number] ? ?PCP:  Valera Castle, MD  ?Cardiologist:   Kathlyn Sacramento, MD  ? ?No chief complaint on file. ? ? ?  ?History of Present Illness: ?Carlos Diaz is a 61 y.o. male who presents for a follow-up visit regarding coronary artery disease. ?He has known history of coronary artery disease and presented with non-ST elevation myocardial infarction in 2017.  He was found to have high-grade stenosis in the ramus intermedius which was treated with PCI and drug-eluting stent placement.  He had recurrent anginal symptoms in April 2020.  Repeat cardiac catheterization showed patent ramus stent.  There was high-grade stenosis in proximal OM branch which was treated successfully with PCI and drug-eluting stent placement. There was also moderate proximal to mid LAD disease and mild RCA disease.  Other medical problems include essential hypertension and hyperlipidemia.  He is not a smoker and has no family history of coronary artery disease.  He works at [employer]. ? ?Repeat cardiac catheterization was performed in October 2021 due to atypical chest pain and abnormal nuclear stress test. It showed patent stents in the ramus and OM 2 with no significant restenosis.  There was stable moderate proximal LAD and mid RCA disease.  Ejection fraction and LVEDP were both normal. ? ?He had another [employer] in March of this year that showed no evidence of ischemia with normal ejection fraction. ? ?He underwent recent left carotid endarterectomy for asymptomatic high-grade stenosis. ? ?Past Medical History:  ?Diagnosis Date  ? Anxiety   ? Arthritis   ? Carotid artery disease ([place])   ? a.) Carotid doppler 11/14/2021: 7-61% RICA; 60-73% LICA; < 71% CCA and ECA. c.) CTA neck 12/04/2021: 70-80% stenosis of prox LICA; mild plaquing in prox RICA without stenosis.  ? Coronary artery disease 04/14/2016  ? a.) LHC 04/14/2016: 95%  RI, 50% oOM2-OM2, 50% p-mLAD --> PCI performed placing a 2.25 x 15 mm Xience Alpine DES to RI. b.) LHC 12/23/2018: 95% OM1, 40% pLAD, 40% mRCA --> PCI performed placing a 2.25 x 15 mm Resolute Onxy DES to oOM1. c.) LHC 06/25/2020: EF 55-65%; LVEDP norm; 40% p-mRCA, 20% pRCA, 50% p-mLAD, 30% OM2-2; RI and OM2-1 stents patent; intervention deferred opting for medical mgmt.  ? Depression   ? Diastolic dysfunction 02/25/9484  ? a.) TTE 04/14/2016: EF 65%; mild LA dil, G1DD. b.) stress echo 06/26/2016: EF >55%; triv panvalvular regurg, max workload 13.70 METS. c.) TTE 11/23/2018: EF >%%; mild panvalvular regurg; fibrocalcified AoV without stenosis; G1DD.  ? GERD (gastroesophageal reflux disease)   ? Hyperlipidemia   ? Hypertension   ? Long term current use of antithrombotics/antiplatelets   ? a.) on daily DAPT therapy (ASA + clopidogrel)  ? Lyme disease   ? NSTEMI (non-ST elevated myocardial infarction) ([place]) 04/13/2016  ? a.) LHC 04/14/2016 --> 95% RI, 50% oOM2-OM2, 50% p-mLAD --> PCI performed placing a 2.25 x 15 mm Xience Alpine DES to the RI  ? Tinnitus   ? Unstable angina ([facility])   ? ? ?Past Surgical History:  ?Procedure Laterality Date  ? CARDIAC CATHETERIZATION Right 04/14/2016  ? Procedure: Left Heart Cath and Coronary Angiography;  Surgeon: Dionisio Yaroslav, MD;  Location: [place] [facility];  Service: Cardiovascular;  Laterality: Right;  ? CARDIAC CATHETERIZATION N/A 04/14/2016  ? Procedure: Coronary Stent Intervention;  Surgeon: Yolonda Kida, MD;  Location: [place] [facility];  Service: Cardiovascular;  Laterality: N/A;  ? COLONOSCOPY    ? COLONOSCOPY WITH PROPOFOL N/A 03/26/2020  ? Procedure: COLONOSCOPY WITH PROPOFOL;  Surgeon: Lesly Rubenstein, MD;  Location: [facility];  Service: Endoscopy;  Laterality: N/A;  ? CORONARY STENT INTERVENTION N/A 12/23/2018  ? Procedure: CORONARY STENT INTERVENTION;  Surgeon: Isaias Cowman, MD;  Location: [place] [facility];  Service: Cardiovascular;   Laterality: N/A;  ? ENDARTERECTOMY Left 01/08/2022  ? Procedure: ENDARTERECTOMY CAROTID;  Surgeon: Katha Cabal, MD;  Location: [facility];  Service: Vascular;  Laterality: Left;  ? LEFT HEART CATH AND CORONARY ANGIOGRAPHY Left 12/23/2018  ? Procedure: LEFT HEART CATH AND CORONARY ANGIOGRAPHY;  Surgeon: Dionisio Yehia, MD;  Location: [place] [facility];  Service: Cardiovascular;  Laterality: Left;  ? LEFT HEART CATH AND CORONARY ANGIOGRAPHY Left 06/25/2020  ? Procedure: LEFT HEART CATH AND CORONARY ANGIOGRAPHY;  Surgeon: Wellington Hampshire, MD;  Location: [place] [facility];  Service: Cardiovascular;  Laterality: Left;  ? PORT-A-CATH REMOVAL    ? PORTACATH PLACEMENT    ? ? ? ?Current Outpatient Medications  ?Medication Sig Dispense Refill  ? aspirin EC 81 MG tablet Take 1 tablet (81 mg total) by mouth daily. Swallow whole. (Patient taking differently: Take 81 mg by mouth every morning. Swallow whole.) 90 tablet 3  ? buPROPion (WELLBUTRIN XL) 300 MG 24 hr tablet Take 300 mg by mouth at bedtime.    ? citalopram (CELEXA) 40 MG tablet Take 40 mg by mouth at bedtime.    ? clopidogrel (PLAVIX) 75 MG tablet Take 75 mg by mouth daily.    ? isosorbide mononitrate (IMDUR) 60 MG 24 hr tablet TAKE 1 TABLET BY MOUTH EVERY DAY 90 tablet 1  ? metoprolol succinate (TOPROL-XL) 25 MG 24 hr tablet TAKE 1 TABLET (25 MG TOTAL) BY MOUTH DAILY. (Patient taking differently: Take 25 mg by mouth at bedtime.) 90 tablet 0  ? nitroGLYCERIN (NITROSTAT) 0.4 MG SL tablet Place 1 tablet (0.4 mg total) under the tongue every 5 (five) minutes as needed. 25 tablet 3  ? oxyCODONE (OXY IR/ROXICODONE) 5 MG immediate release tablet Take 1-2 tablets (5-10 mg total) by mouth every 4 (four) hours as needed for moderate pain. 30 tablet 0  ? pantoprazole (PROTONIX) 40 MG tablet Take 1 tablet (40 mg total) by mouth daily. (Patient taking differently: Take 40 mg by mouth at bedtime.) 30 tablet 0  ? rosuvastatin (CRESTOR) 40 MG tablet TAKE 1 TABLET BY  MOUTH EVERY DAY (Patient taking differently: Take 40 mg by mouth at bedtime.) 90 tablet 2  ? ?No current facility-administered medications for this visit.  ? ? ?Allergies:   Patient has no known allergies.  ? ? ?Social History:  The patient  reports that he has never smoked. He has never used smokeless tobacco. He reports current alcohol use. He reports that he does not use drugs.  ? ?Family History:  The patient's family history includes CAD in his mother.  ? ? ?ROS:  Please see the history of present illness.   Otherwise, review of systems are positive for none.   All other systems are reviewed and negative.  ? ? ?PHYSICAL EXAM: ?VS:  There were no vitals taken for this visit. , BMI There is no height or weight on file to calculate BMI. ?GEN: Well nourished, well developed, in no acute distress  ?HEENT: normal  ?Neck: no JVD, carotid bruits, or masses ?Cardiac: RRR; no murmurs, rubs, or gallops,no edema  ?Respiratory:  clear to auscultation bilaterally, normal work of breathing ?GI: soft, nontender, nondistended, + BS ?MS: no deformity or atrophy  ?Skin: warm and dry, no rash ?Neuro:  Strength and sensation are intact ?Psych: euthymic mood, full affect ? ? ?EKG:  EKG is ordered today. ?The ekg ordered today demonstrates sinus bradycardia with no significant ST or T wave changes. ? ? ?Recent Labs: ?09/25/2021: ALT 20 ?01/09/2022: BUN 11; Creatinine, Ser 0.97; Hemoglobin 11.9; Platelets 149; Potassium 3.9; Sodium 139  ? ? ?Lipid Panel ?   ?Component Value Date/Time  ? CHOL 122 06/04/2020 1549  ? TRIG 103 06/04/2020 1549  ? HDL 43 06/04/2020 1549  ? CHOLHDL 2.8 06/04/2020 1549  ? CHOLHDL 5.5 04/14/2016 0623  ? VLDL 31 04/14/2016 0623  ? [place] 60 06/04/2020 1549  ? LDLDIRECT 61 06/04/2020 1549  ? ?  ? ?Wt Readings from Last 3 Encounters:  ?01/08/22 268 lb 4.8 oz (121.7 kg)  ?12/13/21 234 lb (106.1 kg)  ?12/09/21 240 lb (108.9 kg)  ?  ? ? ?   ? View : No data to display.  ?  ?  ?  ? ? ? ? ?ASSESSMENT AND PLAN: ? ?1.  Coronary artery disease involving native coronary arteries without angina: He is doing well overall.  No recent chest pain or shortness of breath.  Recommend continuing medical therapy.  Dual antiplatelet therap

## 2022-01-29 ENCOUNTER — Other Ambulatory Visit (INDEPENDENT_AMBULATORY_CARE_PROVIDER_SITE_OTHER): Payer: Self-pay | Admitting: Vascular Surgery

## 2022-01-29 DIAGNOSIS — I6523 Occlusion and stenosis of bilateral carotid arteries: Secondary | ICD-10-CM

## 2022-01-29 DIAGNOSIS — Z9889 Other specified postprocedural states: Secondary | ICD-10-CM

## 2022-01-30 ENCOUNTER — Encounter (INDEPENDENT_AMBULATORY_CARE_PROVIDER_SITE_OTHER): Payer: Self-pay | Admitting: Nurse Practitioner

## 2022-01-30 ENCOUNTER — Ambulatory Visit (INDEPENDENT_AMBULATORY_CARE_PROVIDER_SITE_OTHER): Payer: BC Managed Care – PPO | Admitting: Nurse Practitioner

## 2022-01-30 ENCOUNTER — Ambulatory Visit (INDEPENDENT_AMBULATORY_CARE_PROVIDER_SITE_OTHER): Payer: BC Managed Care – PPO

## 2022-01-30 DIAGNOSIS — Z9889 Other specified postprocedural states: Secondary | ICD-10-CM

## 2022-01-30 DIAGNOSIS — I6523 Occlusion and stenosis of bilateral carotid arteries: Secondary | ICD-10-CM

## 2022-02-09 ENCOUNTER — Encounter (INDEPENDENT_AMBULATORY_CARE_PROVIDER_SITE_OTHER): Payer: Self-pay | Admitting: Nurse Practitioner

## 2022-02-09 NOTE — Progress Notes (Signed)
Subjective:    Patient ID: Carlos Diaz, male    DOB: 21-Jun-1961, 61 y.o.   MRN: [id number] No chief complaint on file.   The patient is seen for follow up evaluation of carotid stenosis status post left carotid endarterectomy on 01/08/2022.  There were no post operative problems or complications related to the surgery.  The patient denies neck or incisional pain.  The patient denies interval amaurosis fugax. There is no recent history of TIA symptoms or focal motor deficits. There is no prior documented CVA.  The patient initially had a headache but it has greatly improved.  The patient is taking enteric-coated aspirin 81 mg daily.  No recent shortening of the patient's walking distance or new symptoms consistent with claudication.  No history of rest pain symptoms. No new ulcers or wounds of the lower extremities have occurred.  There is no history of DVT, PE or superficial thrombophlebitis. No recent episodes of angina or shortness of breath documented.    Today noninvasive studies show patency of the left carotid with mild postop swelling at the bifurcation.  Velocities are consistent with a 1 to 39% stenosis.  The right ICA is near normal but with velocities also in the 1 to 39% range.    Review of Systems  Skin:  Positive for wound.  All other systems reviewed and are negative.      Objective:   Physical Exam Vitals reviewed.  HENT:     Head: Normocephalic.  Cardiovascular:     Rate and Rhythm: Normal rate.     Pulses: Normal pulses.  Pulmonary:     Effort: Pulmonary effort is normal.  Skin:    General: Skin is warm and dry.  Neurological:     Mental Status: He is alert and oriented to person, place, and time.  Psychiatric:        Mood and Affect: Mood normal.        Behavior: Behavior normal.        Thought Content: Thought content normal.        Judgment: Judgment normal.     BP (!) 146/91 (BP Location: Left Arm)   Pulse 61   Resp 17   Ht 6' (1.829 m)    Wt 225 lb (102.1 kg)   BMI 30.52 kg/m   Past Medical History:  Diagnosis Date   Anxiety    Arthritis    Carotid artery disease ([place])    a.) Carotid doppler 11/14/2021: 2-11% RICA; 94-17% LICA; < 40% CCA and ECA. c.) CTA neck 12/04/2021: 70-80% stenosis of prox LICA; mild plaquing in prox RICA without stenosis.   Coronary artery disease 04/14/2016   a.) LHC 04/14/2016: 95% RI, 50% oOM2-OM2, 50% p-mLAD --> PCI performed placing a 2.25 x 15 mm Xience Alpine DES to RI. b.) LHC 12/23/2018: 95% OM1, 40% pLAD, 40% mRCA --> PCI performed placing a 2.25 x 15 mm Resolute Onxy DES to oOM1. c.) LHC 06/25/2020: EF 55-65%; LVEDP norm; 40% p-mRCA, 20% pRCA, 50% p-mLAD, 30% OM2-2; RI and OM2-1 stents patent; intervention deferred opting for medical mgmt.   Depression    Diastolic dysfunction 81/44/8185   a.) TTE 04/14/2016: EF 65%; mild LA dil, G1DD. b.) stress echo 06/26/2016: EF >55%; triv panvalvular regurg, max workload 13.70 METS. c.) TTE 11/23/2018: EF >%%; mild panvalvular regurg; fibrocalcified AoV without stenosis; G1DD.   GERD (gastroesophageal reflux disease)    Hyperlipidemia    Hypertension    Long term current use of antithrombotics/antiplatelets  a.) on daily DAPT therapy (ASA + clopidogrel)   Lyme disease    NSTEMI (non-ST elevated myocardial infarction) ([place]) 04/13/2016   a.) LHC 04/14/2016 --> 95% RI, 50% oOM2-OM2, 50% p-mLAD --> PCI performed placing a 2.25 x 15 mm Xience Alpine DES to the RI   Tinnitus    Unstable angina ([place])     Social History   Socioeconomic History   Marital status: Married    Spouse name: Not on file   Number of children: Not on file   Years of education: Not on file   Highest education level: Not on file  Occupational History   Not on file  Tobacco Use   Smoking status: Never   Smokeless tobacco: Never  Vaping Use   Vaping Use: Never used  Substance and Sexual Activity   Alcohol use: Yes    Comment: occ beer   Drug use: No   Sexual  activity: Not on file  Other Topics Concern   Not on file  Social History Narrative   Lives at home with wife and daughter.   Social Determinants of Health   Financial Resource Strain: Not on file  Food Insecurity: Not on file  Transportation Needs: Not on file  Physical Activity: Not on file  Stress: Not on file  Social Connections: Not on file  Intimate Partner Violence: Not on file    Past Surgical History:  Procedure Laterality Date   CARDIAC CATHETERIZATION Right 04/14/2016   Procedure: Left Heart Cath and Coronary Angiography;  Surgeon: Dionisio Trevis, MD;  Location: [place] [facility];  Service: Cardiovascular;  Laterality: Right;   CARDIAC CATHETERIZATION N/A 04/14/2016   Procedure: Coronary Stent Intervention;  Surgeon: Yolonda Kida, MD;  Location: [place] [facility];  Service: Cardiovascular;  Laterality: N/A;   COLONOSCOPY     COLONOSCOPY WITH PROPOFOL N/A 03/26/2020   Procedure: COLONOSCOPY WITH PROPOFOL;  Surgeon: Lesly Rubenstein, MD;  Location: [facility];  Service: Endoscopy;  Laterality: N/A;   CORONARY STENT INTERVENTION N/A 12/23/2018   Procedure: CORONARY STENT INTERVENTION;  Surgeon: Isaias Cowman, MD;  Location: [place] [facility];  Service: Cardiovascular;  Laterality: N/A;   ENDARTERECTOMY Left 01/08/2022   Procedure: ENDARTERECTOMY CAROTID;  Surgeon: Katha Cabal, MD;  Location: [facility];  Service: Vascular;  Laterality: Left;   LEFT HEART CATH AND CORONARY ANGIOGRAPHY Left 12/23/2018   Procedure: LEFT HEART CATH AND CORONARY ANGIOGRAPHY;  Surgeon: Dionisio Issaih, MD;  Location: [place] [facility];  Service: Cardiovascular;  Laterality: Left;   LEFT HEART CATH AND CORONARY ANGIOGRAPHY Left 06/25/2020   Procedure: LEFT HEART CATH AND CORONARY ANGIOGRAPHY;  Surgeon: Wellington Hampshire, MD;  Location: [place] [facility];  Service: Cardiovascular;  Laterality: Left;   PORT-A-CATH REMOVAL     PORTACATH PLACEMENT      Family  History  Problem Relation Age of Onset   CAD Mother     No Known Allergies     Latest Ref Rng & Units 01/09/2022    4:30 AM 01/06/2022    3:00 PM 09/25/2021    6:35 PM  CBC  WBC 4.0 - 10.5 K/uL 9.0  8.1  13.9   Hemoglobin 13.0 - 17.0 g/dL 11.9  12.5  13.5   Hematocrit 39.0 - 52.0 % 36.5  37.2  39.8   Platelets 150 - 400 K/uL 149  168  183       CMP     Component Value Date/Time  NA 139 01/09/2022 0430   NA 141 07/19/2020 1209   K 3.9 01/09/2022 0430   CL 109 01/09/2022 0430   CO2 26 01/09/2022 0430   GLUCOSE 113 (H) 01/09/2022 0430   BUN 11 01/09/2022 0430   BUN 15 07/19/2020 1209   CREATININE 0.97 01/09/2022 0430   CALCIUM 8.1 (L) 01/09/2022 0430   PROT 7.2 09/25/2021 1850   PROT 6.7 04/01/2018 0803   ALBUMIN 4.3 09/25/2021 1850   ALBUMIN 4.4 04/01/2018 0803   AST 19 09/25/2021 1850   ALT 20 09/25/2021 1850   ALKPHOS 42 09/25/2021 1850   BILITOT 1.0 09/25/2021 1850   BILITOT 0.6 04/01/2018 0803   GFRNONAA >60 01/09/2022 0430   GFRAA 89 07/19/2020 1209     No results found.     Assessment & Plan:   1. Bilateral carotid artery stenosis Recommend:  The patient is s/p successful left CEA  Duplex ultrasound preoperatively shows <40% contralateral stenosis.  Continue antiplatelet therapy as prescribed Continue management of CAD, HTN and Hyperlipidemia Healthy heart diet,  encouraged exercise at least 4 times per week  Follow up in 3 months with duplex ultrasound and physical exam based on the patient's carotid surgery  - VAS US CAROTID    Current Outpatient Medications on File Prior to Visit  Medication Sig Dispense Refill   aspirin EC 81 MG tablet Take 1 tablet (81 mg total) by mouth daily. Swallow whole. (Patient taking differently: Take 81 mg by mouth every morning. Swallow whole.) 90 tablet 3   buPROPion (WELLBUTRIN XL) 300 MG 24 hr tablet Take 300 mg by mouth at bedtime.     citalopram (CELEXA) 40 MG tablet Take 40 mg by mouth at bedtime.      clopidogrel (PLAVIX) 75 MG tablet Take 75 mg by mouth daily.     isosorbide mononitrate (IMDUR) 60 MG 24 hr tablet TAKE 1 TABLET BY MOUTH EVERY DAY 90 tablet 1   metoprolol succinate (TOPROL-XL) 25 MG 24 hr tablet TAKE 1 TABLET (25 MG TOTAL) BY MOUTH DAILY. (Patient taking differently: Take 25 mg by mouth at bedtime.) 90 tablet 0   nitroGLYCERIN (NITROSTAT) 0.4 MG SL tablet Place 1 tablet (0.4 mg total) under the tongue every 5 (five) minutes as needed. 25 tablet 3   pantoprazole (PROTONIX) 40 MG tablet Take 1 tablet (40 mg total) by mouth daily. (Patient taking differently: Take 40 mg by mouth at bedtime.) 30 tablet 0   rosuvastatin (CRESTOR) 40 MG tablet TAKE 1 TABLET BY MOUTH EVERY DAY (Patient taking differently: Take 40 mg by mouth at bedtime.) 90 tablet 2   oxyCODONE (OXY IR/ROXICODONE) 5 MG immediate release tablet Take 1-2 tablets (5-10 mg total) by mouth every 4 (four) hours as needed for moderate pain. (Patient not taking: Reported on 01/30/2022) 30 tablet 0   No current facility-administered medications on file prior to visit.    There are no Patient Instructions on file for this visit. No follow-ups on file.   Kris Hartmann, NP

## 2022-02-24 ENCOUNTER — Telehealth (INDEPENDENT_AMBULATORY_CARE_PROVIDER_SITE_OTHER): Payer: Self-pay

## 2022-02-24 NOTE — Telephone Encounter (Signed)
Patient was made aware with medical advice and verbalized understanding 

## 2022-03-06 ENCOUNTER — Ambulatory Visit
Admission: RE | Admit: 2022-03-06 | Discharge: 2022-03-06 | Disposition: A | Discharge status: Home / Self Care | Payer: BC Managed Care – PPO | Source: Ambulatory Visit | Attending: Physician Assistant | Admitting: Physician Assistant

## 2022-03-06 DIAGNOSIS — R918 Other nonspecific abnormal finding of lung field: Secondary | ICD-10-CM | POA: Insufficient documentation

## 2022-03-11 ENCOUNTER — Other Ambulatory Visit: Payer: Self-pay | Admitting: *Deleted

## 2022-03-11 DIAGNOSIS — R911 Solitary pulmonary nodule: Secondary | ICD-10-CM

## 2022-03-11 DIAGNOSIS — R590 Localized enlarged lymph nodes: Secondary | ICD-10-CM

## 2022-03-11 DIAGNOSIS — I7121 Aneurysm of the ascending aorta, without rupture: Secondary | ICD-10-CM

## 2022-03-18 ENCOUNTER — Other Ambulatory Visit: Payer: Self-pay | Admitting: Cardiovascular Disease

## 2022-04-24 ENCOUNTER — Other Ambulatory Visit: Payer: Self-pay | Admitting: Cardiovascular Disease

## 2022-05-05 ENCOUNTER — Ambulatory Visit (INDEPENDENT_AMBULATORY_CARE_PROVIDER_SITE_OTHER): Payer: Self-pay | Admitting: Physician Assistant

## 2022-05-05 DIAGNOSIS — U071 COVID-19: Secondary | ICD-10-CM

## 2022-05-05 NOTE — Progress Notes (Signed)
S: 61 y/o M is seen as a telehealth visit. Pt reports testing positive for covid earlier today. His symptoms started 3 days ago with body aches, congestion, sore throat. Pt denies fever. Denies CP or SOB. Today he feels better than the onset of symptoms. Pt is vaccinated and boosted.   A/P:Stay well hydrated.  Take otc sinus cold and flu medicine as directed on the bottle.  May take Tylenol or Ibuprofen as directed for body aches or pain.  Reviewed CDC guidelines with patient. Pt will be isolated for 5 days from the start of symptoms and use a mask for an additional 5 days. Must be fever free for 24 hours prior to returning to campus. Pt verbalized understanding and in agreement.  Pt will contact us if symptoms don't improve or worsen.  Today's visit is a 10 minute telephonic encounter.

## 2022-05-06 ENCOUNTER — Other Ambulatory Visit (INDEPENDENT_AMBULATORY_CARE_PROVIDER_SITE_OTHER): Payer: Self-pay | Admitting: Nurse Practitioner

## 2022-05-06 DIAGNOSIS — I6523 Occlusion and stenosis of bilateral carotid arteries: Secondary | ICD-10-CM

## 2022-05-08 ENCOUNTER — Ambulatory Visit (INDEPENDENT_AMBULATORY_CARE_PROVIDER_SITE_OTHER): Payer: BC Managed Care – PPO | Admitting: Vascular Surgery

## 2022-05-08 ENCOUNTER — Ambulatory Visit (INDEPENDENT_AMBULATORY_CARE_PROVIDER_SITE_OTHER): Payer: BC Managed Care – PPO

## 2022-05-08 ENCOUNTER — Encounter (INDEPENDENT_AMBULATORY_CARE_PROVIDER_SITE_OTHER): Payer: Self-pay | Admitting: Vascular Surgery

## 2022-05-08 VITALS — BP 144/88 | HR 65 | Resp 17 | Ht 72.0 in | Wt 232.0 lb

## 2022-05-08 DIAGNOSIS — E782 Mixed hyperlipidemia: Secondary | ICD-10-CM | POA: Diagnosis not present

## 2022-05-08 DIAGNOSIS — I6523 Occlusion and stenosis of bilateral carotid arteries: Secondary | ICD-10-CM | POA: Diagnosis not present

## 2022-05-08 DIAGNOSIS — I10 Essential (primary) hypertension: Secondary | ICD-10-CM | POA: Diagnosis not present

## 2022-05-08 DIAGNOSIS — I25118 Atherosclerotic heart disease of native coronary artery with other forms of angina pectoris: Secondary | ICD-10-CM

## 2022-05-10 ENCOUNTER — Encounter (INDEPENDENT_AMBULATORY_CARE_PROVIDER_SITE_OTHER): Payer: Self-pay | Admitting: Vascular Surgery

## 2022-05-10 NOTE — Progress Notes (Signed)
MRN : [id number]  Carlos Diaz is a 61 y.o. (06/03/1961) male who presents with chief complaint of check carotid arteries.  History of Present Illness:   The patient is seen for follow up evaluation of carotid stenosis status post left carotid endarterectomy on 01/08/2022.  There were no post operative problems or complications related to the surgery.  The patient denies neck or incisional pain.  The patient denies interval amaurosis fugax. There is no recent history of TIA symptoms or focal motor deficits. There is no prior documented CVA.  The patient denies headache.  The patient is taking enteric-coated aspirin 81 mg daily.  No recent shortening of the patient's walking distance or new symptoms consistent with claudication.  No history of rest pain symptoms. No new ulcers or wounds of the lower extremities have occurred.  There is no history of DVT, PE or superficial thrombophlebitis. No recent episodes of angina or shortness of breath documented.    Duplex ultrasound of the bilateral carotid arteries shows 1-39% bilateral carotid artery stenosis.  Current Meds  Medication Sig   aspirin EC 81 MG tablet Take 1 tablet (81 mg total) by mouth daily. Swallow whole. (Patient taking differently: Take 81 mg by mouth every morning. Swallow whole.)   buPROPion (WELLBUTRIN XL) 300 MG 24 hr tablet Take 300 mg by mouth at bedtime.   citalopram (CELEXA) 40 MG tablet Take 40 mg by mouth at bedtime.   clopidogrel (PLAVIX) 75 MG tablet Take 75 mg by mouth daily.   isosorbide mononitrate (IMDUR) 60 MG 24 hr tablet TAKE 1 TABLET BY MOUTH EVERY DAY   metoprolol succinate (TOPROL-XL) 25 MG 24 hr tablet TAKE 1 TABLET (25 MG TOTAL) BY MOUTH DAILY.   nitroGLYCERIN (NITROSTAT) 0.4 MG SL tablet PLACE 1 TABLET UNDER THE TONGUE EVERY 5 MINUTES AS NEEDED.   pantoprazole (PROTONIX) 40 MG tablet Take 1 tablet (40 mg total) by mouth daily. (Patient taking differently: Take 40 mg by mouth at bedtime.)    rosuvastatin (CRESTOR) 40 MG tablet TAKE 1 TABLET BY MOUTH EVERY DAY (Patient taking differently: Take 40 mg by mouth at bedtime.)    Past Medical History:  Diagnosis Date   Anxiety    Arthritis    Carotid artery disease ([place])    a.) Carotid doppler 11/14/2021: 2-83% RICA; 15-17% LICA; < 61% CCA and ECA. c.) CTA neck 12/04/2021: 70-80% stenosis of prox LICA; mild plaquing in prox RICA without stenosis.   Coronary artery disease 04/14/2016   a.) LHC 04/14/2016: 95% RI, 50% oOM2-OM2, 50% p-mLAD --> PCI performed placing a 2.25 x 15 mm Xience Alpine DES to RI. b.) LHC 12/23/2018: 95% OM1, 40% pLAD, 40% mRCA --> PCI performed placing a 2.25 x 15 mm Resolute Onxy DES to oOM1. c.) LHC 06/25/2020: EF 55-65%; LVEDP norm; 40% p-mRCA, 20% pRCA, 50% p-mLAD, 30% OM2-2; RI and OM2-1 stents patent; intervention deferred opting for medical mgmt.   Depression    Diastolic dysfunction 60/73/7106   a.) TTE 04/14/2016: EF 65%; mild LA dil, G1DD. b.) stress echo 06/26/2016: EF >55%; triv panvalvular regurg, max workload 13.70 METS. c.) TTE 11/23/2018: EF >%%; mild panvalvular regurg; fibrocalcified AoV without stenosis; G1DD.   GERD (gastroesophageal reflux disease)    Hyperlipidemia    Hypertension    Long term current use of antithrombotics/antiplatelets    a.) on daily DAPT therapy (ASA + clopidogrel)   Lyme disease    NSTEMI (non-ST elevated myocardial infarction) ([place]) 04/13/2016   a.) LHC  04/14/2016 --> 95% RI, 50% oOM2-OM2, 50% p-mLAD --> PCI performed placing a 2.25 x 15 mm Xience Alpine DES to the RI   Tinnitus    Unstable angina [facility])     Past Surgical History:  Procedure Laterality Date   CARDIAC CATHETERIZATION Right 04/14/2016   Procedure: Left Heart Cath and Coronary Angiography;  Surgeon: Dionisio Gino, MD;  Location: [place] [facility];  Service: Cardiovascular;  Laterality: Right;   CARDIAC CATHETERIZATION N/A 04/14/2016   Procedure: Coronary Stent Intervention;  Surgeon: Yolonda Kida, MD;  Location: [place] [facility];  Service: Cardiovascular;  Laterality: N/A;   COLONOSCOPY     COLONOSCOPY WITH PROPOFOL N/A 03/26/2020   Procedure: COLONOSCOPY WITH PROPOFOL;  Surgeon: Lesly Rubenstein, MD;  Location: [facility];  Service: Endoscopy;  Laterality: N/A;   CORONARY STENT INTERVENTION N/A 12/23/2018   Procedure: CORONARY STENT INTERVENTION;  Surgeon: Isaias Cowman, MD;  Location: [place] [facility];  Service: Cardiovascular;  Laterality: N/A;   ENDARTERECTOMY Left 01/08/2022   Procedure: ENDARTERECTOMY CAROTID;  Surgeon: Katha Cabal, MD;  Location: [facility] OR[facility];  Service: Vascular;  Laterality: Left;   LEFT HEART CATH AND CORONARY ANGIOGRAPHY Left 12/23/2018   Procedure: LEFT HEART CATH AND CORONARY ANGIOGRAPHY;  Surgeon: Dionisio Muhamad, MD;  Location: [place] [facility];  Service: Cardiovascular;  Laterality: Left;   LEFT HEART CATH AND CORONARY ANGIOGRAPHY Left 06/25/2020   Procedure: LEFT HEART CATH AND CORONARY ANGIOGRAPHY;  Surgeon: Wellington Hampshire, MD;  Location: [place] [facility];  Service: Cardiovascular;  Laterality: Left;   PORT-A-CATH REMOVAL     PORTACATH PLACEMENT      Social History Social History   Tobacco Use   Smoking status: Never   Smokeless tobacco: Never  Vaping Use   Vaping Use: Never used  Substance Use Topics   Alcohol use: Yes    Comment: occ beer   Drug use: No    Family History Family History  Problem Relation Age of Onset   CAD Mother     No Known Allergies   REVIEW OF SYSTEMS (Negative unless checked)  Constitutional: '[]'$ Weight loss  '[]'$ Fever  '[]'$ Chills Cardiac: '[]'$ Chest pain   '[]'$ Chest pressure   '[]'$ Palpitations   '[]'$ Shortness of breath when laying flat   '[]'$ Shortness of breath with exertion. Vascular:  '[x]'$ Pain in legs with walking   '[]'$ Pain in legs at rest  '[]'$ History of DVT   '[]'$ Phlebitis   '[]'$ Swelling in legs   '[]'$ Varicose veins   '[]'$ Non-healing ulcers Pulmonary:   '[]'$ Uses home oxygen   '[]'$ Productive  cough   '[]'$ Hemoptysis   '[]'$ Wheeze  '[]'$ COPD   '[]'$ Asthma Neurologic:  '[]'$ Dizziness   '[]'$ Seizures   '[]'$ History of stroke   '[]'$ History of TIA  '[]'$ Aphasia   '[]'$ Vissual changes   '[]'$ Weakness or numbness in arm   '[]'$ Weakness or numbness in leg Musculoskeletal:   '[]'$ Joint swelling   '[]'$ Joint pain   '[]'$ Low back pain Hematologic:  '[]'$ Easy bruising  '[]'$ Easy bleeding   '[]'$ Hypercoagulable state   '[]'$ Anemic Gastrointestinal:  '[]'$ Diarrhea   '[]'$ Vomiting  '[]'$ Gastroesophageal reflux/heartburn   '[]'$ Difficulty swallowing. Genitourinary:  '[]'$ Chronic kidney disease   '[]'$ Difficult urination  '[]'$ Frequent urination   '[]'$ Blood in urine Skin:  '[]'$ Rashes   '[]'$ Ulcers  Psychological:  '[]'$ History of anxiety   '[]'$  History of major depression.  Physical Examination  Vitals:   05/08/22 1549  BP: (!) 144/88  Pulse: 65  Resp: 17  Weight: 232 lb (105.2 kg)  Height: 6' (1.829 m)   Body  mass index is 31.46 kg/m. Gen: WD/WN, NAD Head: /AT, No temporalis wasting.  Ear/Nose/Throat: Hearing grossly intact, nares w/o erythema or drainage Eyes: PER, EOMI, sclera nonicteric.  Neck: Supple, no masses.  No bruit or JVD.  Pulmonary:  Good air movement, no audible wheezing, no use of accessory muscles.  Cardiac: RRR, normal S1, S2, no Murmurs. Vascular:  no carotid bruit noted, well healed cea incisional scar. Vessel Right Left  Radial Palpable Palpable  Carotid  Palpable  Palpable  Subclav  Palpable Palpable  Gastrointestinal: soft, non-distended. No guarding/no peritoneal signs.  Musculoskeletal: M/S 5/5 throughout.  No visible deformity.  Neurologic: CN 2-12 intact. Pain and light touch intact in extremities.  Symmetrical.  Speech is fluent. Motor exam as listed above. Psychiatric: Judgment intact, Mood & affect appropriate for pt's clinical situation. Dermatologic: No rashes or ulcers noted.  No changes consistent with cellulitis.   CBC Lab Results  Component Value Date   WBC 9.0 01/09/2022   HGB 11.9 (L) 01/09/2022   HCT 36.5 (L) 01/09/2022    MCV 91.9 01/09/2022   PLT 149 (L) 01/09/2022    BMET    Component Value Date/Time   NA 139 01/09/2022 0430   NA 141 07/19/2020 1209   K 3.9 01/09/2022 0430   CL 109 01/09/2022 0430   CO2 26 01/09/2022 0430   GLUCOSE 113 (H) 01/09/2022 0430   BUN 11 01/09/2022 0430   BUN 15 07/19/2020 1209   CREATININE 0.97 01/09/2022 0430   CALCIUM 8.1 (L) 01/09/2022 0430   GFRNONAA >60 01/09/2022 0430   GFRAA 89 07/19/2020 1209   CrCl cannot be calculated (Patient's most recent lab result is older than the maximum 21 days allowed.).  COAG Lab Results  Component Value Date   INR 1.06 04/14/2016    Radiology VAS US CAROTID  Result Date: 05/08/2022 Carotid Arterial Duplex Study Patient Name:  Carlos Diaz  Date of Exam:   05/08/2022 Medical Rec #: [id number]      Accession #:    [id number] Date of Birth: [birth year]       Patient Gender: M Patient Age:   89 years Exam Location:  [facility] Procedure:      VAS US CAROTID Referring Phys: Eulogio Ditch --------------------------------------------------------------------------------  Indications:       Carotid artery disease. Other Factors:     Lt CEA x 3 weeks. Comparison Study:  01/30/2022 Performing Technologist: Almira Coaster RVS  Examination Guidelines: A complete evaluation includes B-mode imaging, spectral Doppler, color Doppler, and power Doppler as needed of all accessible portions of each vessel. Bilateral testing is considered an integral part of a complete examination. Limited examinations for reoccurring indications may be performed as noted.  Right Carotid Findings: +----------+--------+--------+--------+------------------+--------+           PSV cm/sEDV cm/sStenosisPlaque DescriptionComments +----------+--------+--------+--------+------------------+--------+ CCA Prox  57      21                                         +----------+--------+--------+--------+------------------+--------+ CCA Mid   80      19                                          +----------+--------+--------+--------+------------------+--------+ CCA Distal70      21                                         +----------+--------+--------+--------+------------------+--------+  ICA Prox  59      21                                         +----------+--------+--------+--------+------------------+--------+ ICA Mid   45      18                                         +----------+--------+--------+--------+------------------+--------+ ICA Distal67      20                                         +----------+--------+--------+--------+------------------+--------+ ECA       79      12                                         +----------+--------+--------+--------+------------------+--------+ +----------+--------+-------+--------+-------------------+           PSV cm/sEDV cmsDescribeArm Pressure (mmHG) +----------+--------+-------+--------+-------------------+ [id number]      0                                  +----------+--------+-------+--------+-------------------+ +---------+--------+--+--------+-+ VertebralPSV cm/s29EDV cm/s8 +---------+--------+--+--------+-+  Left Carotid Findings: +----------+--------+--------+--------+------------------+--------+           PSV cm/sEDV cm/sStenosisPlaque DescriptionComments +----------+--------+--------+--------+------------------+--------+ CCA Prox  94      23                                         +----------+--------+--------+--------+------------------+--------+ CCA Mid   96      24                                         +----------+--------+--------+--------+------------------+--------+ CCA Distal80      21                                         +----------+--------+--------+--------+------------------+--------+ ICA Prox  77      20                                          +----------+--------+--------+--------+------------------+--------+ ICA Mid   77      31                                         +----------+--------+--------+--------+------------------+--------+ ICA Distal78      33                                         +----------+--------+--------+--------+------------------+--------+ ECA  69      13                                         +----------+--------+--------+--------+------------------+--------+ +----------+--------+--------+--------+-------------------+           PSV cm/sEDV cm/sDescribeArm Pressure (mmHG) +----------+--------+--------+--------+-------------------+ [id number]     0                                   +----------+--------+--------+--------+-------------------+ +---------+--------+--+--------+-+ VertebralPSV cm/s34EDV cm/s9 +---------+--------+--+--------+-+   Summary: Right Carotid: Velocities in the right ICA are consistent with a 1-39% stenosis. Left Carotid: Velocities in the left ICA are consistent with a 1-39% stenosis. Vertebrals: Bilateral vertebral arteries demonstrate antegrade flow. *See table(s) above for measurements and observations.  Electronically signed by [name] MD on 05/08/2022 at 4:29:09 PM.    Final      Assessment/Plan 1. Bilateral carotid artery stenosis Recommend:  The patient is s/p successful left CEA  Duplex ultrasound preoperatively shows 1-39% bilateral stenosis.  Continue antiplatelet therapy as prescribed Continue management of CAD, HTN and Hyperlipidemia Healthy heart diet,  encouraged exercise at least 4 times per week  Follow up in 3 months with duplex ultrasound and physical exam based on the patient's carotid surgery and bilateral 1-39% stenosis of the carotid arteries.   - VAS US CAROTID - VAS US CAROTID; Future  2. Primary hypertension Continue antihypertensive medications as already ordered, these medications have been reviewed and there are  no changes at this time.   3. Coronary artery disease of native artery of native heart with stable angina pectoris (HCC) Continue cardiac and antihypertensive medications as already ordered and reviewed, no changes at this time.  Continue statin as ordered and reviewed, no changes at this time  Nitrates PRN for chest pain   4. Mixed hyperlipidemia Continue statin as ordered and reviewed, no changes at this time     [name], MD  05/10/2022 12:25 PM

## 2022-05-15 DIAGNOSIS — I10 Essential (primary) hypertension: Secondary | ICD-10-CM | POA: Diagnosis not present

## 2022-05-15 DIAGNOSIS — E119 Type 2 diabetes mellitus without complications: Secondary | ICD-10-CM | POA: Diagnosis not present

## 2022-05-15 DIAGNOSIS — E7800 Pure hypercholesterolemia, unspecified: Secondary | ICD-10-CM | POA: Diagnosis not present

## 2022-05-15 DIAGNOSIS — I2510 Atherosclerotic heart disease of native coronary artery without angina pectoris: Secondary | ICD-10-CM | POA: Diagnosis not present

## 2022-05-15 DIAGNOSIS — K219 Gastro-esophageal reflux disease without esophagitis: Secondary | ICD-10-CM | POA: Diagnosis not present

## 2022-05-15 DIAGNOSIS — F418 Other specified anxiety disorders: Secondary | ICD-10-CM | POA: Diagnosis not present

## 2022-06-17 ENCOUNTER — Ambulatory Visit: Payer: BC Managed Care – PPO | Attending: Cardiovascular Disease | Admitting: Cardiovascular Disease

## 2022-06-17 ENCOUNTER — Encounter: Payer: Self-pay | Admitting: Cardiovascular Disease

## 2022-06-17 VITALS — BP 138/84 | HR 61 | Ht 71.0 in | Wt 231.6 lb

## 2022-06-17 DIAGNOSIS — I10 Essential (primary) hypertension: Secondary | ICD-10-CM | POA: Diagnosis not present

## 2022-06-17 DIAGNOSIS — E785 Hyperlipidemia, unspecified: Secondary | ICD-10-CM

## 2022-06-17 DIAGNOSIS — I2510 Atherosclerotic heart disease of native coronary artery without angina pectoris: Secondary | ICD-10-CM | POA: Diagnosis not present

## 2022-06-17 DIAGNOSIS — I7121 Aneurysm of the ascending aorta, without rupture: Secondary | ICD-10-CM

## 2022-06-17 DIAGNOSIS — I779 Disorder of arteries and arterioles, unspecified: Secondary | ICD-10-CM

## 2022-06-17 NOTE — Patient Instructions (Signed)
Medication Instructions:  - Your physician recommends that you continue on your current medications as directed. Please refer to the Current Medication list given to you today.  *If you need a refill on your cardiac medications before your next appointment, please call your pharmacy*   Lab Work: - none ordered  If you have labs (blood work) drawn today and your tests are completely normal, you will receive your results only by: MyChart Message (if you have MyChart) OR A paper copy in the mail If you have any lab test that is abnormal or we need to change your treatment, we will call you to review the results.   Testing/Procedures: - none ordered   Follow-Up: At [facility], you and your health needs are our priority.  As part of our continuing mission to provide you with exceptional heart care, we have created designated Provider Care Teams.  These Care Teams include your primary Cardiologist (physician) and Advanced Practice Providers (APPs -  Physician Assistants and Nurse Practitioners) who all work together to provide you with the care you need, when you need it.  We recommend signing up for the patient portal called "MyChart".  Sign up information is provided on this After Visit Summary.  MyChart is used to connect with patients for Virtual Visits (Telemedicine).  Patients are able to view lab/test results, encounter notes, upcoming appointments, etc.  Non-urgent messages can be sent to your provider as well.   To learn more about what you can do with MyChart, go to [email or web address].    Your next appointment:   6 month(s)  The format for your next appointment:   In Person  Provider:   You may see Muhammad Arida, MD or one of the following Advanced Practice Providers on your designated Care Team:   Christopher Berge, NP Ryan Dunn, PA-C Cadence Furth, PA-C Sheri Hammock, NP    Other Instructions N/a  Important Information About Sugar       

## 2022-06-17 NOTE — Progress Notes (Signed)
Cardiology Office Note   Date:  06/17/2022   ID:  Carlos Diaz, DOB 1961-08-24, MRN [id number]  PCP:  Valera Castle, MD  Cardiologist:   Kathlyn Sacramento, MD   Chief Complaint  Patient presents with   Follow-up    2 month f/u, no new cardiac concerns       History of Present Illness: Carlos Diaz is a 61 y.o. male who presents for a follow-up visit regarding coronary artery disease. He has known history of coronary artery disease and presented in 2017 with non-ST elevation myocardial infarction. He was found to have high-grade stenosis in the ramus intermedius which was treated with PCI and drug-eluting stent placement.  He had recurrent anginal symptoms in April 2020.  Repeat cardiac catheterization showed patent ramus stent.  There was high-grade stenosis in proximal OM branch which was treated successfully with PCI and drug-eluting stent placement. There was also moderate proximal to mid LAD disease and mild RCA disease.  Other medical problems include essential hypertension and hyperlipidemia.  He is not a smoker and has no family history of coronary artery disease.  He works at [employer].  Repeat cardiac catheterization was performed in October 2021 due to atypical chest pain and abnormal nuclear stress test. It showed patent stents in the ramus and OM 2 with no significant restenosis.  There was stable moderate proximal LAD and mid RCA disease.  Ejection fraction and LVEDP were both normal.  He had another [employer] in March of this year that showed no evidence of ischemia with normal ejection fraction.  He underwent recent left carotid endarterectomy for asymptomatic high-grade stenosis.  He has been doing well with no recent chest pain, shortness of breath or palpitations.  Past Medical History:  Diagnosis Date   Anxiety    Arthritis    Carotid artery disease ([place])    a.) Carotid doppler 11/14/2021: 7-06% RICA; 23-76% LICA; < 28% CCA and ECA. c.) CTA  neck 12/04/2021: 70-80% stenosis of prox LICA; mild plaquing in prox RICA without stenosis.   Coronary artery disease 04/14/2016   a.) LHC 04/14/2016: 95% RI, 50% oOM2-OM2, 50% p-mLAD --> PCI performed placing a 2.25 x 15 mm Xience Alpine DES to RI. b.) LHC 12/23/2018: 95% OM1, 40% pLAD, 40% mRCA --> PCI performed placing a 2.25 x 15 mm Resolute Onxy DES to oOM1. c.) LHC 06/25/2020: EF 55-65%; LVEDP norm; 40% p-mRCA, 20% pRCA, 50% p-mLAD, 30% OM2-2; RI and OM2-1 stents patent; intervention deferred opting for medical mgmt.   Depression    Diastolic dysfunction 31/51/7616   a.) TTE 04/14/2016: EF 65%; mild LA dil, G1DD. b.) stress echo 06/26/2016: EF >55%; triv panvalvular regurg, max workload 13.70 METS. c.) TTE 11/23/2018: EF >%%; mild panvalvular regurg; fibrocalcified AoV without stenosis; G1DD.   GERD (gastroesophageal reflux disease)    Hyperlipidemia    Hypertension    Long term current use of antithrombotics/antiplatelets    a.) on daily DAPT therapy (ASA + clopidogrel)   Lyme disease    NSTEMI (non-ST elevated myocardial infarction) ([place]) 04/13/2016   a.) LHC 04/14/2016 --> 95% RI, 50% oOM2-OM2, 50% p-mLAD --> PCI performed placing a 2.25 x 15 mm Xience Alpine DES to the RI   Tinnitus    Unstable angina [facility])     Past Surgical History:  Procedure Laterality Date   CARDIAC CATHETERIZATION Right 04/14/2016   Procedure: Left Heart Cath and Coronary Angiography;  Surgeon: Dionisio Atwell, MD;  Location: [place] [facility];  Service: Cardiovascular;  Laterality: Right;   CARDIAC CATHETERIZATION N/A 04/14/2016   Procedure: Coronary Stent Intervention;  Surgeon: Yolonda Kida, MD;  Location: [place] [facility];  Service: Cardiovascular;  Laterality: N/A;   COLONOSCOPY     COLONOSCOPY WITH PROPOFOL N/A 03/26/2020   Procedure: COLONOSCOPY WITH PROPOFOL;  Surgeon: Lesly Rubenstein, MD;  Location: [facility] ENDOS[facility];  Service: Endoscopy;  Laterality: N/A;   CORONARY STENT INTERVENTION  N/A 12/23/2018   Procedure: CORONARY STENT INTERVENTION;  Surgeon: Isaias Cowman, MD;  Location: [place] [facility];  Service: Cardiovascular;  Laterality: N/A;   ENDARTERECTOMY Left 01/08/2022   Procedure: ENDARTERECTOMY CAROTID;  Surgeon: Katha Cabal, MD;  Location: [facility] ORS;  Service: Vascular;  Laterality: Left;   LEFT HEART CATH AND CORONARY ANGIOGRAPHY Left 12/23/2018   Procedure: LEFT HEART CATH AND CORONARY ANGIOGRAPHY;  Surgeon: Dionisio Hannah, MD;  Location: [place] [facility];  Service: Cardiovascular;  Laterality: Left;   LEFT HEART CATH AND CORONARY ANGIOGRAPHY Left 06/25/2020   Procedure: LEFT HEART CATH AND CORONARY ANGIOGRAPHY;  Surgeon: Wellington Hampshire, MD;  Location: [place] [facility];  Service: Cardiovascular;  Laterality: Left;   PORT-A-CATH REMOVAL     PORTACATH PLACEMENT       Current Outpatient Medications  Medication Sig Dispense Refill   aspirin EC 81 MG tablet Take 1 tablet (81 mg total) by mouth daily. Swallow whole. (Patient taking differently: Take 81 mg by mouth every morning. Swallow whole.) 90 tablet 3   buPROPion (WELLBUTRIN XL) 300 MG 24 hr tablet Take 300 mg by mouth at bedtime.     citalopram (CELEXA) 40 MG tablet Take 40 mg by mouth at bedtime.     clopidogrel (PLAVIX) 75 MG tablet Take 75 mg by mouth daily.     isosorbide mononitrate (IMDUR) 60 MG 24 hr tablet TAKE 1 TABLET BY MOUTH EVERY DAY 90 tablet 1   metoprolol succinate (TOPROL-XL) 25 MG 24 hr tablet TAKE 1 TABLET (25 MG TOTAL) BY MOUTH DAILY. 90 tablet 1   pantoprazole (PROTONIX) 40 MG tablet Take 1 tablet (40 mg total) by mouth daily. (Patient taking differently: Take 40 mg by mouth at bedtime.) 30 tablet 0   rosuvastatin (CRESTOR) 40 MG tablet TAKE 1 TABLET BY MOUTH EVERY DAY (Patient taking differently: Take 40 mg by mouth at bedtime.) 90 tablet 2   nitroGLYCERIN (NITROSTAT) 0.4 MG SL tablet PLACE 1 TABLET UNDER THE TONGUE EVERY 5 MINUTES AS NEEDED. (Patient not taking:  Reported on 06/17/2022) 25 tablet 3   oxyCODONE (OXY IR/ROXICODONE) 5 MG immediate release tablet Take 1-2 tablets (5-10 mg total) by mouth every 4 (four) hours as needed for moderate pain. (Patient not taking: Reported on 01/30/2022) 30 tablet 0   No current facility-administered medications for this visit.    Allergies:   Patient has no known allergies.    Social History:  The patient  reports that he has never smoked. He has never used smokeless tobacco. He reports current alcohol use. He reports that he does not use drugs.   Family History:  The patient's family history includes CAD in his mother.    ROS:  Please see the history of present illness.   Otherwise, review of systems are positive for none.   All other systems are reviewed and negative.    PHYSICAL EXAM: VS:  BP 138/84 (BP Location: Left Arm, Patient Position: Sitting, Cuff Size: Normal)   Pulse 61   Ht '5\' 11"'$  (1.803 m)  Wt 231 lb 9.6 oz (105.1 kg)   SpO2 98%   BMI 32.30 kg/m  , BMI Body mass index is 32.3 kg/m. GEN: Well nourished, well developed, in no acute distress  HEENT: normal  Neck: no JVD, carotid bruits, or masses Cardiac: RRR; no murmurs, rubs, or gallops,no edema  Respiratory:  clear to auscultation bilaterally, normal work of breathing GI: soft, nontender, nondistended, + BS MS: no deformity or atrophy  Skin: warm and dry, no rash Neuro:  Strength and sensation are intact Psych: euthymic mood, full affect   EKG:  EKG is ordered today. The ekg ordered today demonstrates normal sinus rhythm with no significant ST or T wave changes.   Recent Labs: 09/25/2021: ALT 20 01/09/2022: BUN 11; Creatinine, Ser 0.97; Hemoglobin 11.9; Platelets 149; Potassium 3.9; Sodium 139    Lipid Panel    Component Value Date/Time   CHOL 122 06/04/2020 1549   TRIG 103 06/04/2020 1549   HDL 43 06/04/2020 1549   CHOLHDL 2.8 06/04/2020 1549   CHOLHDL 5.5 04/14/2016 0623   VLDL 31 04/14/2016 0623   LDLCALC 60  06/04/2020 1549   LDLDIRECT 61 06/04/2020 1549      Wt Readings from Last 3 Encounters:  06/17/22 231 lb 9.6 oz (105.1 kg)  05/08/22 232 lb (105.2 kg)  01/30/22 225 lb (102.1 kg)          No data to display            ASSESSMENT AND PLAN:  1. Coronary artery disease involving native coronary arteries without angina: He is doing well overall.  No recent chest pain or shortness of breath.  Recommend continuing medical therapy.  Dual antiplatelet therapy is optional at this time .  We will continue for now especially with his carotid endarterectomy done this year.  2.  Hyperlipidemia: Continue high-dose rosuvastatin with a target LDL of less than 70.  I reviewed his most recent labs done in September which showed an LDL of 44.  3.  Essential hypertension: Blood pressure is well controlled on current medications.  4.  Carotid artery disease status post recent left carotid endarterectomy.  Follow-up carotid Doppler showed mild nonobstructive disease bilaterally.  5.  Mediastinal lymphadenopathy maintained on most recent CT scan of the lungs in July.  We will plan a follow-up CT early next year.  6.  ascending thoracic aortic aneurysm: 4.1 cm . Follow up in July 2024.   Disposition:   FU with me in 6 months  Signed,  Kathlyn Sacramento, MD  06/17/2022 3:38 PM    [place]

## 2022-07-19 ENCOUNTER — Other Ambulatory Visit: Payer: Self-pay | Admitting: Physician Assistant

## 2022-07-19 DIAGNOSIS — I2510 Atherosclerotic heart disease of native coronary artery without angina pectoris: Secondary | ICD-10-CM

## 2022-07-30 DIAGNOSIS — R6889 Other general symptoms and signs: Secondary | ICD-10-CM | POA: Diagnosis not present

## 2022-07-30 DIAGNOSIS — R030 Elevated blood-pressure reading, without diagnosis of hypertension: Secondary | ICD-10-CM | POA: Diagnosis not present

## 2022-08-05 DIAGNOSIS — Z0000 Encounter for general adult medical examination without abnormal findings: Secondary | ICD-10-CM | POA: Diagnosis not present

## 2022-08-05 DIAGNOSIS — E7800 Pure hypercholesterolemia, unspecified: Secondary | ICD-10-CM | POA: Diagnosis not present

## 2022-08-05 DIAGNOSIS — Z1331 Encounter for screening for depression: Secondary | ICD-10-CM | POA: Diagnosis not present

## 2022-08-05 DIAGNOSIS — Z125 Encounter for screening for malignant neoplasm of prostate: Secondary | ICD-10-CM | POA: Diagnosis not present

## 2022-08-05 DIAGNOSIS — I2510 Atherosclerotic heart disease of native coronary artery without angina pectoris: Secondary | ICD-10-CM | POA: Diagnosis not present

## 2022-08-05 DIAGNOSIS — R519 Headache, unspecified: Secondary | ICD-10-CM | POA: Diagnosis not present

## 2022-08-05 DIAGNOSIS — I10 Essential (primary) hypertension: Secondary | ICD-10-CM | POA: Diagnosis not present

## 2022-08-12 DIAGNOSIS — F419 Anxiety disorder, unspecified: Secondary | ICD-10-CM | POA: Diagnosis not present

## 2022-08-20 DIAGNOSIS — R519 Headache, unspecified: Secondary | ICD-10-CM | POA: Diagnosis not present

## 2022-09-11 DIAGNOSIS — R799 Abnormal finding of blood chemistry, unspecified: Secondary | ICD-10-CM | POA: Diagnosis not present

## 2022-09-11 DIAGNOSIS — R9389 Abnormal findings on diagnostic imaging of other specified body structures: Secondary | ICD-10-CM | POA: Diagnosis not present

## 2022-09-12 DIAGNOSIS — R799 Abnormal finding of blood chemistry, unspecified: Secondary | ICD-10-CM | POA: Diagnosis not present

## 2022-09-12 DIAGNOSIS — R9389 Abnormal findings on diagnostic imaging of other specified body structures: Secondary | ICD-10-CM | POA: Diagnosis not present

## 2022-09-15 ENCOUNTER — Telehealth: Payer: Self-pay

## 2022-09-15 NOTE — Telephone Encounter (Signed)
Called patient before upcoming appointment with Dr. Tasia Catchings. I informed patient that I work with Dr. Tasia Catchings and asked him if he had any questions about his visit tomorrow. Patient asked what to expect at the visit. I explained to him that Dr. Tasia Catchings would see him and would more then likely obtain labs. Patient confirmed that his appointment time was at 3:30 and thanked me for calling.

## 2022-09-16 ENCOUNTER — Inpatient Hospital Stay: Payer: BC Managed Care – PPO | Attending: Oncology | Admitting: Oncology

## 2022-09-16 ENCOUNTER — Inpatient Hospital Stay: Payer: BC Managed Care – PPO

## 2022-09-16 ENCOUNTER — Encounter: Payer: Self-pay | Admitting: Oncology

## 2022-09-16 VITALS — BP 151/85 | HR 65 | Temp 96.8°F | Wt 234.2 lb

## 2022-09-16 DIAGNOSIS — I10 Essential (primary) hypertension: Secondary | ICD-10-CM | POA: Insufficient documentation

## 2022-09-16 DIAGNOSIS — Z79899 Other long term (current) drug therapy: Secondary | ICD-10-CM | POA: Diagnosis not present

## 2022-09-16 DIAGNOSIS — D649 Anemia, unspecified: Secondary | ICD-10-CM | POA: Insufficient documentation

## 2022-09-16 DIAGNOSIS — R519 Headache, unspecified: Secondary | ICD-10-CM | POA: Diagnosis not present

## 2022-09-16 LAB — CBC WITH DIFFERENTIAL/PLATELET
Abs Immature Granulocytes: 0.01 10*3/uL (ref 0.00–0.07)
Basophils Absolute: 0.1 10*3/uL (ref 0.0–0.1)
Basophils Relative: 1 %
Eosinophils Absolute: 0.2 10*3/uL (ref 0.0–0.5)
Eosinophils Relative: 3 %
HCT: 39.9 % (ref 39.0–52.0)
Hemoglobin: 13.5 g/dL (ref 13.0–17.0)
Immature Granulocytes: 0 %
Lymphocytes Relative: 41 %
Lymphs Abs: 2.7 10*3/uL (ref 0.7–4.0)
MCH: 29.7 pg (ref 26.0–34.0)
MCHC: 33.8 g/dL (ref 30.0–36.0)
MCV: 87.9 fL (ref 80.0–100.0)
Monocytes Absolute: 0.6 10*3/uL (ref 0.1–1.0)
Monocytes Relative: 8 %
Neutro Abs: 3 10*3/uL (ref 1.7–7.7)
Neutrophils Relative %: 47 %
Platelets: 175 10*3/uL (ref 150–400)
RBC: 4.54 MIL/uL (ref 4.22–5.81)
RDW: 13.2 % (ref 11.5–15.5)
WBC: 6.5 10*3/uL (ref 4.0–10.5)
nRBC: 0 % (ref 0.0–0.2)

## 2022-09-16 LAB — RETIC PANEL
Immature Retic Fract: 3.8 % (ref 2.3–15.9)
RBC.: 4.53 MIL/uL (ref 4.22–5.81)
Retic Count, Absolute: 52.1 10*3/uL (ref 19.0–186.0)
Retic Ct Pct: 1.2 % (ref 0.4–3.1)
Reticulocyte Hemoglobin: 32.4 pg (ref >27.9)

## 2022-09-16 LAB — TECHNOLOGIST SMEAR REVIEW: Plt Morphology: NORMAL

## 2022-09-16 LAB — IRON AND TIBC
Iron: 92 ug/dL (ref 45–182)
Saturation Ratios: 23 % (ref 17.9–39.5)
TIBC: 396 ug/dL (ref 250–450)
UIBC: 304 ug/dL

## 2022-09-16 LAB — FOLATE: Folate: 10.9 ng/mL (ref >5.9)

## 2022-09-16 LAB — LACTATE DEHYDROGENASE: LDH: 160 U/L (ref 98–192)

## 2022-09-16 LAB — FERRITIN: Ferritin: 52 ng/mL (ref 24–336)

## 2022-09-16 NOTE — Progress Notes (Signed)
Patient is referred by Dr. Kym Groom for abnormal platelets.

## 2022-09-16 NOTE — Assessment & Plan Note (Addendum)
Mild normocytic anemia. Check CBC, reticulocyte panel, LDH, myeloma panel, light chain ratio, hepatitis, HIV, iron TIBC ferritin, B12 and folate.   Discussed with patient about MRI brain findings.  Bright areas on diffusion-weighted imaging in the skull base and see 1, nonspecific.  Pending above workup.  Discussed about possible need of additional imaging characterizing.  He agrees with the plan.

## 2022-09-16 NOTE — Progress Notes (Signed)
Hematology/Oncology Consult Note [phone] [phone]     REFERRING PROVIDER: Valera Castle, *   Patient Care Team: Valera Castle, MD as PCP - General (Family Medicine) Wellington Hampshire, MD as PCP - Cardiology (Cardiology) Dionisio Porfirio, MD as Consulting Physician (Cardiology)  CHIEF COMPLAINTS/PURPOSE OF CONSULTATION:  Abnormal lab findings normocytic anemia.  HISTORY OF PRESENTING ILLNESS:  Carlos Diaz 62 y.o. male presents to establish care for normocytic anemia.  Patient has experienced headache and had MRI brain done at [facility] recently. 08/20/2022, MRI brain with and without contrast showed nonspecific MRI initially for headache.  Some bright areas on diffusion weighted imaging in the skull base and C1. Correlate with clinical findings of marrow abnormality  09/12/2022, patient's CBC showed mild anemia with hemoglobin 13.4.  ESR negative, CRP negative.  PSA negative.  Patient was referred to hematology for evaluation.  Patient denies any unintentional weight loss, night sweats, fever.   MEDICAL HISTORY:  Past Medical History:  Diagnosis Date   Anxiety    Arthritis    Carotid artery disease ([place])    a.) Carotid doppler 11/14/2021: 5-64% RICA; 33-29% LICA; < 51% CCA and ECA. c.) CTA neck 12/04/2021: 70-80% stenosis of prox LICA; mild plaquing in prox RICA without stenosis.   Coronary artery disease 04/14/2016   a.) LHC 04/14/2016: 95% RI, 50% oOM2-OM2, 50% p-mLAD --> PCI performed placing a 2.25 x 15 mm Xience Alpine DES to RI. b.) LHC 12/23/2018: 95% OM1, 40% pLAD, 40% mRCA --> PCI performed placing a 2.25 x 15 mm Resolute Onxy DES to oOM1. c.) LHC 06/25/2020: EF 55-65%; LVEDP norm; 40% p-mRCA, 20% pRCA, 50% p-mLAD, 30% OM2-2; RI and OM2-1 stents patent; intervention deferred opting for medical mgmt.   Depression    Diastolic dysfunction 88/41/6606   a.) TTE 04/14/2016: EF 65%; mild LA dil, G1DD. b.) stress echo 06/26/2016: EF >55%;  triv panvalvular regurg, max workload 13.70 METS. c.) TTE 11/23/2018: EF >%%; mild panvalvular regurg; fibrocalcified AoV without stenosis; G1DD.   GERD (gastroesophageal reflux disease)    Hyperlipidemia    Hypertension    Long term current use of antithrombotics/antiplatelets    a.) on daily DAPT therapy (ASA + clopidogrel)   Lyme disease    NSTEMI (non-ST elevated myocardial infarction) ([place]) 04/13/2016   a.) LHC 04/14/2016 --> 95% RI, 50% oOM2-OM2, 50% p-mLAD --> PCI performed placing a 2.25 x 15 mm Xience Alpine DES to the RI   Tinnitus    Unstable angina ([place])     SURGICAL HISTORY: Past Surgical History:  Procedure Laterality Date   CARDIAC CATHETERIZATION Right 04/14/2016   Procedure: Left Heart Cath and Coronary Angiography;  Surgeon: Dionisio Yukio, MD;  Location: [place] [facility];  Service: Cardiovascular;  Laterality: Right;   CARDIAC CATHETERIZATION N/A 04/14/2016   Procedure: Coronary Stent Intervention;  Surgeon: Yolonda Kida, MD;  Location: [place] [facility];  Service: Cardiovascular;  Laterality: N/A;   COLONOSCOPY     COLONOSCOPY WITH PROPOFOL N/A 03/26/2020   Procedure: COLONOSCOPY WITH PROPOFOL;  Surgeon: Lesly Rubenstein, MD;  Location: [facility] ENDOSCOPY;  Service: Endoscopy;  Laterality: N/A;   CORONARY STENT INTERVENTION N/A 12/23/2018   Procedure: CORONARY STENT INTERVENTION;  Surgeon: Isaias Cowman, MD;  Location: [place] [facility];  Service: Cardiovascular;  Laterality: N/A;   ENDARTERECTOMY Left 01/08/2022   Procedure: ENDARTERECTOMY CAROTID;  Surgeon: Katha Cabal, MD;  Location: [facility] ORS;  Service: Vascular;  Laterality: Left;   LEFT HEART CATH  AND CORONARY ANGIOGRAPHY Left 12/23/2018   Procedure: LEFT HEART CATH AND CORONARY ANGIOGRAPHY;  Surgeon: Dionisio Bow, MD;  Location: [place] [facility];  Service: Cardiovascular;  Laterality: Left;   LEFT HEART CATH AND CORONARY ANGIOGRAPHY Left 06/25/2020   Procedure: LEFT HEART CATH  AND CORONARY ANGIOGRAPHY;  Surgeon: Wellington Hampshire, MD;  Location: [place] [facility];  Service: Cardiovascular;  Laterality: Left;   PORT-A-CATH REMOVAL     PORTACATH PLACEMENT      SOCIAL HISTORY: Social History   Socioeconomic History   Marital status: Married    Spouse name: Not on file   Number of children: Not on file   Years of education: Not on file   Highest education level: Not on file  Occupational History   Not on file  Tobacco Use   Smoking status: Never   Smokeless tobacco: Never  Vaping Use   Vaping Use: Never used  Substance and Sexual Activity   Alcohol use: Yes    Comment: occ beer   Drug use: No   Sexual activity: Not on file  Other Topics Concern   Not on file  Social History Narrative   Lives at home with wife and daughter.   Social Determinants of Health   Financial Resource Strain: Not on file  Food Insecurity: Not on file  Transportation Needs: Not on file  Physical Activity: Not on file  Stress: Not on file  Social Connections: Not on file  Intimate Partner Violence: Not on file    FAMILY HISTORY: Family History  Problem Relation Age of Onset   Cancer Mother        lung cancer with brain mets   CAD Mother    Cancer Maternal Aunt        brain cancer    ALLERGIES:  has No Known Allergies.  MEDICATIONS:  Current Outpatient Medications  Medication Sig Dispense Refill   aspirin EC 81 MG tablet Take 1 tablet (81 mg total) by mouth daily. Swallow whole. (Patient taking differently: Take 81 mg by mouth every morning. Swallow whole.) 90 tablet 3   buPROPion (WELLBUTRIN XL) 300 MG 24 hr tablet Take 300 mg by mouth at bedtime.     citalopram (CELEXA) 40 MG tablet Take 40 mg by mouth at bedtime.     clopidogrel (PLAVIX) 75 MG tablet Take 75 mg by mouth daily.     isosorbide mononitrate (IMDUR) 60 MG 24 hr tablet TAKE 1 TABLET BY MOUTH EVERY DAY 90 tablet 1   metoprolol succinate (TOPROL-XL) 25 MG 24 hr tablet TAKE 1 TABLET (25 MG TOTAL)  BY MOUTH DAILY. 90 tablet 1   pantoprazole (PROTONIX) 40 MG tablet Take 1 tablet (40 mg total) by mouth daily. 30 tablet 0   rosuvastatin (CRESTOR) 40 MG tablet TAKE 1 TABLET BY MOUTH EVERY DAY 90 tablet 2   nitroGLYCERIN (NITROSTAT) 0.4 MG SL tablet PLACE 1 TABLET UNDER THE TONGUE EVERY 5 MINUTES AS NEEDED. (Patient not taking: Reported on 06/17/2022) 25 tablet 3   oxyCODONE (OXY IR/ROXICODONE) 5 MG immediate release tablet Take 1-2 tablets (5-10 mg total) by mouth every 4 (four) hours as needed for moderate pain. (Patient not taking: Reported on 01/30/2022) 30 tablet 0   No current facility-administered medications for this visit.    Review of Systems  Constitutional:  Negative for appetite change, chills, fatigue, fever and unexpected weight change.  HENT:   Negative for hearing loss and voice change.   Eyes:  Negative for  eye problems and icterus.  Respiratory:  Negative for chest tightness, cough and shortness of breath.   Cardiovascular:  Negative for chest pain and leg swelling.  Gastrointestinal:  Negative for abdominal distention and abdominal pain.  Endocrine: Negative for hot flashes.  Genitourinary:  Negative for difficulty urinating, dysuria and frequency.   Musculoskeletal:  Negative for arthralgias.  Skin:  Negative for itching and rash.  Neurological:  Positive for headaches. Negative for light-headedness and numbness.  Hematological:  Negative for adenopathy. Does not bruise/bleed easily.  Psychiatric/Behavioral:  Negative for confusion.      PHYSICAL EXAMINATION: ECOG PERFORMANCE STATUS: 0 - Asymptomatic  Vitals:   09/16/22 1527  BP: (!) 151/85  Pulse: 65  Temp: (!) 96.8 F (36 C)  SpO2: 99%   Filed Weights   09/16/22 1527  Weight: 234 lb 3.2 oz (106.2 kg)    Physical Exam Constitutional:      General: He is not in acute distress.    Appearance: He is not diaphoretic.  HENT:     Head: Normocephalic and atraumatic.     Nose: Nose normal.      Mouth/Throat:     Pharynx: No oropharyngeal exudate.  Eyes:     General: No scleral icterus.    Pupils: Pupils are equal, round, and reactive to light.  Cardiovascular:     Rate and Rhythm: Normal rate and regular rhythm.     Heart sounds: No murmur heard. Pulmonary:     Effort: Pulmonary effort is normal. No respiratory distress.     Breath sounds: No rales.  Chest:     Chest wall: No tenderness.  Abdominal:     General: There is no distension.     Palpations: Abdomen is soft.     Tenderness: There is no abdominal tenderness.  Musculoskeletal:        General: Normal range of motion.     Cervical back: Normal range of motion and neck supple.  Skin:    General: Skin is warm and dry.     Findings: No erythema.  Neurological:     Mental Status: He is alert and oriented to person, place, and time.     Cranial Nerves: No cranial nerve deficit.     Motor: No abnormal muscle tone.     Coordination: Coordination normal.  Psychiatric:        Mood and Affect: Affect normal.      LABORATORY DATA:  I have reviewed the data as listed    Latest Ref Rng & Units 09/16/2022    4:09 PM 01/09/2022    4:30 AM 01/06/2022    3:00 PM  CBC  WBC 4.0 - 10.5 K/uL 6.5  9.0  8.1   Hemoglobin 13.0 - 17.0 g/dL 13.5  11.9  12.5   Hematocrit 39.0 - 52.0 % 39.9  36.5  37.2   Platelets 150 - 400 K/uL 175  149  168       Latest Ref Rng & Units 01/09/2022    4:30 AM 01/06/2022    3:00 PM 12/04/2021    3:20 PM  CMP  Glucose 70 - 99 mg/dL 113  89    BUN 8 - 23 mg/dL 11  22    Creatinine 0.61 - 1.24 mg/dL 0.97  1.08  1.30   Sodium 135 - 145 mmol/L 139  139    Potassium 3.5 - 5.1 mmol/L 3.9  3.7    Chloride 98 - 111 mmol/L 109  106  CO2 22 - 32 mmol/L 26  24    Calcium 8.9 - 10.3 mg/dL 8.1  9.3       RADIOGRAPHIC STUDIES: I have personally reviewed the radiological images as listed and agreed with the findings in the report. No results found.  ASSESSMENT & PLAN:  Normocytic anemia Mild  normocytic anemia. Check CBC, reticulocyte panel, LDH, myeloma panel, light chain ratio, hepatitis, HIV, iron TIBC ferritin, B12 and folate.   Discussed with patient about MRI brain findings.  Bright areas on diffusion-weighted imaging in the skull base and see 1, nonspecific.  Pending above workup.  Discussed about possible need of additional imaging characterizing.  He agrees with the plan.   Orders Placed This Encounter  Procedures   CBC with Differential/Platelet    Standing Status:   Future    Number of Occurrences:   1    Standing Expiration Date:   09/17/2023   Retic Panel    Standing Status:   Future    Number of Occurrences:   1    Standing Expiration Date:   09/17/2023   Multiple Myeloma Panel (SPEP&IFE w/QIG)    Standing Status:   Future    Number of Occurrences:   1    Standing Expiration Date:   09/17/2023   Kappa/lambda light chains    Standing Status:   Future    Number of Occurrences:   1    Standing Expiration Date:   09/17/2023   Lactate dehydrogenase    Standing Status:   Future    Number of Occurrences:   1    Standing Expiration Date:   09/17/2023   Hepatitis panel, acute    Standing Status:   Future    Number of Occurrences:   1    Standing Expiration Date:   09/17/2023   HIV Antibody (routine testing w rflx)    Standing Status:   Future    Number of Occurrences:   1    Standing Expiration Date:   09/17/2023   Folate    Standing Status:   Future    Number of Occurrences:   1    Standing Expiration Date:   09/17/2023   Vitamin B12    Standing Status:   Future    Number of Occurrences:   1    Standing Expiration Date:   09/17/2023   Ferritin    Standing Status:   Future    Number of Occurrences:   1    Standing Expiration Date:   03/17/2023   Iron and TIBC    Standing Status:   Future    Number of Occurrences:   1    Standing Expiration Date:   09/17/2023   Technologist smear review    Standing Status:   Future    Number of Occurrences:   1    Standing  Expiration Date:   09/17/2023    Order Specific Question:   Clinical information:    Answer:   anemia   Follow-up to be determined.  Pending above workup.   All questions were answered. The patient knows to call the clinic with any problems, questions or concerns. No barriers to learning was detected.  Earlie Server, MD 09/16/2022

## 2022-09-17 LAB — HEPATITIS PANEL, ACUTE
HCV Ab: NONREACTIVE
Hep A IgM: NONREACTIVE
Hep B C IgM: NONREACTIVE
Hepatitis B Surface Ag: NONREACTIVE

## 2022-09-17 LAB — KAPPA/LAMBDA LIGHT CHAINS
Kappa free light chain: 22.3 mg/L — ABNORMAL HIGH (ref 3.3–19.4)
Kappa, lambda light chain ratio: 1.48 (ref 0.26–1.65)
Lambda free light chains: 15.1 mg/L (ref 5.7–26.3)

## 2022-09-17 LAB — HIV ANTIBODY (ROUTINE TESTING W REFLEX): HIV Screen 4th Generation wRfx: NONREACTIVE

## 2022-09-17 LAB — VITAMIN B12: Vitamin B-12: 232 pg/mL (ref 180–914)

## 2022-09-22 LAB — MULTIPLE MYELOMA PANEL, SERUM
Albumin SerPl Elph-Mcnc: 4.1 g/dL (ref 2.9–4.4)
Albumin/Glob SerPl: 1.5 (ref 0.7–1.7)
Alpha 1: 0.2 g/dL (ref 0.0–0.4)
Alpha2 Glob SerPl Elph-Mcnc: 0.6 g/dL (ref 0.4–1.0)
B-Globulin SerPl Elph-Mcnc: 0.9 g/dL (ref 0.7–1.3)
Gamma Glob SerPl Elph-Mcnc: 1.1 g/dL (ref 0.4–1.8)
Globulin, Total: 2.8 g/dL (ref 2.2–3.9)
IgA: 327 mg/dL (ref 61–437)
IgG (Immunoglobin G), Serum: 1151 mg/dL (ref 603–1613)
IgM (Immunoglobulin M), Srm: 77 mg/dL (ref 20–172)
Total Protein ELP: 6.9 g/dL (ref 6.0–8.5)

## 2022-09-23 ENCOUNTER — Other Ambulatory Visit: Payer: Self-pay

## 2022-09-23 ENCOUNTER — Telehealth: Payer: Self-pay

## 2022-09-23 ENCOUNTER — Ambulatory Visit
Admission: RE | Admit: 2022-09-23 | Discharge: 2022-09-23 | Disposition: A | Discharge status: Home / Self Care | Payer: Self-pay | Source: Ambulatory Visit | Attending: Oncology | Admitting: Oncology

## 2022-09-23 DIAGNOSIS — D649 Anemia, unspecified: Secondary | ICD-10-CM

## 2022-09-23 NOTE — Telephone Encounter (Signed)
-----  Message from Earlie Server, MD sent at 09/22/2022 10:18 PM EST ----- Please let him know that all his blood work result are good.  Please obtain his MRI images from [place] and once we have images, will discuss with our radiologist. Thanks.

## 2022-09-23 NOTE — Telephone Encounter (Signed)
Pt informed of lab work results.

## 2022-09-23 NOTE — Telephone Encounter (Signed)
MRI images have been requested through [facility]   MRI brain w wo -08/20/22

## 2022-09-24 NOTE — Telephone Encounter (Signed)
MRI brain images available in PACS.

## 2022-09-25 ENCOUNTER — Other Ambulatory Visit: Payer: Self-pay | Admitting: Oncology

## 2022-10-02 ENCOUNTER — Other Ambulatory Visit: Payer: BC Managed Care – PPO

## 2022-10-03 ENCOUNTER — Telehealth: Payer: Self-pay

## 2022-10-03 DIAGNOSIS — R9389 Abnormal findings on diagnostic imaging of other specified body structures: Secondary | ICD-10-CM

## 2022-10-03 NOTE — Telephone Encounter (Signed)
-----   Message from Earlie Server, MD sent at 10/03/2022 10:33 AM EST ----- I just called him and recommend MRI brain w wo in 3 months, prior to MD visit. He agrees with the plan.  Use abnormal MRI as the reason of repeat MRI.  Thanks.   zy

## 2022-10-03 NOTE — Telephone Encounter (Signed)
Please schedule and inform pt of appt  MRI in 3 months  MD a few days after MRI.

## 2022-10-20 DIAGNOSIS — F4323 Adjustment disorder with mixed anxiety and depressed mood: Secondary | ICD-10-CM | POA: Diagnosis not present

## 2022-11-04 NOTE — Progress Notes (Signed)
MRN : [id number]  Carlos Diaz is a 62 y.o. (06-Jun-1961) male who presents with chief complaint of check circulation.  History of Present Illness:   The patient is seen for follow up evaluation of carotid stenosis status post left carotid endarterectomy on 01/08/2022.  There were no post operative problems or complications related to the surgery.  The patient denies neck or incisional pain.   The patient denies interval amaurosis fugax. There is no recent history of TIA symptoms or focal motor deficits. There is no prior documented CVA.   The patient denies headache.   The patient is taking enteric-coated aspirin 81 mg daily.   No recent shortening of the patient's walking distance or new symptoms consistent with claudication.  No history of rest pain symptoms. No new ulcers or wounds of the lower extremities have occurred.   There is no history of DVT, PE or superficial thrombophlebitis. No recent episodes of angina or shortness of breath documented.     Duplex ultrasound of the bilateral carotid arteries shows 1-39% bilateral carotid artery stenosis.  No significant change compared to previous study.   No outpatient medications have been marked as taking for the 11/06/22 encounter (Appointment) with Delana Meyer, Dolores Lory, MD.    Past Medical History:  Diagnosis Date   Anxiety    Arthritis    Carotid artery disease ([place])    a.) Carotid doppler 11/14/2021: [id number] RICA; [id number] LICA; < [id number] CCA and ECA. c.) CTA neck 12/04/2021: 70-80% stenosis of prox LICA; mild plaquing in prox RICA without stenosis.   Coronary artery disease 04/14/2016   a.) LHC 04/14/2016: 95% RI, 50% oOM2-OM2, 50% p-mLAD --> PCI performed placing a 2.25 x 15 mm Xience Alpine DES to RI. b.) LHC 12/23/2018: 95% OM1, 40% pLAD, 40% mRCA --> PCI performed placing a 2.25 x 15 mm Resolute Onxy DES to oOM1. c.) LHC 06/25/2020: EF 55-65%; LVEDP norm; 40% p-mRCA, 20% pRCA, 50% p-mLAD, 30% OM2-2; RI and OM2-1  stents patent; intervention deferred opting for medical mgmt.   Depression    Diastolic dysfunction [id number]   a.) TTE 04/14/2016: EF 65%; mild LA dil, G1DD. b.) stress echo 06/26/2016: EF >55%; triv panvalvular regurg, max workload 13.70 METS. c.) TTE 11/23/2018: EF >%%; mild panvalvular regurg; fibrocalcified AoV without stenosis; G1DD.   GERD (gastroesophageal reflux disease)    Hyperlipidemia    Hypertension    Long term current use of antithrombotics/antiplatelets    a.) on daily DAPT therapy (ASA + clopidogrel)   Lyme disease    NSTEMI (non-ST elevated myocardial infarction) ([place]) 04/13/2016   a.) LHC 04/14/2016 --> 95% RI, 50% oOM2-OM2, 50% p-mLAD --> PCI performed placing a 2.25 x 15 mm Xience Alpine DES to the RI   Tinnitus    Unstable angina [facility])     Past Surgical History:  Procedure Laterality Date   CARDIAC CATHETERIZATION Right 04/14/2016   Procedure: Left Heart Cath and Coronary Angiography;  Surgeon: Dionisio Assad, MD;  Location: [place] [facility];  Service: Cardiovascular;  Laterality: Right;   CARDIAC CATHETERIZATION N/A 04/14/2016   Procedure: Coronary Stent Intervention;  Surgeon: Yolonda Kida, MD;  Location: [place] [facility];  Service: Cardiovascular;  Laterality: N/A;   COLONOSCOPY     COLONOSCOPY WITH PROPOFOL N/A 03/26/2020   Procedure: COLONOSCOPY WITH PROPOFOL;  Surgeon: Lesly Rubenstein, MD;  Location: [facility];  Service: Endoscopy;  Laterality: N/A;  CORONARY STENT INTERVENTION N/A 12/23/2018   Procedure: CORONARY STENT INTERVENTION;  Surgeon: Isaias Cowman, MD;  Location: [place] [facility];  Service: Cardiovascular;  Laterality: N/A;   ENDARTERECTOMY Left 01/08/2022   Procedure: ENDARTERECTOMY CAROTID;  Surgeon: Katha Cabal, MD;  Location: [facility];  Service: Vascular;  Laterality: Left;   LEFT HEART CATH AND CORONARY ANGIOGRAPHY Left 12/23/2018   Procedure: LEFT HEART CATH AND CORONARY ANGIOGRAPHY;  Surgeon: Dionisio Rushi, MD;  Location: [place] [facility];  Service: Cardiovascular;  Laterality: Left;   LEFT HEART CATH AND CORONARY ANGIOGRAPHY Left 06/25/2020   Procedure: LEFT HEART CATH AND CORONARY ANGIOGRAPHY;  Surgeon: Wellington Hampshire, MD;  Location: [place] [facility];  Service: Cardiovascular;  Laterality: Left;   PORT-A-CATH REMOVAL     PORTACATH PLACEMENT      Social History Social History   Tobacco Use   Smoking status: Never   Smokeless tobacco: Never  Vaping Use   Vaping Use: Never used  Substance Use Topics   Alcohol use: Yes    Comment: occ beer   Drug use: No    Family History Family History  Problem Relation Age of Onset   Cancer Mother        lung cancer with brain mets   CAD Mother    Cancer Maternal Aunt        brain cancer    No Known Allergies   REVIEW OF SYSTEMS (Negative unless checked)  Constitutional: '[]'$ Weight loss  '[]'$ Fever  '[]'$ Chills Cardiac: '[]'$ Chest pain   '[]'$ Chest pressure   '[]'$ Palpitations   '[]'$ Shortness of breath when laying flat   '[]'$ Shortness of breath with exertion. Vascular:  '[x]'$ Pain in legs with walking   '[]'$ Pain in legs at rest  '[]'$ History of DVT   '[]'$ Phlebitis   '[]'$ Swelling in legs   '[]'$ Varicose veins   '[]'$ Non-healing ulcers Pulmonary:   '[]'$ Uses home oxygen   '[]'$ Productive cough   '[]'$ Hemoptysis   '[]'$ Wheeze  '[]'$ COPD   '[]'$ Asthma Neurologic:  '[]'$ Dizziness   '[]'$ Seizures   '[]'$ History of stroke   '[]'$ History of TIA  '[]'$ Aphasia   '[]'$ Vissual changes   '[]'$ Weakness or numbness in arm   '[]'$ Weakness or numbness in leg Musculoskeletal:   '[]'$ Joint swelling   '[]'$ Joint pain   '[]'$ Low back pain Hematologic:  '[]'$ Easy bruising  '[]'$ Easy bleeding   '[]'$ Hypercoagulable state   '[]'$ Anemic Gastrointestinal:  '[]'$ Diarrhea   '[]'$ Vomiting  '[x]'$ Gastroesophageal reflux/heartburn   '[]'$ Difficulty swallowing. Genitourinary:  '[]'$ Chronic kidney disease   '[]'$ Difficult urination  '[]'$ Frequent urination   '[]'$ Blood in urine Skin:  '[]'$ Rashes   '[]'$ Ulcers  Psychological:  '[x]'$ History of anxiety   '[]'$  History of major  depression.  Physical Examination  There were no vitals filed for this visit. There is no height or weight on file to calculate BMI. Gen: WD/WN, NAD Head: /AT, No temporalis wasting.  Ear/Nose/Throat: Hearing grossly intact, nares w/o erythema or drainage Eyes: PER, EOMI, sclera nonicteric.  Neck: Supple, no masses.  No bruit or JVD.  Pulmonary:  Good air movement, no audible wheezing, no use of accessory muscles.  Cardiac: RRR, normal S1, S2, no Murmurs. Vascular:  mild trophic changes, no open wounds Vessel Right Left  Radial Palpable Palpable  Carotid Palpable Palpable  Gastrointestinal: soft, non-distended. No guarding/no peritoneal signs.  Musculoskeletal: M/S 5/5 throughout.  No visible deformity.  Neurologic: CN 2-12 intact. Pain and light touch intact in extremities.  Symmetrical.  Speech is fluent. Motor exam as listed above. Psychiatric: Judgment intact, Mood & affect appropriate for  pt's clinical situation. Dermatologic: No rashes or ulcers noted.  No changes consistent with cellulitis.   CBC Lab Results  Component Value Date   WBC 6.5 09/16/2022   HGB 13.5 09/16/2022   HCT 39.9 09/16/2022   MCV 87.9 09/16/2022   PLT 175 09/16/2022    BMET    Component Value Date/Time   NA 139 01/09/2022 0430   NA 141 07/19/2020 1209   K 3.9 01/09/2022 0430   CL 109 01/09/2022 0430   CO2 26 01/09/2022 0430   GLUCOSE 113 (H) 01/09/2022 0430   BUN 11 01/09/2022 0430   BUN 15 07/19/2020 1209   CREATININE 0.97 01/09/2022 0430   CALCIUM 8.1 (L) 01/09/2022 0430   GFRNONAA >60 01/09/2022 0430   GFRAA 89 07/19/2020 1209   CrCl cannot be calculated (Patient's most recent lab result is older than the maximum 21 days allowed.).  COAG Lab Results  Component Value Date   INR 1.06 04/14/2016    Radiology No results found.   Assessment/Plan 1. Stenosis of left carotid artery Recommend:  Given the patient's asymptomatic subcritical stenosis no further invasive testing or  surgery at this time.  Duplex ultrasound shows 1-39% stenosis bilaterally.  He is status post successful carotid endarterectomy on the left.  Continue antiplatelet therapy as prescribed Continue management of CAD, HTN and Hyperlipidemia Healthy heart diet,  encouraged exercise at least 4 times per week Follow up in 12 months with duplex ultrasound and physical exam  - VAS US CAROTID; Future  2. Primary hypertension Continue antihypertensive medications as already ordered, these medications have been reviewed and there are no changes at this time.  3. Coronary artery disease of native artery of native heart with stable angina pectoris (HCC) Continue cardiac and antihypertensive medications as already ordered and reviewed, no changes at this time.  Continue statin as ordered and reviewed, no changes at this time  Nitrates PRN for chest pain  4. Mixed hyperlipidemia Continue statin as ordered and reviewed, no changes at this time    [name], MD  11/04/2022 4:41 PM

## 2022-11-06 ENCOUNTER — Encounter (INDEPENDENT_AMBULATORY_CARE_PROVIDER_SITE_OTHER): Payer: Self-pay | Admitting: Vascular Surgery

## 2022-11-06 ENCOUNTER — Ambulatory Visit (INDEPENDENT_AMBULATORY_CARE_PROVIDER_SITE_OTHER): Payer: BC Managed Care – PPO

## 2022-11-06 ENCOUNTER — Ambulatory Visit (INDEPENDENT_AMBULATORY_CARE_PROVIDER_SITE_OTHER): Payer: BC Managed Care – PPO | Admitting: Vascular Surgery

## 2022-11-06 VITALS — BP 118/77 | HR 58 | Resp 16 | Wt 236.8 lb

## 2022-11-06 DIAGNOSIS — I25118 Atherosclerotic heart disease of native coronary artery with other forms of angina pectoris: Secondary | ICD-10-CM | POA: Diagnosis not present

## 2022-11-06 DIAGNOSIS — E782 Mixed hyperlipidemia: Secondary | ICD-10-CM | POA: Diagnosis not present

## 2022-11-06 DIAGNOSIS — I10 Essential (primary) hypertension: Secondary | ICD-10-CM

## 2022-11-06 DIAGNOSIS — I6522 Occlusion and stenosis of left carotid artery: Secondary | ICD-10-CM

## 2022-11-06 DIAGNOSIS — I6523 Occlusion and stenosis of bilateral carotid arteries: Secondary | ICD-10-CM | POA: Diagnosis not present

## 2022-11-07 ENCOUNTER — Telehealth (INDEPENDENT_AMBULATORY_CARE_PROVIDER_SITE_OTHER): Payer: Self-pay | Admitting: Nurse Practitioner

## 2022-11-07 NOTE — Telephone Encounter (Signed)
Patient came into the office today requesting a letter to be provided to the airlines in hopes to get his money back from a cancelled trip last year. The patient had a trip to [place] planned but according to patient, "they told me that I couldn't fly". He is requesting a letter on letterhead stationary with name of practice and provider, address along with phone number, stating that he was told not to fly due to his "upcoming major surgery". Please advise patient with any questions or when letter is ready.

## 2022-11-08 ENCOUNTER — Encounter (INDEPENDENT_AMBULATORY_CARE_PROVIDER_SITE_OTHER): Payer: Self-pay | Admitting: Vascular Surgery

## 2022-11-09 NOTE — Telephone Encounter (Signed)
Based on our records, we first evaluated the patient on 11/21/2021 for his carotid artery stenosis.  He ultimately underwent surgery on 01/08/2022 and was released to go back to work without restrictions on 02/24/2022.  Based on these records we will be able to write that he was unable to fly from 11/21/2021 to 02/24/2022.  If these dates will work for the patient let us know and we will print that letter for him.  However, if the dates are outside of that we will be unable to assist him

## 2022-11-18 ENCOUNTER — Telehealth (INDEPENDENT_AMBULATORY_CARE_PROVIDER_SITE_OTHER): Payer: Self-pay

## 2022-11-18 NOTE — Telephone Encounter (Signed)
Patient called stating he needs a note with a letterhead with a date address and signature from whomever did his surgery saying when he had his surgery and that he couldn't fly and so he can get remembered by the airline to get his money back. He said his willing to have and appt. To get this done if he need to.  Please advise.

## 2022-11-18 NOTE — Telephone Encounter (Signed)
Based on our records, we first evaluated the patient on 11/21/2021 for his carotid artery stenosis.  He ultimately underwent surgery on 01/08/2022 and was released to go back to work without restrictions on 02/24/2022.  Based on these records we will be able to write that he was unable to fly from 11/21/2021 to 02/24/2022.  If these dates will work for the patient let us know and we will print that letter for him.  However, if the dates are outside of that we will be unable to assist him.  Please contact the patient to let me know what he says and I will have a letter done for him.  He will not need an office visit for this.

## 2022-11-19 ENCOUNTER — Encounter (INDEPENDENT_AMBULATORY_CARE_PROVIDER_SITE_OTHER): Payer: Self-pay | Admitting: Nurse Practitioner

## 2022-11-19 NOTE — Telephone Encounter (Signed)
No we cannot, if he had my chart we can send it that way

## 2022-11-19 NOTE — Telephone Encounter (Signed)
Patient is walking into the building now. I will talk to him when he comes in.

## 2022-11-19 NOTE — Telephone Encounter (Signed)
Patient dropped off detail paper pertaining to his request from the airline and the letter he is requesting.

## 2022-11-19 NOTE — Telephone Encounter (Signed)
Are you able to email a copy as well?

## 2022-11-19 NOTE — Telephone Encounter (Signed)
Letter left on your desk

## 2022-11-20 NOTE — Telephone Encounter (Signed)
Letter was taken to the front for pick up yesterday

## 2022-12-19 ENCOUNTER — Encounter: Payer: Self-pay | Admitting: Cardiovascular Disease

## 2022-12-19 ENCOUNTER — Ambulatory Visit: Payer: BC Managed Care – PPO | Attending: Cardiovascular Disease | Admitting: Cardiovascular Disease

## 2022-12-19 VITALS — BP 138/80 | HR 57 | Ht 72.0 in | Wt 237.5 lb

## 2022-12-19 DIAGNOSIS — I7121 Aneurysm of the ascending aorta, without rupture: Secondary | ICD-10-CM

## 2022-12-19 DIAGNOSIS — E785 Hyperlipidemia, unspecified: Secondary | ICD-10-CM

## 2022-12-19 DIAGNOSIS — R0602 Shortness of breath: Secondary | ICD-10-CM

## 2022-12-19 DIAGNOSIS — I2510 Atherosclerotic heart disease of native coronary artery without angina pectoris: Secondary | ICD-10-CM | POA: Diagnosis not present

## 2022-12-19 NOTE — Progress Notes (Signed)
Cardiology Office Note   Date:  12/19/2022   ID:  Carlos Diaz, DOB July 01, 1961, MRN [id number]  PCP:  Dione Housekeeper, MD  Cardiologist:   Lorine Bears, MD   Chief Complaint  Patient presents with   6 month follow up     "Doing well." Medications reviewed by the patient verbally.       History of Present Illness: Carlos Diaz is a 62 y.o. male who presents for a follow-up visit regarding coronary artery disease. He has known history of coronary artery disease and presented in 2017 with non-ST elevation myocardial infarction. He was found to have high-grade stenosis in the ramus intermedius which was treated with PCI and drug-eluting stent placement.  He had recurrent anginal symptoms in April 2020.  Repeat cardiac catheterization showed patent ramus stent.  There was high-grade stenosis in proximal OM branch which was treated successfully with PCI and drug-eluting stent placement. There was also moderate proximal to mid LAD disease and mild RCA disease.  Other medical problems include essential hypertension and hyperlipidemia.  He is not a smoker and has no family history of coronary artery disease.  He works at [employer].  Repeat cardiac catheterization was performed in October 2021 due to atypical chest pain and abnormal nuclear stress test. It showed patent stents in the ramus and OM 2 with no significant restenosis.  There was stable moderate proximal LAD and mid RCA disease.  Ejection fraction and LVEDP were both normal.  He had another Lexiscan Myoview in March of 2023 that showed no evidence of ischemia with normal ejection fraction.  He underwent left carotid endarterectomy for asymptomatic high-grade stenosis in May 2023.  He has been doing well with no chest pain, shortness of breath or palpitations.    Past Medical History:  Diagnosis Date   Anxiety    Arthritis    Carotid artery disease    a.) Carotid doppler 11/14/2021: 1-39% RICA; 60-79% LICA; < 50%  CCA and ECA. c.) CTA neck 12/04/2021: 70-80% stenosis of prox LICA; mild plaquing in prox RICA without stenosis.   Coronary artery disease 04/14/2016   a.) LHC 04/14/2016: 95% RI, 50% oOM2-OM2, 50% p-mLAD --> PCI performed placing a 2.25 x 15 mm Xience Alpine DES to RI. b.) LHC 12/23/2018: 95% OM1, 40% pLAD, 40% mRCA --> PCI performed placing a 2.25 x 15 mm Resolute Onxy DES to oOM1. c.) LHC 06/25/2020: EF 55-65%; LVEDP norm; 40% p-mRCA, 20% pRCA, 50% p-mLAD, 30% OM2-2; RI and OM2-1 stents patent; intervention deferred opting for medical mgmt.   Depression    Diastolic dysfunction 04/14/2016   a.) TTE 04/14/2016: EF 65%; mild LA dil, G1DD. b.) stress echo 06/26/2016: EF >55%; triv panvalvular regurg, max workload 13.70 METS. c.) TTE 11/23/2018: EF >%%; mild panvalvular regurg; fibrocalcified AoV without stenosis; G1DD.   GERD (gastroesophageal reflux disease)    Hyperlipidemia    Hypertension    Long term current use of antithrombotics/antiplatelets    a.) on daily DAPT therapy (ASA + clopidogrel)   Lyme disease    NSTEMI (non-ST elevated myocardial infarction) 04/13/2016   a.) LHC 04/14/2016 --> 95% RI, 50% oOM2-OM2, 50% p-mLAD --> PCI performed placing a 2.25 x 15 mm Xience Alpine DES to the RI   Tinnitus    Unstable angina     Past Surgical History:  Procedure Laterality Date   CARDIAC CATHETERIZATION Right 04/14/2016   Procedure: Left Heart Cath and Coronary Angiography;  Surgeon: Laurier Nancy, MD;  Location:  [facility];  Service: Cardiovascular;  Laterality: Right;   CARDIAC CATHETERIZATION N/A 04/14/2016   Procedure: Coronary Stent Intervention;  Surgeon: Alwyn Pea, MD;  Location: [facility];  Service: Cardiovascular;  Laterality: N/A;   COLONOSCOPY     COLONOSCOPY WITH PROPOFOL N/A 03/26/2020   Procedure: COLONOSCOPY WITH PROPOFOL;  Surgeon: Regis Bill, MD;  Location: [facility];  Service: Endoscopy;  Laterality: N/A;   CORONARY STENT  INTERVENTION N/A 12/23/2018   Procedure: CORONARY STENT INTERVENTION;  Surgeon: Marcina Millard, MD;  Location: [facility];  Service: Cardiovascular;  Laterality: N/A;   ENDARTERECTOMY Left 01/08/2022   Procedure: ENDARTERECTOMY CAROTID;  Surgeon: Renford Dills, MD;  Location: [facility];  Service: Vascular;  Laterality: Left;   LEFT HEART CATH AND CORONARY ANGIOGRAPHY Left 12/23/2018   Procedure: LEFT HEART CATH AND CORONARY ANGIOGRAPHY;  Surgeon: Laurier Nancy, MD;  Location: [facility];  Service: Cardiovascular;  Laterality: Left;   LEFT HEART CATH AND CORONARY ANGIOGRAPHY Left 06/25/2020   Procedure: LEFT HEART CATH AND CORONARY ANGIOGRAPHY;  Surgeon: Iran Ouch, MD;  Location: [facility];  Service: Cardiovascular;  Laterality: Left;   PORT-A-CATH REMOVAL     PORTACATH PLACEMENT       Current Outpatient Medications  Medication Sig Dispense Refill   aspirin EC 81 MG tablet Take 1 tablet (81 mg total) by mouth daily. Swallow whole. (Patient taking differently: Take 81 mg by mouth every morning. Swallow whole.) 90 tablet 3   citalopram (CELEXA) 40 MG tablet Take 40 mg by mouth at bedtime.     clopidogrel (PLAVIX) 75 MG tablet Take 75 mg by mouth daily.     isosorbide mononitrate (IMDUR) 60 MG 24 hr tablet TAKE 1 TABLET BY MOUTH EVERY DAY 90 tablet 1   metoprolol succinate (TOPROL-XL) 25 MG 24 hr tablet TAKE 1 TABLET (25 MG TOTAL) BY MOUTH DAILY. 90 tablet 1   nitroGLYCERIN (NITROSTAT) 0.4 MG SL tablet PLACE 1 TABLET UNDER THE TONGUE EVERY 5 MINUTES AS NEEDED. 25 tablet 3   pantoprazole (PROTONIX) 40 MG tablet Take 1 tablet (40 mg total) by mouth daily. 30 tablet 0   rosuvastatin (CRESTOR) 40 MG tablet TAKE 1 TABLET BY MOUTH EVERY DAY 90 tablet 2   buPROPion (WELLBUTRIN XL) 300 MG 24 hr tablet Take 300 mg by mouth at bedtime. (Patient not taking: Reported on 12/19/2022)     oxyCODONE (OXY IR/ROXICODONE) 5 MG immediate release tablet Take 1-2  tablets (5-10 mg total) by mouth every 4 (four) hours as needed for moderate pain. (Patient not taking: Reported on 01/30/2022) 30 tablet 0   No current facility-administered medications for this visit.    Allergies:   Patient has no known allergies.    Social History:  The patient  reports that he has never smoked. He has never used smokeless tobacco. He reports current alcohol use. He reports that he does not use drugs.   Family History:  The patient's family history includes CAD in his mother; Cancer in his maternal aunt and mother.    ROS:  Please see the history of present illness.   Otherwise, review of systems are positive for none.   All other systems are reviewed and negative.    PHYSICAL EXAM: VS:  BP 138/80 (BP Location: Left Arm, Patient Position: Sitting, Cuff Size: Normal)   Pulse (!) 57   Ht 6' (1.829 m)   Wt 237 lb 8 oz (107.7 kg)  SpO2 98%   BMI 32.21 kg/m  , BMI Body mass index is 32.21 kg/m. GEN: Well nourished, well developed, in no acute distress  HEENT: normal  Neck: no JVD, carotid bruits, or masses Cardiac: RRR; no murmurs, rubs, or gallops,no edema  Respiratory:  clear to auscultation bilaterally, normal work of breathing GI: soft, nontender, nondistended, + BS MS: no deformity or atrophy  Skin: warm and dry, no rash Neuro:  Strength and sensation are intact Psych: euthymic mood, full affect   EKG:  EKG is ordered today. The ekg ordered today demonstrates sinus bradycardia with no significant ST or T wave changes.   Recent Labs: 01/09/2022: BUN 11; Creatinine, Ser 0.97; Potassium 3.9; Sodium 139 09/16/2022: Hemoglobin 13.5; Platelets 175    Lipid Panel    Component Value Date/Time   CHOL 122 06/04/2020 1549   TRIG 103 06/04/2020 1549   HDL 43 06/04/2020 1549   CHOLHDL 2.8 06/04/2020 1549   CHOLHDL 5.5 04/14/2016 0623   VLDL 31 04/14/2016 0623   LDLCALC 60 06/04/2020 1549   LDLDIRECT 61 06/04/2020 1549      Wt Readings from Last 3  Encounters:  12/19/22 237 lb 8 oz (107.7 kg)  11/06/22 236 lb 12.8 oz (107.4 kg)  09/16/22 234 lb 3.2 oz (106.2 kg)          No data to display            ASSESSMENT AND PLAN:  1. Coronary artery disease involving native coronary arteries without angina: He is doing well overall.  No recent chest pain.  He has mild exertional dyspnea.  Recommend continuing medical therapy.  Continue dual antiplatelet therapy as tolerated but we do have the option of stopping aspirin or clopidogrel if needed.  2.  Hyperlipidemia: I reviewed most recent lipid profile which showed an LDL of 46.  Continue rosuvastatin. 3.  Essential hypertension: Blood pressure is well controlled on current medications.  4.  Carotid artery disease status post  left carotid endarterectomy.  Follow-up carotid Doppler showed mild nonobstructive disease bilaterally.  He follows with vascular surgery.  5.  Borderline mediastinal lymphadenopathy noted on CT scan in July 2023.  Likely reactive.  No symptoms.  6.  Ascending thoracic aortic aneurysm: 4.1 cm .  Will obtain an echocardiogram to evaluate this to avoid excessive radiation with CT scans.   Disposition:   FU with me in 12 months  Signed,  Lorine Bears, MD  12/19/2022 4:55 PM    [facility]

## 2022-12-19 NOTE — Patient Instructions (Signed)
Medication Instructions:  No changes *If you need a refill on your cardiac medications before your next appointment, please call your pharmacy*   Lab Work: None ordered If you have labs (blood work) drawn today and your tests are completely normal, you will receive your results only by: MyChart Message (if you have MyChart) OR A paper copy in the mail If you have any lab test that is abnormal or we need to change your treatment, we will call you to review the results.   Testing/Procedures: Your physician has requested that you have an echocardiogram. Echocardiography is a painless test that uses sound waves to create images of your heart. It provides your doctor with information about the size and shape of your heart and how well your heart's chambers and valves are working.   You may receive an ultrasound enhancing agent through an IV if needed to better visualize your heart during the echo. This procedure takes approximately one hour.  There are no restrictions for this procedure.  This will take place at [address] ([facility] [address], [place] [address]    Follow-Up: At [facility], you and your health needs are our priority.  As part of our continuing mission to provide you with exceptional heart care, we have created designated Provider Care Teams.  These Care Teams include your primary Cardiologist (physician) and Advanced Practice Providers (APPs -  Physician Assistants and Nurse Practitioners) who all work together to provide you with the care you need, when you need it.  We recommend signing up for the patient portal called "MyChart".  Sign up information is provided on this After Visit Summary.  MyChart is used to connect with patients for Virtual Visits (Telemedicine).  Patients are able to view lab/test results, encounter notes, upcoming appointments, etc.  Non-urgent messages can be sent to your provider as well.   To learn more about what you can  do with MyChart, go to [email or web address].    Your next appointment:   12 month(s)  Provider:   You may see Muhammad Arida, MD or one of the following Advanced Practice Providers on your designated Care Team:   Christopher Berge, NP Ryan Dunn, PA-C Cadence Furth, PA-C Sheri Hammock, NP    

## 2023-01-01 ENCOUNTER — Ambulatory Visit
Admission: RE | Admit: 2023-01-01 | Discharge: 2023-01-01 | Disposition: A | Discharge status: Home / Self Care | Payer: BC Managed Care – PPO | Source: Ambulatory Visit | Attending: Oncology | Admitting: Oncology

## 2023-01-01 DIAGNOSIS — R42 Dizziness and giddiness: Secondary | ICD-10-CM | POA: Diagnosis not present

## 2023-01-01 DIAGNOSIS — R9389 Abnormal findings on diagnostic imaging of other specified body structures: Secondary | ICD-10-CM | POA: Diagnosis not present

## 2023-01-01 MED ORDER — GADOBUTROL 1 MMOL/ML IV SOLN
10.0000 mL | Freq: Once | INTRAVENOUS | Status: AC | PRN
  Administered 2023-01-01: 10 mL via INTRAVENOUS

## 2023-01-08 ENCOUNTER — Inpatient Hospital Stay: Payer: BC Managed Care – PPO | Attending: Oncology | Admitting: Oncology

## 2023-01-08 ENCOUNTER — Encounter: Payer: Self-pay | Admitting: Oncology

## 2023-01-08 VITALS — BP 141/85 | HR 66 | Temp 96.2°F | Resp 18 | Wt 236.5 lb

## 2023-01-08 DIAGNOSIS — D649 Anemia, unspecified: Secondary | ICD-10-CM | POA: Insufficient documentation

## 2023-01-08 DIAGNOSIS — R930 Abnormal findings on diagnostic imaging of skull and head, not elsewhere classified: Secondary | ICD-10-CM | POA: Insufficient documentation

## 2023-01-08 NOTE — Progress Notes (Signed)
Hematology/Oncology Consult Note [phone] [phone]      CHIEF COMPLAINTS/PURPOSE OF CONSULTATION:  Abnormal MRI findings   ASSESSMENT & PLAN:   Normocytic anemia Mild normocytic anemia, resolved. He may continue empiric B12 supplemention  Repeat MRI brain findings.  Bright areas on diffusion-weighted imaging in the skull base and cervical spine, nonspecific.  Previously discussed on tumor board, favor benign. Now the repeat MRI showed unchanged findings comparing to 2021, favor benign/indolent process. I will hold of retine MRI image follow up.    Patient is discharged from my clinic. I recommend patient to continue follow up with primary care physician and neurologist. Patient may re-establish care in the future if clinically indicated.  All questions were answered. The patient knows to call the clinic with any problems, questions or concerns.  Rickard Patience, MD, PhD [facility] Hematology Oncology 01/08/2023   HISTORY OF PRESENTING ILLNESS:  Carlos Diaz 62 y.o. male presents to establish care for normocytic anemia.  Patient has experienced headache and had MRI brain done at [facility] recently. 08/20/2022, MRI brain with and without contrast showed nonspecific MRI initially for headache.  Some bright areas on diffusion weighted imaging in the skull base and C1. Correlate with clinical findings of marrow abnormality  09/12/2022, patient's CBC showed mild anemia with hemoglobin 13.4.  ESR negative, CRP negative.  PSA negative.  Patient was referred to hematology for evaluation.  Patient denies any unintentional weight loss, night sweats, fever.  INTERVAL HISTORY Carlos Diaz is a 62 y.o. male who has above history reviewed by me today presents for follow up visit for abormal MRI findings.  He was accompanied by wife. No new complains.  01/06/2023 MRI brain w wo contrast  1. Unremarkable appearance of the brain. 2. Mild chronic marrow diffusion signal  hyperintensity in the skull base and upper cervical spine, nonspecific but unchanged from 2021 favoring a benign/indolent etiology and potentially secondary to known mild anemia.   MEDICAL HISTORY:  Past Medical History:  Diagnosis Date   Anxiety    Arthritis    Carotid artery disease (HCC)    a.) Carotid doppler 11/14/2021: 1-39% RICA; 60-79% LICA; < 50% CCA and ECA. c.) CTA neck 12/04/2021: 70-80% stenosis of prox LICA; mild plaquing in prox RICA without stenosis.   Coronary artery disease 04/14/2016   a.) LHC 04/14/2016: 95% RI, 50% oOM2-OM2, 50% p-mLAD --> PCI performed placing a 2.25 x 15 mm Xience Alpine DES to RI. b.) LHC 12/23/2018: 95% OM1, 40% pLAD, 40% mRCA --> PCI performed placing a 2.25 x 15 mm Resolute Onxy DES to oOM1. c.) LHC 06/25/2020: EF 55-65%; LVEDP norm; 40% p-mRCA, 20% pRCA, 50% p-mLAD, 30% OM2-2; RI and OM2-1 stents patent; intervention deferred opting for medical mgmt.   Depression    Diastolic dysfunction 04/14/2016   a.) TTE 04/14/2016: EF 65%; mild LA dil, G1DD. b.) stress echo 06/26/2016: EF >55%; triv panvalvular regurg, max workload 13.70 METS. c.) TTE 11/23/2018: EF >%%; mild panvalvular regurg; fibrocalcified AoV without stenosis; G1DD.   GERD (gastroesophageal reflux disease)    Hyperlipidemia    Hypertension    Long term current use of antithrombotics/antiplatelets    a.) on daily DAPT therapy (ASA + clopidogrel)   Lyme disease    NSTEMI (non-ST elevated myocardial infarction) (HCC) 04/13/2016   a.) LHC 04/14/2016 --> 95% RI, 50% oOM2-OM2, 50% p-mLAD --> PCI performed placing a 2.25 x 15 mm Xience Alpine DES to the RI   Tinnitus    Unstable angina (HCC)  SURGICAL HISTORY: Past Surgical History:  Procedure Laterality Date   CARDIAC CATHETERIZATION Right 04/14/2016   Procedure: Left Heart Cath and Coronary Angiography;  Surgeon: Laurier Nancy, MD;  Location: [facility] IN[facility];  Service: Cardiovascular;  Laterality: Right;   CARDIAC  CATHETERIZATION N/A 04/14/2016   Procedure: Coronary Stent Intervention;  Surgeon: Alwyn Pea, MD;  Location: [facility];  Service: Cardiovascular;  Laterality: N/A;   COLONOSCOPY     COLONOSCOPY WITH PROPOFOL N/A 03/26/2020   Procedure: COLONOSCOPY WITH PROPOFOL;  Surgeon: Regis Bill, MD;  Location: [facility] ENDOS[facility];  Service: Endoscopy;  Laterality: N/A;   CORONARY STENT INTERVENTION N/A 12/23/2018   Procedure: CORONARY STENT INTERVENTION;  Surgeon: Marcina Millard, MD;  Location: [facility];  Service: Cardiovascular;  Laterality: N/A;   ENDARTERECTOMY Left 01/08/2022   Procedure: ENDARTERECTOMY CAROTID;  Surgeon: Renford Dills, MD;  Location: [facility] ORS;  Service: Vascular;  Laterality: Left;   LEFT HEART CATH AND CORONARY ANGIOGRAPHY Left 12/23/2018   Procedure: LEFT HEART CATH AND CORONARY ANGIOGRAPHY;  Surgeon: Laurier Nancy, MD;  Location: [facility];  Service: Cardiovascular;  Laterality: Left;   LEFT HEART CATH AND CORONARY ANGIOGRAPHY Left 06/25/2020   Procedure: LEFT HEART CATH AND CORONARY ANGIOGRAPHY;  Surgeon: Iran Ouch, MD;  Location: [facility];  Service: Cardiovascular;  Laterality: Left;   PORT-A-CATH REMOVAL     PORTACATH PLACEMENT      SOCIAL HISTORY: Social History   Socioeconomic History   Marital status: Married    Spouse name: Not on file   Number of children: Not on file   Years of education: Not on file   Highest education level: Not on file  Occupational History   Not on file  Tobacco Use   Smoking status: Never   Smokeless tobacco: Never  Vaping Use   Vaping Use: Never used  Substance and Sexual Activity   Alcohol use: Yes    Comment: occ beer   Drug use: No   Sexual activity: Not on file  Other Topics Concern   Not on file  Social History Narrative   Lives at home with wife and daughter.   Social Determinants of Health   Financial Resource Strain: Not on file  Food  Insecurity: Not on file  Transportation Needs: Not on file  Physical Activity: Not on file  Stress: Not on file  Social Connections: Not on file  Intimate Partner Violence: Not on file    FAMILY HISTORY: Family History  Problem Relation Age of Onset   Cancer Mother        lung cancer with brain mets   CAD Mother    Cancer Maternal Aunt        brain cancer    ALLERGIES:  has No Known Allergies.  MEDICATIONS:  Current Outpatient Medications  Medication Sig Dispense Refill   aspirin EC 81 MG tablet Take 1 tablet (81 mg total) by mouth daily. Swallow whole. (Patient taking differently: Take 81 mg by mouth every morning. Swallow whole.) 90 tablet 3   citalopram (CELEXA) 40 MG tablet Take 40 mg by mouth at bedtime.     clopidogrel (PLAVIX) 75 MG tablet Take 75 mg by mouth daily.     isosorbide mononitrate (IMDUR) 60 MG 24 hr tablet TAKE 1 TABLET BY MOUTH EVERY DAY 90 tablet 1   metoprolol succinate (TOPROL-XL) 25 MG 24 hr tablet TAKE 1 TABLET (25 MG TOTAL) BY MOUTH DAILY. 90  tablet 1   nitroGLYCERIN (NITROSTAT) 0.4 MG SL tablet PLACE 1 TABLET UNDER THE TONGUE EVERY 5 MINUTES AS NEEDED. 25 tablet 3   pantoprazole (PROTONIX) 40 MG tablet Take 1 tablet (40 mg total) by mouth daily. 30 tablet 0   rosuvastatin (CRESTOR) 40 MG tablet TAKE 1 TABLET BY MOUTH EVERY DAY 90 tablet 2   buPROPion (WELLBUTRIN XL) 300 MG 24 hr tablet Take 300 mg by mouth at bedtime. (Patient not taking: Reported on 12/19/2022)     oxyCODONE (OXY IR/ROXICODONE) 5 MG immediate release tablet Take 1-2 tablets (5-10 mg total) by mouth every 4 (four) hours as needed for moderate pain. (Patient not taking: Reported on 01/30/2022) 30 tablet 0   No current facility-administered medications for this visit.    Review of Systems  Constitutional:  Negative for appetite change, chills, fatigue, fever and unexpected weight change.  HENT:   Negative for hearing loss and voice change.   Eyes:  Negative for eye problems and icterus.   Respiratory:  Negative for chest tightness, cough and shortness of breath.   Cardiovascular:  Negative for chest pain and leg swelling.  Gastrointestinal:  Negative for abdominal distention and abdominal pain.  Endocrine: Negative for hot flashes.  Genitourinary:  Negative for difficulty urinating, dysuria and frequency.   Musculoskeletal:  Negative for arthralgias.  Skin:  Negative for itching and rash.  Neurological:  Positive for headaches. Negative for light-headedness and numbness.  Hematological:  Negative for adenopathy. Does not bruise/bleed easily.  Psychiatric/Behavioral:  Negative for confusion.      PHYSICAL EXAMINATION: ECOG PERFORMANCE STATUS: 0 - Asymptomatic  Vitals:   01/08/23 1342  BP: (!) 141/85  Pulse: 66  Resp: 18  Temp: (!) 96.2 F (35.7 C)  SpO2: 100%   Filed Weights   01/08/23 1342  Weight: 236 lb 8 oz (107.3 kg)    Physical Exam Constitutional:      General: He is not in acute distress.    Appearance: He is not diaphoretic.  HENT:     Head: Normocephalic.  Eyes:     General: No scleral icterus. Cardiovascular:     Rate and Rhythm: Normal rate.  Pulmonary:     Effort: Pulmonary effort is normal. No respiratory distress.  Abdominal:     General: There is no distension.  Musculoskeletal:        General: Normal range of motion.     Cervical back: Neck supple.  Skin:    Findings: No rash.  Neurological:     Mental Status: He is alert and oriented to person, place, and time. Mental status is at baseline.     Cranial Nerves: No cranial nerve deficit.     Motor: No abnormal muscle tone.  Psychiatric:        Mood and Affect: Affect normal.      LABORATORY DATA:  I have reviewed the data as listed    Latest Ref Rng & Units 09/16/2022    4:09 PM 01/09/2022    4:30 AM 01/06/2022    3:00 PM  CBC  WBC 4.0 - 10.5 K/uL 6.5  9.0  8.1   Hemoglobin 13.0 - 17.0 g/dL 40.9  81.1  91.4   Hematocrit 39.0 - 52.0 % 39.9  36.5  37.2   Platelets 150 -  400 K/uL 175  149  168       Latest Ref Rng & Units 01/09/2022    4:30 AM 01/06/2022    3:00 PM 12/04/2021  3:20 PM  CMP  Glucose 70 - 99 mg/dL 161  89    BUN 8 - 23 mg/dL 11  22    Creatinine 0.96 - 1.24 mg/dL 0.45  4.09  8.11   Sodium 135 - 145 mmol/L 139  139    Potassium 3.5 - 5.1 mmol/L 3.9  3.7    Chloride 98 - 111 mmol/L 109  106    CO2 22 - 32 mmol/L 26  24    Calcium 8.9 - 10.3 mg/dL 8.1  9.3       RADIOGRAPHIC STUDIES: I have personally reviewed the radiological images as listed and agreed with the findings in the report. MR Brain W Wo Contrast  Result Date: 01/06/2023 CLINICAL DATA:  Abnormal MRI.  Dizziness. EXAM: MRI HEAD WITHOUT AND WITH CONTRAST TECHNIQUE: Multiplanar, multiecho pulse sequences of the brain and surrounding structures were obtained without and with intravenous contrast. CONTRAST:  10mL GADAVIST GADOBUTROL 1 MMOL/ML IV SOLN COMPARISON:  Head MRI 08/20/2022 from [facility] and 12/30/2019 from [facility] FINDINGS: Brain: There is no evidence of an acute infarct, intracranial hemorrhage, mass, midline shift, or extra-axial fluid collection. The ventricles and sulci are normal. The brain is normal in signal. No abnormal brain parenchymal or meningeal enhancement is identified. 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 Vascular: Major intracranial vascular flow voids are preserved. Skull and upper cervical spine: Mild trace diffusion weighted signal hyperintensity in the marrow of the skull base and upper cervical spine is similar to the 2021 study, and no suspicious focal marrow lesion is identified on conventional imaging sequences. Sinuses/Orbits: Unremarkable orbits. Small right mastoid effusion. No significant inflammatory changes in the paranasal sinuses. Other: None. IMPRESSION: 1. Unremarkable appearance of the brain. 2. Mild chronic marrow diffusion signal hyperintensity in the skull base and  upper cervical spine, nonspecific but unchanged from 2021 favoring a benign/indolent etiology and potentially secondary to known mild anemia. Electronically Signed   By: Sebastian Ache M.D.   On: 01/06/2023 16:54

## 2023-01-08 NOTE — Assessment & Plan Note (Addendum)
Mild normocytic anemia, resolved. He may continue empiric B12 supplemention  Repeat MRI brain findings.  Bright areas on diffusion-weighted imaging in the skull base and cervical spine, nonspecific.  Previously discussed on tumor board, favor benign. Now the repeat MRI showed unchanged findings comparing to 2021, favor benign/indolent process. I will hold of retine MRI image follow up.

## 2023-01-11 ENCOUNTER — Other Ambulatory Visit: Payer: Self-pay | Admitting: Cardiovascular Disease

## 2023-01-11 DIAGNOSIS — I2510 Atherosclerotic heart disease of native coronary artery without angina pectoris: Secondary | ICD-10-CM

## 2023-01-19 ENCOUNTER — Ambulatory Visit: Payer: BC Managed Care – PPO | Attending: Cardiovascular Disease

## 2023-01-19 DIAGNOSIS — I7121 Aneurysm of the ascending aorta, without rupture: Secondary | ICD-10-CM

## 2023-01-19 DIAGNOSIS — R0602 Shortness of breath: Secondary | ICD-10-CM | POA: Diagnosis not present

## 2023-01-19 LAB — ECHOCARDIOGRAM COMPLETE
AR max vel: 3.61 cm2
AV Area VTI: 3.75 cm2
AV Area mean vel: 3.49 cm2
AV Mean grad: 5 mmHg
AV Peak grad: 8.5 mmHg
Ao pk vel: 1.46 m/s
Area-P 1/2: 3.12 cm2
S' Lateral: 2.7 cm

## 2023-01-21 DIAGNOSIS — G44219 Episodic tension-type headache, not intractable: Secondary | ICD-10-CM | POA: Diagnosis not present

## 2023-01-27 ENCOUNTER — Ambulatory Visit: Payer: BC Managed Care – PPO | Admitting: Physician Assistant

## 2023-02-24 NOTE — Progress Notes (Unsigned)
Cardiology Office Note    Date:  02/26/2023   ID:  Carlos Diaz, DOB 1960/12/04, MRN [id number]  PCP:  Dione Housekeeper, MD  Cardiologist:  Lorine Bears, MD  Electrophysiologist:  None   Chief Complaint: Follow-up  History of Present Illness:   [name] is a 62 y.o. male with history of CAD with multiple PCI's as outlined below, ascending thoracic aortic aneurysm measuring 4.1 cm by CTA in 03/2022, HTN, HLD, carotid artery disease status post left sided carotid endarterectomy for asymptomatic high-grade stenosis in 12/2021, anxiety, and depression who presents for follow-up of echo.   He was admitted in 2017 with a NSTEMI.  LHC showed high-grade stenosis in the ramus intermedius which was successfully treated with PCI/DES.  He had recurrent angina in 12/2018 with repeat LHC showing a patent ramus stent.  There was high-grade stenosis in a proximal OM branch which was treated with PCI/DES.  He was also noted to have moderate proximal to mid LAD stenosis along with mild RCA disease.  He was seen in 06/2020 with atypical chest pain with subsequent Lexiscan MPI showing evidence of anterior and anteroseptal ischemia with normal EF.  Based on that, he underwent repeat LHC in 06/2020 which demonstrated patent stents in the ramus and OM2 with no significant restenosis.  There was stable moderate proximal LAD and mid RCA disease.  He had a normal LV systolic function and LVEDP.  Medical therapy was recommended.  He was seen in the ED in 09/2021 with chest an abdominal pain and elevated BP (felt to be related to wrong BP cuff size).  High-sensitivity troponin negative x2.  CT of the abdomen/pelvis was unrevealing.  He was treated with GI cocktail.  He subsequently underwent Lexiscan MPI in 10/2021 which showed no evidence of ischemia and was overall low risk with a normal LVEF.  Coronary artery calcification was noted in the LAD and LCx.  He had a screening carotid exam which reportedly demonstrated  left-sided "critical stenosis."  Subsequent carotid artery ultrasound on 11/14/2021 demonstrated 1 to 39% RICA stenosis with less than 50% RECA stenosis, 60 to 79% LICA stenosis with less than 50% LECA stenosis, bilateral antegrade flow within the vertebral arteries, and normal flow hemodynamics in the bilateral subclavian arteries.  He was subsequently evaluated by vascular surgery on 11/21/2021 with CTA of the neck on 12/04/2021 demonstrated proximal LICA stenosis of 70 to 80% with mild plaquing in the proximal RICA without hemodynamically significant stenosis with subsequent left-sided CEA in 12/2021.  Incidentally, there was a cluster of nodules in the right upper lobe measuring up to 6 mm that were possibly infectious versus inflammatory with recommendation of follow-up chest CT in 3 months.  Follow-up CT chest in 03/2022 showed a 6 mm anterior right upper lobe pulmonary nodule that was previously seen and stable with cluster of right upper lobe nodularity felt to be sequelae from infectious/inflammatory etiology.  Mediastinal lymphadenopathy was noted with recommendation for follow-up chest CT in 3 months which remains pending at this time.  A 4.1 ascending thoracic aortic aneurysm was also noted.  He was last seen in the office in 12/2022 and was without symptoms of angina or cardiac decompensation.  No changes were indicated at that time.    Carotid artery ultrasound in 10/2022 showed 1 to 39% bilateral ICA stenosis.  MRI of the brain on 01/06/2023 showed no acute changes with mild chronic marrow diffusion signal hyperintensity in the skull base and apical cervical spine that was nonspecific  and unchanged from 2021 favoring a benign/indolent etiology.  To further evaluate his ascending thoracic aortic aneurysm, he underwent echo on 01/19/2023 which showed an EF of 60 to 65%, no regional wall motion abnormalities, moderate asymmetric LVH with no significant LVOT gradient, grade 1 diastolic dysfunction, normal RV  systolic function and ventricular cavity size, mild mitral regurgitation, tricuspid aortic valve without evidence of insufficiency or regurgitation, mild dilatation of the aortic root measuring 41 mm, mild dilatation of the ascending aorta measuring 43 mm, and a normal CVP.  He comes in doing well from a cardiac perspective and is without symptoms of angina or cardiac decompensation.  No palpitations, dizziness, presyncope, or syncope.  No significant lower extremity swelling or progressive orthopnea.  Remains active at baseline, exercising on a regular basis.  Not currently checking blood pressures at home.  Adherent to cardiac medications.  Does have concerns regarding ongoing statin therapy regarding potential off target effects.   Labs independently reviewed: 09/2022 - Hgb 13.5, PLT 175, potassium 4.0, BUN 17, serum creatinine 1.1, albumin 4.1, AST/ALT normal 08/2022 - TC 99, TG 119, HDL 29, LDL 46 05/2022 - A1c 5.8 07/2020 - TSH normal  Past Medical History:  Diagnosis Date   Anxiety    Arthritis    Carotid artery disease (HCC)    a.) Carotid doppler 11/14/2021: 1-39% RICA; 60-79% LICA; < 50% CCA and ECA. c.) CTA neck 12/04/2021: 70-80% stenosis of prox LICA; mild plaquing in prox RICA without stenosis.   Coronary artery disease 04/14/2016   a.) LHC 04/14/2016: 95% RI, 50% oOM2-OM2, 50% p-mLAD --> PCI performed placing a 2.25 x 15 mm Xience Alpine DES to RI. b.) LHC 12/23/2018: 95% OM1, 40% pLAD, 40% mRCA --> PCI performed placing a 2.25 x 15 mm Resolute Onxy DES to oOM1. c.) LHC 06/25/2020: EF 55-65%; LVEDP norm; 40% p-mRCA, 20% pRCA, 50% p-mLAD, 30% OM2-2; RI and OM2-1 stents patent; intervention deferred opting for medical mgmt.   Depression    Diastolic dysfunction 04/14/2016   a.) TTE 04/14/2016: EF 65%; mild LA dil, G1DD. b.) stress echo 06/26/2016: EF >55%; triv panvalvular regurg, max workload 13.70 METS. c.) TTE 11/23/2018: EF >%%; mild panvalvular regurg; fibrocalcified AoV without  stenosis; G1DD.   GERD (gastroesophageal reflux disease)    Hyperlipidemia    Hypertension    Long term current use of antithrombotics/antiplatelets    a.) on daily DAPT therapy (ASA + clopidogrel)   Lyme disease    NSTEMI (non-ST elevated myocardial infarction) (HCC) 04/13/2016   a.) LHC 04/14/2016 --> 95% RI, 50% oOM2-OM2, 50% p-mLAD --> PCI performed placing a 2.25 x 15 mm Xience Alpine DES to the RI   Tinnitus    Unstable angina [facility])     Past Surgical History:  Procedure Laterality Date   CARDIAC CATHETERIZATION Right 04/14/2016   Procedure: Left Heart Cath and Coronary Angiography;  Surgeon: Laurier Nancy, MD;  Location: [facility];  Service: Cardiovascular;  Laterality: Right;   CARDIAC CATHETERIZATION N/A 04/14/2016   Procedure: Coronary Stent Intervention;  Surgeon: Alwyn Pea, MD;  Location: [facility];  Service: Cardiovascular;  Laterality: N/A;   COLONOSCOPY     COLONOSCOPY WITH PROPOFOL N/A 03/26/2020   Procedure: COLONOSCOPY WITH PROPOFOL;  Surgeon: Regis Bill, MD;  Location: [facility]OS[facility];  Service: Endoscopy;  Laterality: N/A;   CORONARY STENT INTERVENTION N/A 12/23/2018   Procedure: CORONARY STENT INTERVENTION;  Surgeon: Marcina Millard, MD;  Location: [facility];  Service: Cardiovascular;  Laterality: N/A;   ENDARTERECTOMY Left 01/08/2022   Procedure: ENDARTERECTOMY CAROTID;  Surgeon: Renford Dills, MD;  Location: [facility] ORS;  Service: Vascular;  Laterality: Left;   LEFT HEART CATH AND CORONARY ANGIOGRAPHY Left 12/23/2018   Procedure: LEFT HEART CATH AND CORONARY ANGIOGRAPHY;  Surgeon: Laurier Nancy, MD;  Location: [facility];  Service: Cardiovascular;  Laterality: Left;   LEFT HEART CATH AND CORONARY ANGIOGRAPHY Left 06/25/2020   Procedure: LEFT HEART CATH AND CORONARY ANGIOGRAPHY;  Surgeon: Iran Ouch, MD;  Location: [facility];  Service: Cardiovascular;  Laterality: Left;    PORT-A-CATH REMOVAL     PORTACATH PLACEMENT      Current Medications: Current Meds  Medication Sig   aspirin EC 81 MG tablet Take 1 tablet (81 mg total) by mouth daily. Swallow whole. (Patient taking differently: Take 81 mg by mouth every morning. Swallow whole.)   Bempedoic Acid (NEXLETOL) 180 MG TABS Take 1 tablet (180 mg total) by mouth daily at 12 noon.   carvedilol (COREG) 25 MG tablet Take 1 tablet (25 mg total) by mouth 2 (two) times daily.   citalopram (CELEXA) 40 MG tablet Take 40 mg by mouth at bedtime.   clopidogrel (PLAVIX) 75 MG tablet Take 75 mg by mouth daily.   isosorbide mononitrate (IMDUR) 60 MG 24 hr tablet TAKE 1 TABLET BY MOUTH EVERY DAY   nitroGLYCERIN (NITROSTAT) 0.4 MG SL tablet PLACE 1 TABLET UNDER THE TONGUE EVERY 5 MINUTES AS NEEDED.   pantoprazole (PROTONIX) 40 MG tablet Take 1 tablet (40 mg total) by mouth daily.   rosuvastatin (CRESTOR) 40 MG tablet TAKE 1 TABLET BY MOUTH EVERY DAY   [DISCONTINUED] metoprolol succinate (TOPROL-XL) 25 MG 24 hr tablet TAKE 1 TABLET (25 MG TOTAL) BY MOUTH DAILY.    Allergies:   Patient has no known allergies.   Social History   Socioeconomic History   Marital status: Married    Spouse name: Not on file   Number of children: Not on file   Years of education: Not on file   Highest education level: Not on file  Occupational History   Not on file  Tobacco Use   Smoking status: Never   Smokeless tobacco: Never  Vaping Use   Vaping Use: Never used  Substance and Sexual Activity   Alcohol use: Yes    Comment: occ beer   Drug use: No   Sexual activity: Not on file  Other Topics Concern   Not on file  Social History Narrative   Lives at home with wife and daughter.   Social Determinants of Health   Financial Resource Strain: Not on file  Food Insecurity: Not on file  Transportation Needs: Not on file  Physical Activity: Not on file  Stress: Not on file  Social Connections: Not on file     Family History:  The  patient's family history includes CAD in his mother; Cancer in his maternal aunt and mother.  ROS:   12-point review of systems is negative unless otherwise noted in the HPI.   EKGs/Labs/Other Studies Reviewed:    Studies reviewed were summarized above. The additional studies were reviewed today:  2D echo 01/19/2023: 1. Left ventricular ejection fraction, by estimation, is 60 to 65%. The  left ventricle has normal function. The left ventricle has no regional  wall motion abnormalities. There is moderate asymmetric left ventricular  hypertrophy of the basal-septal  segment. No significant LVOT gradient measured. Left ventricular diastolic  parameters  are consistent with Grade I diastolic dysfunction (impaired  relaxation).   2. Right ventricular systolic function is normal. The right ventricular  size is normal.   3. The mitral valve is normal in structure. Mild mitral valve  regurgitation. No evidence of mitral stenosis.   4. The aortic valve is tricuspid. Aortic valve regurgitation is not  visualized. No aortic stenosis is present.   5. There is mild dilatation of the aortic root, measuring 41 mm. There is  mild dilatation of the ascending aorta, measuring 43 mm.   6. The inferior vena cava is normal in size with greater than 50%  respiratory variability, suggesting right atrial pressure of 3 mmHg.  __________  Carotid artery ultrasound 11/06/2022: Summary:  Right Carotid: Velocities in the right ICA are consistent with a 1-39% stenosis.   Left Carotid: Velocities in the left ICA are consistent with a 1-39% stenosis.   Vertebrals: Bilateral vertebral arteries demonstrate antegrade flow.  Subclavians: Normal flow hemodynamics were seen in bilateral subclavian arteries.  __________   Eugenie Birks MPI 11/13/2021:   Findings are consistent with no ischemia. The study is low risk.   No ST deviation was noted.   LV perfusion is normal.   Left ventricular function is normal. LVEF is  56%.   Coronary artery calcification noted in the LAD, LCx __________   [facility] 06/25/2020: The left ventricular systolic function is normal. LV end diastolic pressure is normal. The left ventricular ejection fraction is 55-65% by visual estimate. Prox RCA to Mid RCA lesion is 40% stenosed. Prox RCA lesion is 20% stenosed. Prox LAD to Mid LAD lesion is 50% stenosed. Previously placed Ramus stent (unknown type) is widely patent. Previously placed 2nd Mrg-1 stent (unknown type) is widely patent. 2nd Mrg-2 lesion is 30% stenosed.   1.  Patent stents in OM 2 and ramus with no significant restenosis.  Stable moderate proximal LAD and mid RCA disease. 2.  Normal LV systolic function and normal left ventricular end-diastolic pressure.   Recommendations: Continue aggressive medical therapy. __________   Eugenie Birks MPI 06/19/2020: Abnormal, potentially high-risk pharmacologic myocardial perfusion stress test. There is a large in size, moderate in severity, nearly completely reversible defect involving the anterior and anteroseptal walls that is concerning for ischemia though an element of artifact cannot be excluded. The left ventricular ejection fraction is normal (60%). Attenuation correction demonstrates coronary stents and mild aortic atherosclerosis. __________   LHC 12/23/2018: Prox LAD lesion is 40% stenosed. Ost 1st Mrg lesion is 95% stenosed. Mid RCA lesion is 40% stenosed. A drug-eluting stent was successfully placed using a STENT RESOLUTE ONYX 2.25X15. Post intervention, there is a 0% residual stenosis.   1.  One-vessel CAD with 95% stenosis proximal OM1 2.  Successful PCI with DES proximal OM1 __________     See Epic for more remote cardiac studies   EKG:  EKG is not ordered today.    Recent Labs: 09/16/2022: Hemoglobin 13.5; Platelets 175  Recent Lipid Panel    Component Value Date/Time   CHOL 122 06/04/2020 1549   TRIG 103 06/04/2020 1549   HDL 43 06/04/2020 1549    CHOLHDL 2.8 06/04/2020 1549   CHOLHDL 5.5 04/14/2016 0623   VLDL 31 04/14/2016 0623   LDLCALC 60 06/04/2020 1549   LDLDIRECT 61 06/04/2020 1549    PHYSICAL EXAM:    VS:  BP (!) 162/92   Pulse 67   Ht 5\' 11"  (1.803 m)   Wt 239 lb 6.4 oz (108.6 kg)   SpO2  99%   BMI 33.39 kg/m   BMI: Body mass index is 33.39 kg/m.  Physical Exam Vitals reviewed.  Constitutional:      Appearance: He is well-developed.  HENT:     Head: Normocephalic and atraumatic.  Eyes:     General:        Right eye: No discharge.        Left eye: No discharge.  Neck:     Vascular: No JVD.  Cardiovascular:     Rate and Rhythm: Normal rate and regular rhythm.     Heart sounds: Normal heart sounds, S1 normal and S2 normal. Heart sounds not distant. No midsystolic click and no opening snap. No murmur heard.    No friction rub.  Pulmonary:     Effort: Pulmonary effort is normal. No respiratory distress.     Breath sounds: Normal breath sounds. No decreased breath sounds, wheezing or rales.  Chest:     Chest wall: No tenderness.  Abdominal:     General: There is no distension.  Musculoskeletal:     Cervical back: Normal range of motion.  Skin:    General: Skin is warm and dry.     Nails: There is no clubbing.  Neurological:     Mental Status: He is alert and oriented to person, place, and time.  Psychiatric:        Speech: Speech normal.        Behavior: Behavior normal.        Thought Content: Thought content normal.        Judgment: Judgment normal.     Wt Readings from Last 3 Encounters:  02/26/23 239 lb 6.4 oz (108.6 kg)  01/08/23 236 lb 8 oz (107.3 kg)  12/19/22 237 lb 8 oz (107.7 kg)     ASSESSMENT & PLAN:   CAD involving native coronary arteries without angina: He is doing well and without symptoms concerning for angina or cardiac decompensation.  Continue aggressive risk factor modification and secondary prevention including aspirin, clopidogrel, carvedilol in place of metoprolol as  outlined below, and Imdur.  We are looking to transition from rosuvastatin to bempedoic acid as outlined below.  No indication for further ischemic testing at this time.  Ascending thoracic aortic aneurysm: Noted on CT in 03/2022 measuring 4.1 cm.  Echo last month showed slight progression of thoracic aortic aneurysm measuring 4.3 cm.  This can be further evaluated with a CT chest as outlined below to follow-up on mediastinal lymphadenopathy.  Optimal blood pressure and cholesterol control is encouraged.  HTN: Initial blood pressure 163/91 with repeat blood pressure at the end of his visit of 162/92.  Blood pressures are typically well-controlled in the office.  Transition from metoprolol to carvedilol 25 mg twice daily.  He will otherwise continue current dose of Imdur.  He will contact our office in 2 weeks with updated BP readings with further recommendations pending.  Already consumes a diet low in sodium.  HLD: LDL 46 in 08/2022.  Currently on rosuvastatin 40 mg.  He does express some concern regarding ongoing statin use given potential off target effects.  We will undergo a trial of Nexletol 180 mg daily with discontinuation of rosuvastatin when he receives bempedoic acid.  Would recommend follow-up fasting lipid panel, if he is able to obtain bempedoic acid, in 2 to 3 months.  Carotid artery disease: Status post left-sided CEA.  Most recent carotid artery ultrasound from 10/2022 demonstrated 1 to 39% bilateral ICA stenosis.  Pulmonary nodule/nodularity  borderline mediastinal lymphadenopathy: Noted on CT scan in 03/2022.  Follow-up CT chest.         Disposition: F/u with Dr. Kirke Corin or an APP in 3 months.   Medication Adjustments/Labs and Tests Ordered: Current medicines are reviewed at length with the patient today.  Concerns regarding medicines are outlined above. Medication changes, Labs and Tests ordered today are summarized above and listed in the Patient Instructions accessible in  Encounters.   Signed, Eula Listen, PA-C 02/26/2023 4:57 PM     [facility] - [place] [address] [place], [place] [address] [phone]

## 2023-02-26 ENCOUNTER — Encounter: Payer: Self-pay | Admitting: Physician Assistant

## 2023-02-26 ENCOUNTER — Ambulatory Visit: Payer: BC Managed Care – PPO | Attending: Physician Assistant | Admitting: Physician Assistant

## 2023-02-26 VITALS — BP 162/92 | HR 67 | Ht 71.0 in | Wt 239.4 lb

## 2023-02-26 DIAGNOSIS — I779 Disorder of arteries and arterioles, unspecified: Secondary | ICD-10-CM

## 2023-02-26 DIAGNOSIS — I6522 Occlusion and stenosis of left carotid artery: Secondary | ICD-10-CM | POA: Diagnosis not present

## 2023-02-26 DIAGNOSIS — E785 Hyperlipidemia, unspecified: Secondary | ICD-10-CM

## 2023-02-26 DIAGNOSIS — I2510 Atherosclerotic heart disease of native coronary artery without angina pectoris: Secondary | ICD-10-CM | POA: Diagnosis not present

## 2023-02-26 DIAGNOSIS — I7121 Aneurysm of the ascending aorta, without rupture: Secondary | ICD-10-CM

## 2023-02-26 DIAGNOSIS — R590 Localized enlarged lymph nodes: Secondary | ICD-10-CM

## 2023-02-26 DIAGNOSIS — I10 Essential (primary) hypertension: Secondary | ICD-10-CM

## 2023-02-26 MED ORDER — NEXLETOL 180 MG PO TABS: 180.0000 mg | ORAL_TABLET | Freq: Every day | ORAL | 11 refills | Status: DC

## 2023-02-26 MED ORDER — CARVEDILOL 25 MG PO TABS: 25.0000 mg | ORAL_TABLET | Freq: Two times a day (BID) | ORAL | 3 refills | Status: DC

## 2023-02-26 NOTE — Patient Instructions (Addendum)
Medication Instructions:  Your physician has recommended you make the following change in your medication:   START Nexletol 180 mg once daily  STOP Metoprolol  START Carvedilol 25 mg twice a day  Stay on current statin (Rosuvastatin) until you are able to get the nexletol. Once that is approved give [place] a call.   *If you need a refill on your cardiac medications before your next appointment, please call your pharmacy*   Lab Work: None  If you have labs (blood work) drawn today and your tests are completely normal, you will receive your results only by: MyChart Message (if you have MyChart) OR A paper copy in the mail If you have any lab test that is abnormal or we need to change your treatment, we will call you to review the results.   Testing/Procedures:  Please call [phone] to get this test scheduled.   Non-Cardiac CT scanning, (CAT scanning), is a noninvasive, special x-ray that produces cross-sectional images of the body using x-rays and a computer. CT scans help physicians diagnose and treat medical conditions. For some CT exams, a contrast material is used to enhance visibility in the area of the body being studied. CT scans provide greater clarity and reveal more details than regular x-ray exams.    Follow-Up: At [facility], you and your health needs are our priority.  As part of our continuing mission to provide you with exceptional heart care, we have created designated Provider Care Teams.  These Care Teams include your primary Cardiologist (physician) and Advanced Practice Providers (APPs -  Physician Assistants and Nurse Practitioners) who all work together to provide you with the care you need, when you need it.   Your next appointment:   3 month(s)  Provider:   Lorine Bears, MD or Eula Listen, PA-C

## 2023-03-16 DIAGNOSIS — M2241 Chondromalacia patellae, right knee: Secondary | ICD-10-CM | POA: Diagnosis not present

## 2023-04-09 DIAGNOSIS — M25561 Pain in right knee: Secondary | ICD-10-CM | POA: Diagnosis not present

## 2023-04-14 DIAGNOSIS — M25561 Pain in right knee: Secondary | ICD-10-CM | POA: Diagnosis not present

## 2023-04-14 DIAGNOSIS — M1711 Unilateral primary osteoarthritis, right knee: Secondary | ICD-10-CM | POA: Diagnosis not present

## 2023-04-21 DIAGNOSIS — M25561 Pain in right knee: Secondary | ICD-10-CM | POA: Diagnosis not present

## 2023-04-28 DIAGNOSIS — M25561 Pain in right knee: Secondary | ICD-10-CM | POA: Diagnosis not present

## 2023-05-05 DIAGNOSIS — M25561 Pain in right knee: Secondary | ICD-10-CM | POA: Diagnosis not present

## 2023-05-12 DIAGNOSIS — M1711 Unilateral primary osteoarthritis, right knee: Secondary | ICD-10-CM | POA: Diagnosis not present

## 2023-05-25 NOTE — Progress Notes (Deleted)
Cardiology Office Note    Date:  05/25/2023   ID:  Carlos Diaz, DOB Jun 17, 1961, MRN [id number]  PCP:  Dione Housekeeper, MD  Cardiologist:  Lorine Bears, MD  Electrophysiologist:  None   Chief Complaint: ***  History of Present Illness:   Carlos Diaz is a 62 y.o. male with history of ***  ***   Labs independently reviewed: 09/2022 - Hgb 13.5, PLT 175, potassium 4.0, BUN 17, serum creatinine 1.1, albumin 4.1, AST/ALT normal 08/2022 - TC 99, TG 119, HDL 29, LDL 46 05/2022 - A1c 5.8 07/2020 - TSH normal  Past Medical History:  Diagnosis Date   Anxiety    Arthritis    Carotid artery disease (HCC)    a.) Carotid doppler 11/14/2021: 1-39% RICA; 60-79% LICA; < 50% CCA and ECA. c.) CTA neck 12/04/2021: 70-80% stenosis of prox LICA; mild plaquing in prox RICA without stenosis.   Coronary artery disease 04/14/2016   a.) LHC 04/14/2016: 95% RI, 50% oOM2-OM2, 50% p-mLAD --> PCI performed placing a 2.25 x 15 mm Xience Alpine DES to RI. b.) LHC 12/23/2018: 95% OM1, 40% pLAD, 40% mRCA --> PCI performed placing a 2.25 x 15 mm Resolute Onxy DES to oOM1. c.) LHC 06/25/2020: EF 55-65%; LVEDP norm; 40% p-mRCA, 20% pRCA, 50% p-mLAD, 30% OM2-2; RI and OM2-1 stents patent; intervention deferred opting for medical mgmt.   Depression    Diastolic dysfunction 04/14/2016   a.) TTE 04/14/2016: EF 65%; mild LA dil, G1DD. b.) stress echo 06/26/2016: EF >55%; triv panvalvular regurg, max workload 13.70 METS. c.) TTE 11/23/2018: EF >%%; mild panvalvular regurg; fibrocalcified AoV without stenosis; G1DD.   GERD (gastroesophageal reflux disease)    Hyperlipidemia    Hypertension    Long term current use of antithrombotics/antiplatelets    a.) on daily DAPT therapy (ASA + clopidogrel)   Lyme disease    NSTEMI (non-ST elevated myocardial infarction) (HCC) 04/13/2016   a.) LHC 04/14/2016 --> 95% RI, 50% oOM2-OM2, 50% p-mLAD --> PCI performed placing a 2.25 x 15 mm Xience Alpine DES to the RI    Tinnitus    Unstable angina [facility])     Past Surgical History:  Procedure Laterality Date   CARDIAC CATHETERIZATION Right 04/14/2016   Procedure: Left Heart Cath and Coronary Angiography;  Surgeon: Laurier Nancy, MD;  Location: [facility] INVASIVE CV [facility];  Service: Cardiovascular;  Laterality: Right;   CARDIAC CATHETERIZATION N/A 04/14/2016   Procedure: Coronary Stent Intervention;  Surgeon: Alwyn Pea, MD;  Location: [facility] CV [facility];  Service: Cardiovascular;  Laterality: N/A;   COLONOSCOPY     COLONOSCOPY WITH PROPOFOL N/A 03/26/2020   Procedure: COLONOSCOPY WITH PROPOFOL;  Surgeon: Regis Bill, MD;  Location: [facility] ENDOSCOPY;  Service: Endoscopy;  Laterality: N/A;   CORONARY STENT INTERVENTION N/A 12/23/2018   Procedure: CORONARY STENT INTERVENTION;  Surgeon: Marcina Millard, MD;  Location: [facility] CV [facility];  Service: Cardiovascular;  Laterality: N/A;   ENDARTERECTOMY Left 01/08/2022   Procedure: ENDARTERECTOMY CAROTID;  Surgeon: Renford Dills, MD;  Location: [facility] ORS;  Service: Vascular;  Laterality: Left;   LEFT HEART CATH AND CORONARY ANGIOGRAPHY Left 12/23/2018   Procedure: LEFT HEART CATH AND CORONARY ANGIOGRAPHY;  Surgeon: Laurier Nancy, MD;  Location: [facility] CV [facility];  Service: Cardiovascular;  Laterality: Left;   LEFT HEART CATH AND CORONARY ANGIOGRAPHY Left 06/25/2020   Procedure: LEFT HEART CATH AND CORONARY ANGIOGRAPHY;  Surgeon: Iran Ouch, MD;  Location: [facility];  Service:  Cardiovascular;  Laterality: Left;   PORT-A-CATH REMOVAL     PORTACATH PLACEMENT      Current Medications: No outpatient medications have been marked as taking for the 05/29/23 encounter (Appointment) with Sondra Barges, PA-C.    Allergies:   Patient has no known allergies.   Social History   Socioeconomic History   Marital status: Married    Spouse name: Not on file   Number of children: Not on file   Years of education: Not on file   Highest  education level: Not on file  Occupational History   Not on file  Tobacco Use   Smoking status: Never   Smokeless tobacco: Never  Vaping Use   Vaping status: Never Used  Substance and Sexual Activity   Alcohol use: Yes    Comment: occ beer   Drug use: No   Sexual activity: Not on file  Other Topics Concern   Not on file  Social History Narrative   Lives at home with wife and daughter.   Social Determinants of Health   Financial Resource Strain: Low Risk  (08/05/2022)   Received from [facility], [facility]   Overall Financial Resource Strain (CARDIA)    Difficulty of Paying Living Expenses: Not hard at all  Food Insecurity: Unknown (08/05/2022)   Received from [facility], [facility]   Hunger Vital Sign    Worried About Running Out of Food in the Last Year: Never true    Ran Out of Food in the Last Year: Not on file  Transportation Needs: Unknown (08/05/2022)   Received from [facility], [facility]   [facility] - Transportation    In the past 12 months, has lack of transportation kept you from medical appointments or from getting medications?: No    Lack of Transportation (Non-Medical): Not on file  Physical Activity: Not on file  Stress: Not on file  Social Connections: Not on file     Family History:  The patient's family history includes CAD in his mother; Cancer in his maternal aunt and mother.  ROS:   12-point review of systems is negative unless otherwise noted in the HPI.   EKGs/Labs/Other Studies Reviewed:    Studies reviewed were summarized above. The additional studies were reviewed today:  2D echo 01/19/2023: 1. Left ventricular ejection fraction, by estimation, is 60 to 65%. The  left ventricle has normal function. The left ventricle has no regional  wall motion abnormalities. There is moderate asymmetric left ventricular  hypertrophy of the  basal-septal  segment. No significant LVOT gradient measured. Left ventricular diastolic  parameters are consistent with Grade I diastolic dysfunction (impaired  relaxation).   2. Right ventricular systolic function is normal. The right ventricular  size is normal.   3. The mitral valve is normal in structure. Mild mitral valve  regurgitation. No evidence of mitral stenosis.   4. The aortic valve is tricuspid. Aortic valve regurgitation is not  visualized. No aortic stenosis is present.   5. There is mild dilatation of the aortic root, measuring 41 mm. There is  mild dilatation of the ascending aorta, measuring 43 mm.   6. The inferior vena cava is normal in size with greater than 50%  respiratory variability, suggesting right atrial pressure of 3 mmHg.  __________   Carotid artery ultrasound 11/06/2022: Summary:  Right Carotid: Velocities in the right ICA are consistent with a 1-39% stenosis.  Left Carotid: Velocities in the left ICA are consistent with a 1-39% stenosis.   Vertebrals: Bilateral vertebral arteries demonstrate antegrade flow.  Subclavians: Normal flow hemodynamics were seen in bilateral subclavian arteries.  __________   [facility] MPI 11/13/2021:   Findings are consistent with no ischemia. The study is low risk.   No ST deviation was noted.   LV perfusion is normal.   Left ventricular function is normal. LVEF is 56%.   Coronary artery calcification noted in the LAD, LCx __________   [facility] 06/25/2020: The left ventricular systolic function is normal. LV end diastolic pressure is normal. The left ventricular ejection fraction is 55-65% by visual estimate. Prox RCA to Mid RCA lesion is 40% stenosed. Prox RCA lesion is 20% stenosed. Prox LAD to Mid LAD lesion is 50% stenosed. Previously placed Ramus stent (unknown type) is widely patent. Previously placed 2nd Mrg-1 stent (unknown type) is widely patent. 2nd Mrg-2 lesion is 30% stenosed.   1.  Patent stents in OM 2  and ramus with no significant restenosis.  Stable moderate proximal LAD and mid RCA disease. 2.  Normal LV systolic function and normal left ventricular end-diastolic pressure.   Recommendations: Continue aggressive medical therapy. __________   [facility] 06/19/2020: Abnormal, potentially high-risk pharmacologic myocardial perfusion stress test. There is a large in size, moderate in severity, nearly completely reversible defect involving the anterior and anteroseptal walls that is concerning for ischemia though an element of artifact cannot be excluded. The left ventricular ejection fraction is normal (60%). Attenuation correction demonstrates coronary stents and mild aortic atherosclerosis. __________   LHC 12/23/2018: Prox LAD lesion is 40% stenosed. Ost 1st Mrg lesion is 95% stenosed. Mid RCA lesion is 40% stenosed. A drug-eluting stent was successfully placed using a STENT RESOLUTE ONYX 2.25X15. Post intervention, there is a 0% residual stenosis.   1.  One-vessel CAD with 95% stenosis proximal OM1 2.  Successful PCI with DES proximal OM1 __________     See Epic for more remote cardiac studies   EKG:  EKG is ordered today.  The EKG ordered today demonstrates ***  Recent Labs: 09/16/2022: Hemoglobin 13.5; Platelets 175  Recent Lipid Panel    Component Value Date/Time   CHOL 122 06/04/2020 1549   TRIG 103 06/04/2020 1549   HDL 43 06/04/2020 1549   CHOLHDL 2.8 06/04/2020 1549   CHOLHDL 5.5 04/14/2016 0623   VLDL 31 04/14/2016 0623   LDLCALC 60 06/04/2020 1549   LDLDIRECT 61 06/04/2020 1549    PHYSICAL EXAM:    VS:  There were no vitals taken for this visit.  BMI: There is no height or weight on file to calculate BMI.  Physical Exam  Wt Readings from Last 3 Encounters:  02/26/23 239 lb 6.4 oz (108.6 kg)  01/08/23 236 lb 8 oz (107.3 kg)  12/19/22 237 lb 8 oz (107.7 kg)     ASSESSMENT & PLAN:   ***   {Are you ordering a CV Procedure (e.g. stress test,  cath, DCCV, TEE, etc)?   Press F2        :[id number]}     Disposition: F/u with Dr. Kirke Corin or an APP in ***.   Medication Adjustments/Labs and Tests Ordered: Current medicines are reviewed at length with the patient today.  Concerns regarding medicines are outlined above. Medication changes, Labs and Tests ordered today are summarized above and listed in the Patient Instructions accessible in Encounters.   SignedEula Listen, PA-C 05/25/2023 4:18 PM     [facility] [address] [place], [place] [address] [phone]

## 2023-05-26 DIAGNOSIS — M9903 Segmental and somatic dysfunction of lumbar region: Secondary | ICD-10-CM | POA: Diagnosis not present

## 2023-05-26 DIAGNOSIS — M9902 Segmental and somatic dysfunction of thoracic region: Secondary | ICD-10-CM | POA: Diagnosis not present

## 2023-05-26 DIAGNOSIS — M9907 Segmental and somatic dysfunction of upper extremity: Secondary | ICD-10-CM | POA: Diagnosis not present

## 2023-05-26 DIAGNOSIS — M9901 Segmental and somatic dysfunction of cervical region: Secondary | ICD-10-CM | POA: Diagnosis not present

## 2023-05-27 DIAGNOSIS — M9902 Segmental and somatic dysfunction of thoracic region: Secondary | ICD-10-CM | POA: Diagnosis not present

## 2023-05-27 DIAGNOSIS — M25539 Pain in unspecified wrist: Secondary | ICD-10-CM | POA: Diagnosis not present

## 2023-05-27 DIAGNOSIS — M9903 Segmental and somatic dysfunction of lumbar region: Secondary | ICD-10-CM | POA: Diagnosis not present

## 2023-05-27 DIAGNOSIS — M9901 Segmental and somatic dysfunction of cervical region: Secondary | ICD-10-CM | POA: Diagnosis not present

## 2023-05-29 ENCOUNTER — Ambulatory Visit: Payer: BC Managed Care – PPO | Admitting: Physician Assistant

## 2023-06-01 DIAGNOSIS — M9901 Segmental and somatic dysfunction of cervical region: Secondary | ICD-10-CM | POA: Diagnosis not present

## 2023-06-01 DIAGNOSIS — M9902 Segmental and somatic dysfunction of thoracic region: Secondary | ICD-10-CM | POA: Diagnosis not present

## 2023-06-01 DIAGNOSIS — M25539 Pain in unspecified wrist: Secondary | ICD-10-CM | POA: Diagnosis not present

## 2023-06-01 DIAGNOSIS — M9903 Segmental and somatic dysfunction of lumbar region: Secondary | ICD-10-CM | POA: Diagnosis not present

## 2023-06-03 DIAGNOSIS — M25539 Pain in unspecified wrist: Secondary | ICD-10-CM | POA: Diagnosis not present

## 2023-06-03 DIAGNOSIS — M9903 Segmental and somatic dysfunction of lumbar region: Secondary | ICD-10-CM | POA: Diagnosis not present

## 2023-06-03 DIAGNOSIS — M9902 Segmental and somatic dysfunction of thoracic region: Secondary | ICD-10-CM | POA: Diagnosis not present

## 2023-06-03 DIAGNOSIS — M9901 Segmental and somatic dysfunction of cervical region: Secondary | ICD-10-CM | POA: Diagnosis not present

## 2023-06-05 DIAGNOSIS — M9902 Segmental and somatic dysfunction of thoracic region: Secondary | ICD-10-CM | POA: Diagnosis not present

## 2023-06-05 DIAGNOSIS — M25539 Pain in unspecified wrist: Secondary | ICD-10-CM | POA: Diagnosis not present

## 2023-06-05 DIAGNOSIS — M9901 Segmental and somatic dysfunction of cervical region: Secondary | ICD-10-CM | POA: Diagnosis not present

## 2023-06-05 DIAGNOSIS — M9903 Segmental and somatic dysfunction of lumbar region: Secondary | ICD-10-CM | POA: Diagnosis not present

## 2023-06-08 DIAGNOSIS — M9902 Segmental and somatic dysfunction of thoracic region: Secondary | ICD-10-CM | POA: Diagnosis not present

## 2023-06-08 DIAGNOSIS — M9901 Segmental and somatic dysfunction of cervical region: Secondary | ICD-10-CM | POA: Diagnosis not present

## 2023-06-08 DIAGNOSIS — M9903 Segmental and somatic dysfunction of lumbar region: Secondary | ICD-10-CM | POA: Diagnosis not present

## 2023-06-08 DIAGNOSIS — M25539 Pain in unspecified wrist: Secondary | ICD-10-CM | POA: Diagnosis not present

## 2023-06-09 DIAGNOSIS — M9903 Segmental and somatic dysfunction of lumbar region: Secondary | ICD-10-CM | POA: Diagnosis not present

## 2023-06-09 DIAGNOSIS — M25539 Pain in unspecified wrist: Secondary | ICD-10-CM | POA: Diagnosis not present

## 2023-06-09 DIAGNOSIS — M9901 Segmental and somatic dysfunction of cervical region: Secondary | ICD-10-CM | POA: Diagnosis not present

## 2023-06-09 DIAGNOSIS — M9902 Segmental and somatic dysfunction of thoracic region: Secondary | ICD-10-CM | POA: Diagnosis not present

## 2023-06-10 DIAGNOSIS — M9902 Segmental and somatic dysfunction of thoracic region: Secondary | ICD-10-CM | POA: Diagnosis not present

## 2023-06-10 DIAGNOSIS — M9903 Segmental and somatic dysfunction of lumbar region: Secondary | ICD-10-CM | POA: Diagnosis not present

## 2023-06-10 DIAGNOSIS — M9901 Segmental and somatic dysfunction of cervical region: Secondary | ICD-10-CM | POA: Diagnosis not present

## 2023-06-10 DIAGNOSIS — M25539 Pain in unspecified wrist: Secondary | ICD-10-CM | POA: Diagnosis not present

## 2023-06-22 DIAGNOSIS — M9902 Segmental and somatic dysfunction of thoracic region: Secondary | ICD-10-CM | POA: Diagnosis not present

## 2023-06-22 DIAGNOSIS — M9903 Segmental and somatic dysfunction of lumbar region: Secondary | ICD-10-CM | POA: Diagnosis not present

## 2023-06-22 DIAGNOSIS — M25539 Pain in unspecified wrist: Secondary | ICD-10-CM | POA: Diagnosis not present

## 2023-06-22 DIAGNOSIS — M9901 Segmental and somatic dysfunction of cervical region: Secondary | ICD-10-CM | POA: Diagnosis not present

## 2023-06-23 ENCOUNTER — Ambulatory Visit
Admission: RE | Admit: 2023-06-23 | Discharge: 2023-06-23 | Disposition: A | Discharge status: Home / Self Care | Payer: BC Managed Care – PPO | Source: Ambulatory Visit | Attending: Physician Assistant | Admitting: Physician Assistant

## 2023-06-23 DIAGNOSIS — R590 Localized enlarged lymph nodes: Secondary | ICD-10-CM | POA: Diagnosis not present

## 2023-06-23 DIAGNOSIS — M25539 Pain in unspecified wrist: Secondary | ICD-10-CM | POA: Diagnosis not present

## 2023-06-23 DIAGNOSIS — I7121 Aneurysm of the ascending aorta, without rupture: Secondary | ICD-10-CM | POA: Diagnosis not present

## 2023-06-23 DIAGNOSIS — M9902 Segmental and somatic dysfunction of thoracic region: Secondary | ICD-10-CM | POA: Diagnosis not present

## 2023-06-23 DIAGNOSIS — M9901 Segmental and somatic dysfunction of cervical region: Secondary | ICD-10-CM | POA: Diagnosis not present

## 2023-06-23 DIAGNOSIS — M9903 Segmental and somatic dysfunction of lumbar region: Secondary | ICD-10-CM | POA: Diagnosis not present

## 2023-06-23 DIAGNOSIS — J8410 Pulmonary fibrosis, unspecified: Secondary | ICD-10-CM | POA: Diagnosis not present

## 2023-06-24 DIAGNOSIS — M9902 Segmental and somatic dysfunction of thoracic region: Secondary | ICD-10-CM | POA: Diagnosis not present

## 2023-06-24 DIAGNOSIS — M9901 Segmental and somatic dysfunction of cervical region: Secondary | ICD-10-CM | POA: Diagnosis not present

## 2023-06-24 DIAGNOSIS — M9903 Segmental and somatic dysfunction of lumbar region: Secondary | ICD-10-CM | POA: Diagnosis not present

## 2023-06-24 DIAGNOSIS — M25539 Pain in unspecified wrist: Secondary | ICD-10-CM | POA: Diagnosis not present

## 2023-06-26 DIAGNOSIS — M9903 Segmental and somatic dysfunction of lumbar region: Secondary | ICD-10-CM | POA: Diagnosis not present

## 2023-06-26 DIAGNOSIS — M25539 Pain in unspecified wrist: Secondary | ICD-10-CM | POA: Diagnosis not present

## 2023-06-26 DIAGNOSIS — M9901 Segmental and somatic dysfunction of cervical region: Secondary | ICD-10-CM | POA: Diagnosis not present

## 2023-06-26 DIAGNOSIS — M9902 Segmental and somatic dysfunction of thoracic region: Secondary | ICD-10-CM | POA: Diagnosis not present

## 2023-06-29 DIAGNOSIS — M9903 Segmental and somatic dysfunction of lumbar region: Secondary | ICD-10-CM | POA: Diagnosis not present

## 2023-06-29 DIAGNOSIS — M25539 Pain in unspecified wrist: Secondary | ICD-10-CM | POA: Diagnosis not present

## 2023-06-29 DIAGNOSIS — M9901 Segmental and somatic dysfunction of cervical region: Secondary | ICD-10-CM | POA: Diagnosis not present

## 2023-06-29 DIAGNOSIS — M9902 Segmental and somatic dysfunction of thoracic region: Secondary | ICD-10-CM | POA: Diagnosis not present

## 2023-06-29 NOTE — Progress Notes (Unsigned)
Cardiology Office Note    Date:  06/30/2023   ID:  Carlos Diaz, DOB 1961/02/17, MRN [id number]  PCP:  Dione Housekeeper, MD  Cardiologist:  Lorine Bears, MD  Electrophysiologist:  None   Chief Complaint: Follow-up  History of Present Illness:   Carlos Diaz is a 62 y.o. male with history of CAD with multiple PCI's as outlined below, ascending thoracic aortic aneurysm measuring 4.1 cm by CTA in 03/2022, HTN, HLD, carotid artery disease status post left sided carotid endarterectomy for asymptomatic high-grade stenosis in 12/2021, anxiety, and depression who presents for follow-up of CAD and ascending thoracic aortic aneurysm.   He was admitted in 2017 with a NSTEMI.  LHC showed high-grade stenosis in the ramus intermedius which was successfully treated with PCI/DES.  He had recurrent angina in 12/2018 with repeat LHC showing a patent ramus stent.  There was high-grade stenosis in a proximal OM branch which was treated with PCI/DES.  He was also noted to have moderate proximal to mid LAD stenosis along with mild RCA disease.  He was seen in 06/2020 with atypical chest pain with subsequent Lexiscan MPI showing evidence of anterior and anteroseptal ischemia with normal EF.  Based on that, he underwent repeat LHC in 06/2020 which demonstrated patent stents in the ramus and OM2 with no significant restenosis.  There was stable moderate proximal LAD and mid RCA disease.  He had a normal LV systolic function and LVEDP.  Medical therapy was recommended.  He was seen in the ED in 09/2021 with chest and abdominal pain as well as elevated BP (felt to be related to wrong BP cuff size).  High-sensitivity troponin negative x2.  CT of the abdomen/pelvis was unrevealing.  He was treated with GI cocktail.  He subsequently underwent Lexiscan MPI in 10/2021 which showed no evidence of ischemia and was overall low risk with a normal LVEF.  Coronary artery calcification was noted in the LAD and LCx.  He had a  screening carotid exam which reportedly demonstrated left-sided "critical stenosis."  Subsequent carotid artery ultrasound on 11/14/2021 demonstrated 1 to 39% RICA stenosis with less than 50% RECA stenosis, 60 to 79% LICA stenosis with less than 50% LECA stenosis, bilateral antegrade flow within the vertebral arteries, and normal flow hemodynamics in the bilateral subclavian arteries.  He was subsequently evaluated by vascular surgery on 11/21/2021 with CTA of the neck on 12/04/2021 demonstrated proximal LICA stenosis of 70 to 80% with mild plaquing in the proximal RICA without hemodynamically significant stenosis with subsequent left-sided CEA in 12/2021.  Incidentally, there was a cluster of nodules in the right upper lobe measuring up to 6 mm that were possibly infectious versus inflammatory with recommendation of follow-up chest CT in 3 months.  Follow-up CT chest in 03/2022 showed a 6 mm anterior right upper lobe pulmonary nodule that was previously seen and stable with cluster of right upper lobe nodularity felt to be sequelae from infectious/inflammatory etiology.  Mediastinal lymphadenopathy was noted with recommendation for follow-up chest CT in 3 months.  A 4.1 ascending thoracic aortic aneurysm was also noted.       Carotid artery ultrasound in 10/2022 showed 1 to 39% bilateral ICA stenosis.   MRI of the brain on 01/06/2023 showed no acute changes with mild chronic marrow diffusion signal hyperintensity in the skull base and apical cervical spine that was nonspecific and unchanged from 2021 favoring a benign/indolent etiology.   To further evaluate his ascending thoracic aortic aneurysm, he underwent echo on  01/19/2023 which showed an EF of 60 to 65%, no regional wall motion abnormalities, moderate asymmetric LVH with no significant LVOT gradient, grade 1 diastolic dysfunction, normal RV systolic function and ventricular cavity size, mild mitral regurgitation, tricuspid aortic valve without evidence of  insufficiency or regurgitation, mild dilatation of the aortic root measuring 41 mm, mild dilatation of the ascending aorta measuring 43 mm, and a normal CVP.  He was last seen in the office on 02/26/2023 and remained without symptoms of angina or cardiac decompensation.  In the setting of elevated BP readings in the office in the 160s over 90s he was transitioned from metoprolol to carvedilol 25 mg twice daily with continuation of Imdur.  He also reported some concerns regarding ongoing statin use with recommendation to undergo a trial of bempedoic acid.  He underwent follow-up chest CT on 06/23/2023, for evaluation of previously noted mediastinal lymphadenopathy on study in 03/2022, which is awaiting radiology overread at this time.  He comes in accompanied by his wife today and is doing well from a cardiac perspective, without symptoms of angina or cardiac decompensation.  No palpitations, presyncope, or syncope.  He notes chronic dizziness and tinnitus.  Reports underlying neurologic comorbidities in the setting of Lyme disease.  Remains active at baseline, working on a regular basis.  However, during his workout earlier today he did note some shortness of breath, though was able to work out yesterday without any issues.  He does wonder if this is related to underlying stress/anxiety for his upcoming appointment.  Does not want to pursue statin therapy or alternative lipid-lowering medications.  Self discontinued bempedoic acid, no off target effect.  Seeing a functional medicine specialist.  Requests evaluation for fatty liver.  Has significantly improved his diet.  No falls or symptoms concerning for bleeding.  Has an upcoming hunting trip to the [facility].   Labs independently reviewed: 09/2022 - Hgb 13.5, PLT 175, potassium 4.0, BUN 17, serum creatinine 1.1, albumin 4.1, AST/ALT normal 08/2022 - TC 99, TG 119, HDL 29, LDL 46 05/2022 - A1c 5.8 07/2020 - TSH normal  Past Medical History:  Diagnosis  Date   Anxiety    Arthritis    Carotid artery disease (HCC)    a.) Carotid doppler 11/14/2021: 1-39% RICA; 60-79% LICA; < 50% CCA and ECA. c.) CTA neck 12/04/2021: 70-80% stenosis of prox LICA; mild plaquing in prox RICA without stenosis.   Coronary artery disease 04/14/2016   a.) LHC 04/14/2016: 95% RI, 50% oOM2-OM2, 50% p-mLAD --> PCI performed placing a 2.25 x 15 mm Xience Alpine DES to RI. b.) LHC 12/23/2018: 95% OM1, 40% pLAD, 40% mRCA --> PCI performed placing a 2.25 x 15 mm Resolute Onxy DES to oOM1. c.) LHC 06/25/2020: EF 55-65%; LVEDP norm; 40% p-mRCA, 20% pRCA, 50% p-mLAD, 30% OM2-2; RI and OM2-1 stents patent; intervention deferred opting for medical mgmt.   Depression    Diastolic dysfunction 04/14/2016   a.) TTE 04/14/2016: EF 65%; mild LA dil, G1DD. b.) stress echo 06/26/2016: EF >55%; triv panvalvular regurg, max workload 13.70 METS. c.) TTE 11/23/2018: EF >%%; mild panvalvular regurg; fibrocalcified AoV without stenosis; G1DD.   GERD (gastroesophageal reflux disease)    Hyperlipidemia    Hypertension    Long term current use of antithrombotics/antiplatelets    a.) on daily DAPT therapy (ASA + clopidogrel)   Lyme disease    NSTEMI (non-ST elevated myocardial infarction) (HCC) 04/13/2016   a.) LHC 04/14/2016 --> 95% RI, 50% oOM2-OM2, 50% p-mLAD --> PCI  performed placing a 2.25 x 15 mm Xience Alpine DES to the RI   Tinnitus    Unstable angina [facility])     Past Surgical History:  Procedure Laterality Date   CARDIAC CATHETERIZATION Right 04/14/2016   Procedure: Left Heart Cath and Coronary Angiography;  Surgeon: Laurier Nancy, MD;  Location: [facility] INVASIVE CV LAB;  Service: Cardiovascular;  Laterality: Right;   CARDIAC CATHETERIZATION N/A 04/14/2016   Procedure: Coronary Stent Intervention;  Surgeon: Alwyn Pea, MD;  Location: [facility] INVASIVE CV LAB;  Service: Cardiovascular;  Laterality: N/A;   COLONOSCOPY     COLONOSCOPY WITH PROPOFOL N/A 03/26/2020   Procedure:  COLONOSCOPY WITH PROPOFOL;  Surgeon: Regis Bill, MD;  Location: [facility] ENDOSCOPY;  Service: Endoscopy;  Laterality: N/A;   CORONARY STENT INTERVENTION N/A 12/23/2018   Procedure: CORONARY STENT INTERVENTION;  Surgeon: Marcina Millard, MD;  Location: [facility] INVASIVE CV LAB;  Service: Cardiovascular;  Laterality: N/A;   ENDARTERECTOMY Left 01/08/2022   Procedure: ENDARTERECTOMY CAROTID;  Surgeon: Renford Dills, MD;  Location: [facility] ORS;  Service: Vascular;  Laterality: Left;   LEFT HEART CATH AND CORONARY ANGIOGRAPHY Left 12/23/2018   Procedure: LEFT HEART CATH AND CORONARY ANGIOGRAPHY;  Surgeon: Laurier Nancy, MD;  Location: [facility] INVASIVE CV LAB;  Service: Cardiovascular;  Laterality: Left;   LEFT HEART CATH AND CORONARY ANGIOGRAPHY Left 06/25/2020   Procedure: LEFT HEART CATH AND CORONARY ANGIOGRAPHY;  Surgeon: Iran Ouch, MD;  Location: [facility] INVASIVE CV LAB;  Service: Cardiovascular;  Laterality: Left;   PORT-A-CATH REMOVAL     PORTACATH PLACEMENT      Current Medications: Current Meds  Medication Sig   carvedilol (COREG) 25 MG tablet Take 1 tablet (25 mg total) by mouth 2 (two) times daily.   citalopram (CELEXA) 40 MG tablet Take 40 mg by mouth at bedtime.   clopidogrel (PLAVIX) 75 MG tablet Take 75 mg by mouth daily.   isosorbide mononitrate (IMDUR) 60 MG 24 hr tablet TAKE 1 TABLET BY MOUTH EVERY DAY   nitroGLYCERIN (NITROSTAT) 0.4 MG SL tablet PLACE 1 TABLET UNDER THE TONGUE EVERY 5 MINUTES AS NEEDED.   pantoprazole (PROTONIX) 40 MG tablet Take 1 tablet (40 mg total) by mouth daily.    Allergies:   Patient has no known allergies.   Social History   Socioeconomic History   Marital status: Married    Spouse name: Not on file   Number of children: Not on file   Years of education: Not on file   Highest education level: Not on file  Occupational History   Not on file  Tobacco Use   Smoking status: Never   Smokeless tobacco: Never  Vaping Use   Vaping  status: Never Used  Substance and Sexual Activity   Alcohol use: Yes    Comment: occ beer   Drug use: No   Sexual activity: Not on file  Other Topics Concern   Not on file  Social History Narrative   Lives at home with wife and daughter.   Social Determinants of Health   Financial Resource Strain: Low Risk  (08/05/2022)   Received from [facility], [facility]   Overall Financial Resource Strain (CARDIA)    Difficulty of Paying Living Expenses: Not hard at all  Food Insecurity: Unknown (08/05/2022)   Received from [facility], [facility]   Hunger Vital Sign    Worried About Running Out of Food in the Last Year: Never  true    Ran Out of Food in the Last Year: Not on file  Transportation Needs: Unknown (08/05/2022)   Received from [facility], [facility]   [facility] - Transportation    In the past 12 months, has lack of transportation kept you from medical appointments or from getting medications?: No    Lack of Transportation (Non-Medical): Not on file  Physical Activity: Not on file  Stress: Not on file  Social Connections: Not on file     Family History:  The patient's family history includes CAD in his mother; Cancer in his maternal aunt and mother.  ROS:   12-point review of systems is negative unless otherwise noted in the HPI.   EKGs/Labs/Other Studies Reviewed:    Studies reviewed were summarized above. The additional studies were reviewed today:  2D echo 01/19/2023: 1. Left ventricular ejection fraction, by estimation, is 60 to 65%. The  left ventricle has normal function. The left ventricle has no regional  wall motion abnormalities. There is moderate asymmetric left ventricular  hypertrophy of the basal-septal  segment. No significant LVOT gradient measured. Left ventricular diastolic  parameters are consistent with Grade I diastolic dysfunction (impaired   relaxation).   2. Right ventricular systolic function is normal. The right ventricular  size is normal.   3. The mitral valve is normal in structure. Mild mitral valve  regurgitation. No evidence of mitral stenosis.   4. The aortic valve is tricuspid. Aortic valve regurgitation is not  visualized. No aortic stenosis is present.   5. There is mild dilatation of the aortic root, measuring 41 mm. There is  mild dilatation of the ascending aorta, measuring 43 mm.   6. The inferior vena cava is normal in size with greater than 50%  respiratory variability, suggesting right atrial pressure of 3 mmHg.  __________   Carotid artery ultrasound 11/06/2022: Summary:  Right Carotid: Velocities in the right ICA are consistent with a 1-39% stenosis.   Left Carotid: Velocities in the left ICA are consistent with a 1-39% stenosis.   Vertebrals: Bilateral vertebral arteries demonstrate antegrade flow.  Subclavians: Normal flow hemodynamics were seen in bilateral subclavian arteries.  __________   Eugenie Birks MPI 11/13/2021:   Findings are consistent with no ischemia. The study is low risk.   No ST deviation was noted.   LV perfusion is normal.   Left ventricular function is normal. LVEF is 56%.   Coronary artery calcification noted in the LAD, LCx __________   [facility] 06/25/2020: The left ventricular systolic function is normal. LV end diastolic pressure is normal. The left ventricular ejection fraction is 55-65% by visual estimate. Prox RCA to Mid RCA lesion is 40% stenosed. Prox RCA lesion is 20% stenosed. Prox LAD to Mid LAD lesion is 50% stenosed. Previously placed Ramus stent (unknown type) is widely patent. Previously placed 2nd Mrg-1 stent (unknown type) is widely patent. 2nd Mrg-2 lesion is 30% stenosed.   1.  Patent stents in OM 2 and ramus with no significant restenosis.  Stable moderate proximal LAD and mid RCA disease. 2.  Normal LV systolic function and normal left ventricular  end-diastolic pressure.   Recommendations: Continue aggressive medical therapy. __________   Eugenie Birks MPI 06/19/2020: Abnormal, potentially high-risk pharmacologic myocardial perfusion stress test. There is a large in size, moderate in severity, nearly completely reversible defect involving the anterior and anteroseptal walls that is concerning for ischemia though an element of artifact cannot be excluded. The left ventricular ejection fraction  is normal (60%). Attenuation correction demonstrates coronary stents and mild aortic atherosclerosis. __________   LHC 12/23/2018: Prox LAD lesion is 40% stenosed. Ost 1st Mrg lesion is 95% stenosed. Mid RCA lesion is 40% stenosed. A drug-eluting stent was successfully placed using a STENT RESOLUTE ONYX 2.25X15. Post intervention, there is a 0% residual stenosis.   1.  One-vessel CAD with 95% stenosis proximal OM1 2.  Successful PCI with DES proximal OM1 __________     See Epic for more remote cardiac studies   EKG:  EKG is ordered today.  The EKG ordered today demonstrates NSR, 61 bpm, acute ST-T changes, baseline wandering and artifact  Recent Labs: 09/16/2022: Hemoglobin 13.5; Platelets 175  Recent Lipid Panel    Component Value Date/Time   CHOL 122 06/04/2020 1549   TRIG 103 06/04/2020 1549   HDL 43 06/04/2020 1549   CHOLHDL 2.8 06/04/2020 1549   CHOLHDL 5.5 04/14/2016 0623   VLDL 31 04/14/2016 0623   LDLCALC 60 06/04/2020 1549   LDLDIRECT 61 06/04/2020 1549    PHYSICAL EXAM:    VS:  BP 128/85 (BP Location: Left Arm, Patient Position: Sitting, Cuff Size: Normal)   Pulse 61   Ht 5\' 11"  (1.803 m)   Wt 226 lb 9.6 oz (102.8 kg)   SpO2 98%   BMI 31.60 kg/m   BMI: Body mass index is 31.6 kg/m.  Physical Exam Vitals reviewed.  Constitutional:      Appearance: He is well-developed.  HENT:     Head: Normocephalic and atraumatic.  Eyes:     General:        Right eye: No discharge.        Left eye: No discharge.  Neck:      Vascular: No JVD.  Cardiovascular:     Rate and Rhythm: Normal rate and regular rhythm.     Pulses:          Posterior tibial pulses are 2+ on the right side and 2+ on the left side.     Heart sounds: Normal heart sounds, S1 normal and S2 normal. Heart sounds not distant. No midsystolic click and no opening snap. No murmur heard.    No friction rub.  Pulmonary:     Effort: Pulmonary effort is normal. No respiratory distress.     Breath sounds: Normal breath sounds. No decreased breath sounds, wheezing, rhonchi or rales.  Chest:     Chest wall: No tenderness.  Abdominal:     General: There is no distension.  Musculoskeletal:     Cervical back: Normal range of motion.     Right lower leg: No edema.     Left lower leg: No edema.  Skin:    General: Skin is warm and dry.     Nails: There is no clubbing.  Neurological:     Mental Status: He is alert and oriented to person, place, and time.  Psychiatric:        Speech: Speech normal.        Behavior: Behavior normal.        Thought Content: Thought content normal.        Judgment: Judgment normal.     Wt Readings from Last 3 Encounters:  06/30/23 226 lb 9.6 oz (102.8 kg)  02/26/23 239 lb 6.4 oz (108.6 kg)  01/08/23 236 lb 8 oz (107.3 kg)     ASSESSMENT & PLAN:   CAD involving the native coronary arteries without angina: He is doing well and without  symptoms concerning for angina or cardiac decompensation.  Remains active at baseline.  Continue aggressive risk factor modification and secondary prevention including couple ago, carvedilol, and Imdur.  Self discontinued statin and bempedoic acid as outlined below.  Aware of cardiovascular risk.  Pursuing functional medicine and lifestyle modification.  No indication for further ischemic testing at this time.  Ascending thoracic aortic aneurysm: Noted on CT in 03/2022 measuring 4.1 cm.  CT chest pending.  Continue with optimal blood pressure control.  HTN: Blood pressure is  well-controlled in the office today.  He remains on carvedilol 25 mg twice daily as well as Imdur 60 mg.  HLD: LDL 46 in 08/2022.  Obtain fasting lipid panel and LFT.  Declines statin and nonstatin lipid-lowering therapies.  Requests right upper quadrant ultrasound to evaluate for fatty liver for further risk stratification, order placed (this may be able to be further evaluated on pending CT of the chest if the entire liver was visualized).  Continue with heart healthy diet.  Carotid artery disease: Status post left-sided CEA.  Most recent carotid artery ultrasound from 10/2022 demonstrated 1 to 39% bilateral ICA stenosis.  Remains on clopidogrel.  No longer on statin as outlined above.  Pulmonary nodule/borderline mediastinal lymphadenopathy: Noted on CT scan in 03/2022.  Follow-up CT pending.    Disposition: F/u with Dr. Kirke Corin or an APP in 6 months.   Medication Adjustments/Labs and Tests Ordered: Current medicines are reviewed at length with the patient today.  Concerns regarding medicines are outlined above. Medication changes, Labs and Tests ordered today are summarized above and listed in the Patient Instructions accessible in Encounters.   Signed, Eula Listen, PA-C 06/30/2023 5:01 PM     [facility] -  [address] [place], [place] [address] [phone]

## 2023-06-30 ENCOUNTER — Ambulatory Visit: Payer: BC Managed Care – PPO | Attending: Physician Assistant | Admitting: Physician Assistant

## 2023-06-30 ENCOUNTER — Encounter: Payer: Self-pay | Admitting: Physician Assistant

## 2023-06-30 VITALS — BP 128/85 | HR 61 | Ht 71.0 in | Wt 226.6 lb

## 2023-06-30 DIAGNOSIS — I10 Essential (primary) hypertension: Secondary | ICD-10-CM | POA: Diagnosis not present

## 2023-06-30 DIAGNOSIS — Z79899 Other long term (current) drug therapy: Secondary | ICD-10-CM

## 2023-06-30 DIAGNOSIS — I6522 Occlusion and stenosis of left carotid artery: Secondary | ICD-10-CM

## 2023-06-30 DIAGNOSIS — I7121 Aneurysm of the ascending aorta, without rupture: Secondary | ICD-10-CM | POA: Diagnosis not present

## 2023-06-30 DIAGNOSIS — I2510 Atherosclerotic heart disease of native coronary artery without angina pectoris: Secondary | ICD-10-CM

## 2023-06-30 DIAGNOSIS — M25539 Pain in unspecified wrist: Secondary | ICD-10-CM | POA: Diagnosis not present

## 2023-06-30 DIAGNOSIS — M9903 Segmental and somatic dysfunction of lumbar region: Secondary | ICD-10-CM | POA: Diagnosis not present

## 2023-06-30 DIAGNOSIS — M9902 Segmental and somatic dysfunction of thoracic region: Secondary | ICD-10-CM | POA: Diagnosis not present

## 2023-06-30 DIAGNOSIS — R911 Solitary pulmonary nodule: Secondary | ICD-10-CM

## 2023-06-30 DIAGNOSIS — E785 Hyperlipidemia, unspecified: Secondary | ICD-10-CM

## 2023-06-30 DIAGNOSIS — M9901 Segmental and somatic dysfunction of cervical region: Secondary | ICD-10-CM | POA: Diagnosis not present

## 2023-06-30 NOTE — Patient Instructions (Addendum)
Medication Instructions:  Your Physician recommend you continue on your current medication as directed.    *If you need a refill on your cardiac medications before your next appointment, please call your pharmacy*   Lab Work: Your provider would like for you to return in 1 day  to have the following labs drawn: Lipid Panel, and Liver function test.   Please go to [facility] [address] ([facility] [address], [place] [address] You do not need an appointment.  They are open from 7:30 am-4 pm.  Lunch from 1:00 pm- 2:00 pm You DO need to be fasting.    If you have labs (blood work) drawn today and your tests are completely normal, you will receive your results only by: MyChart Message (if you have MyChart) OR A paper copy in the mail If you have any lab test that is abnormal or we need to change your treatment, we will call you to review the results.   Testing/Procedures: Your physician has requested that you have a ultrasound of the right upper quadrant This will be done in the medical mall and they should call you to schedule this exam.  Follow-Up: At [facility], you and your health needs are our priority.  As part of our continuing mission to provide you with exceptional heart care, we have created designated Provider Care Teams.  These Care Teams include your primary Cardiologist (physician) and Advanced Practice Providers (APPs -  Physician Assistants and Nurse Practitioners) who all work together to provide you with the care you need, when you need it.  We recommend signing up for the patient portal called "MyChart".  Sign up information is provided on this After Visit Summary.  MyChart is used to connect with patients for Virtual Visits (Telemedicine).  Patients are able to view lab/test results, encounter notes, upcoming appointments, etc.  Non-urgent messages can be sent to your provider as well.   To learn more about what you can do with MyChart,  go to [email or web address].    Your next appointment:   6 month(s)  Provider:   You may see Lorine Bears, MD or one of the following Advanced Practice Providers on your designated Care Team:   Eula Listen, [place]

## 2023-07-01 ENCOUNTER — Other Ambulatory Visit: Payer: Self-pay | Admitting: *Deleted

## 2023-07-01 ENCOUNTER — Ambulatory Visit
Admission: RE | Admit: 2023-07-01 | Discharge: 2023-07-01 | Disposition: A | Discharge status: Home / Self Care | Payer: BC Managed Care – PPO | Source: Ambulatory Visit | Attending: Physician Assistant | Admitting: Physician Assistant

## 2023-07-01 ENCOUNTER — Other Ambulatory Visit
Admission: RE | Admit: 2023-07-01 | Discharge: 2023-07-01 | Disposition: A | Discharge status: Home / Self Care | Payer: BC Managed Care – PPO | Source: Ambulatory Visit | Attending: Physician Assistant | Admitting: Physician Assistant

## 2023-07-01 DIAGNOSIS — E785 Hyperlipidemia, unspecified: Secondary | ICD-10-CM | POA: Insufficient documentation

## 2023-07-01 DIAGNOSIS — M9903 Segmental and somatic dysfunction of lumbar region: Secondary | ICD-10-CM | POA: Diagnosis not present

## 2023-07-01 DIAGNOSIS — M9902 Segmental and somatic dysfunction of thoracic region: Secondary | ICD-10-CM | POA: Diagnosis not present

## 2023-07-01 DIAGNOSIS — I2510 Atherosclerotic heart disease of native coronary artery without angina pectoris: Secondary | ICD-10-CM | POA: Insufficient documentation

## 2023-07-01 DIAGNOSIS — M9901 Segmental and somatic dysfunction of cervical region: Secondary | ICD-10-CM | POA: Diagnosis not present

## 2023-07-01 DIAGNOSIS — M25539 Pain in unspecified wrist: Secondary | ICD-10-CM | POA: Diagnosis not present

## 2023-07-01 LAB — LIPID PANEL
Cholesterol: 273 mg/dL — ABNORMAL HIGH (ref 0–200)
HDL: 41 mg/dL (ref >40)
LDL Cholesterol: 210 mg/dL — ABNORMAL HIGH (ref 0–99)
Total CHOL/HDL Ratio: 6.7 {ratio}
Triglycerides: 110 mg/dL (ref <150)
VLDL: 22 mg/dL (ref 0–40)

## 2023-07-01 LAB — HEPATIC FUNCTION PANEL
ALT: 13 U/L (ref 0–44)
AST: 15 U/L (ref 15–41)
Albumin: 4.3 g/dL (ref 3.5–5.0)
Alkaline Phosphatase: 37 U/L — ABNORMAL LOW (ref 38–126)
Bilirubin, Direct: <0.1 mg/dL (ref 0.0–0.2)
Total Bilirubin: 1 mg/dL (ref 0.3–1.2)
Total Protein: 7.5 g/dL (ref 6.5–8.1)

## 2023-07-02 ENCOUNTER — Other Ambulatory Visit: Payer: Self-pay | Admitting: Cardiovascular Disease

## 2023-07-17 ENCOUNTER — Other Ambulatory Visit: Payer: Self-pay | Admitting: Emergency Medicine

## 2023-07-17 DIAGNOSIS — I7121 Aneurysm of the ascending aorta, without rupture: Secondary | ICD-10-CM

## 2023-07-21 DIAGNOSIS — M9903 Segmental and somatic dysfunction of lumbar region: Secondary | ICD-10-CM | POA: Diagnosis not present

## 2023-07-21 DIAGNOSIS — M25539 Pain in unspecified wrist: Secondary | ICD-10-CM | POA: Diagnosis not present

## 2023-07-21 DIAGNOSIS — M9901 Segmental and somatic dysfunction of cervical region: Secondary | ICD-10-CM | POA: Diagnosis not present

## 2023-07-21 DIAGNOSIS — M9902 Segmental and somatic dysfunction of thoracic region: Secondary | ICD-10-CM | POA: Diagnosis not present

## 2023-08-03 ENCOUNTER — Ambulatory Visit
Admission: RE | Admit: 2023-08-03 | Discharge: 2023-08-03 | Disposition: A | Discharge status: Home / Self Care | Payer: BC Managed Care – PPO | Source: Ambulatory Visit | Attending: Physician Assistant | Admitting: Physician Assistant

## 2023-08-03 DIAGNOSIS — I7121 Aneurysm of the ascending aorta, without rupture: Secondary | ICD-10-CM | POA: Insufficient documentation

## 2023-08-03 MED ORDER — IOHEXOL 350 MG/ML SOLN
75.0000 mL | Freq: Once | INTRAVENOUS | Status: AC | PRN
  Administered 2023-08-03: 75 mL via INTRAVENOUS

## 2023-08-06 ENCOUNTER — Telehealth: Payer: Self-pay | Admitting: Cardiovascular Disease

## 2023-08-06 NOTE — Telephone Encounter (Signed)
Patient would like to make sure 12/02 CT with dye results will be available prior to appointment tomorrow, 12/06 with Eula Listen, PA. Please advise.

## 2023-08-06 NOTE — Progress Notes (Unsigned)
Cardiology Office Note    Date:  08/06/2023   ID:  Mya Caserta, DOB 12/08/1960, MRN [id number]  PCP:  Dione Housekeeper, MD  Cardiologist:  Lorine Bears, MD  Electrophysiologist:  None   Chief Complaint: Follow up  History of Present Illness:   Quintez Sobieck is a 62 y.o. male with history of CAD with multiple PCI's as outlined below, ascending thoracic aortic aneurysm measuring 4.1 cm by CTA in 03/2022, HTN, HLD, carotid artery disease status post left sided carotid endarterectomy for asymptomatic high-grade stenosis in 12/2021, anxiety, and depression who presents for follow-up of CAD and ascending thoracic aortic aneurysm.   He was admitted in 2017 with a NSTEMI.  LHC showed high-grade stenosis in the ramus intermedius which was successfully treated with PCI/DES.  He had recurrent angina in 12/2018 with repeat LHC showing a patent ramus stent.  There was high-grade stenosis in a proximal OM branch which was treated with PCI/DES.  He was also noted to have moderate proximal to mid LAD stenosis along with mild RCA disease.  He was seen in 06/2020 with atypical chest pain with subsequent Lexiscan MPI showing evidence of anterior and anteroseptal ischemia with normal EF.  Based on that, he underwent repeat LHC in 06/2020 which demonstrated patent stents in the ramus and OM2 with no significant restenosis.  There was stable moderate proximal LAD and mid RCA disease.  He had a normal LV systolic function and LVEDP.  Medical therapy was recommended.  He was seen in the ED in 09/2021 with chest and abdominal pain as well as elevated BP (felt to be related to wrong BP cuff size).  High-sensitivity troponin negative x2.  CT of the abdomen/pelvis was unrevealing.  He was treated with GI cocktail.  He subsequently underwent Lexiscan MPI in 10/2021 which showed no evidence of ischemia and was overall low risk with a normal LVEF.  Coronary artery calcification was noted in the LAD and LCx.  He had a screening  carotid exam which reportedly demonstrated left-sided "critical stenosis."  Subsequent carotid artery ultrasound on 11/14/2021 demonstrated 1 to 39% RICA stenosis with less than 50% RECA stenosis, 60 to 79% LICA stenosis with less than 50% LECA stenosis, bilateral antegrade flow within the vertebral arteries, and normal flow hemodynamics in the bilateral subclavian arteries.  He was subsequently evaluated by vascular surgery on 11/21/2021 with CTA of the neck on 12/04/2021 demonstrated proximal LICA stenosis of 70 to 80% with mild plaquing in the proximal RICA without hemodynamically significant stenosis with subsequent left-sided CEA in 12/2021.  Incidentally, there was a cluster of nodules in the right upper lobe measuring up to 6 mm that were possibly infectious versus inflammatory with recommendation of follow-up chest CT in 3 months.  Follow-up CT chest in 03/2022 showed a 6 mm anterior right upper lobe pulmonary nodule that was previously seen and stable with cluster of right upper lobe nodularity felt to be sequelae from infectious/inflammatory etiology.  Mediastinal lymphadenopathy was noted with recommendation for follow-up chest CT in 3 months.  A 4.1 ascending thoracic aortic aneurysm was also noted.       Carotid artery ultrasound in 10/2022 showed 1 to 39% bilateral ICA stenosis.   MRI of the brain on 01/06/2023 showed no acute changes with mild chronic marrow diffusion signal hyperintensity in the skull base and apical cervical spine that was nonspecific and unchanged from 2021 favoring a benign/indolent etiology.   To further evaluate his ascending thoracic aortic aneurysm, he underwent echo  on 01/19/2023 which showed an EF of 60 to 65%, no regional wall motion abnormalities, moderate asymmetric LVH with no significant LVOT gradient, grade 1 diastolic dysfunction, normal RV systolic function and ventricular cavity size, mild mitral regurgitation, tricuspid aortic valve without evidence of insufficiency or  regurgitation, mild dilatation of the aortic root measuring 41 mm, mild dilatation of the ascending aorta measuring 43 mm, and a normal CVP.   He was seen in the office on 02/26/2023 and remained without symptoms of angina or cardiac decompensation.  In the setting of elevated BP readings in the office in the 160s over 90s he was transitioned from metoprolol to carvedilol 25 mg twice daily with continuation of Imdur.  He also reported some concerns regarding ongoing statin use with recommendation to undergo a trial of bempedoic acid.   He underwent follow-up chest CT on 06/23/2023, for evaluation of previously noted mediastinal lymphadenopathy on study in 03/2022, with over read completed on 07/16/2023 showing calcified right upper lobe granuloma with no suspicious pulmonary nodules along with age advanced three-vessel coronary artery calcification and a stable 4.1 ascending aortic aneurysm along with aortic atherosclerosis.  He was last seen in the office on 06/30/2023 and was without symptoms of angina or cardiac decompensation.  He remained active at baseline.  He did not want to pursue statin therapy or alternative lipid-lowering medications and had self discontinued bempedoic acid without off target effects.  He was seeing a functional medicine specialist.  He requested ultrasound for fatty liver which was unrevealing.  Follow-up labs showed poor control of cholesterol.  CTA chest/aorta performed on 08/03/2023 is awaiting over read.  ***   Labs independently reviewed: 06/2023 - albumin 4.3, AST/ALT normal, TC 273, TG 110, HDL 41, LDL 210 09/2022 - Hgb 13.5, PLT 175, potassium 4.0, BUN 17, serum creatinine 1.1 08/2022 - TC 99, TG 119, HDL 29, LDL 46 05/2022 - A1c 5.8 07/2020 - TSH normal  Past Medical History:  Diagnosis Date   Anxiety    Arthritis    Carotid artery disease (HCC)    a.) Carotid doppler 11/14/2021: 1-39% RICA; 60-79% LICA; < 50% CCA and ECA. c.) CTA neck 12/04/2021: 70-80%  stenosis of prox LICA; mild plaquing in prox RICA without stenosis.   Coronary artery disease 04/14/2016   a.) LHC 04/14/2016: 95% RI, 50% oOM2-OM2, 50% p-mLAD --> PCI performed placing a 2.25 x 15 mm Xience Alpine DES to RI. b.) LHC 12/23/2018: 95% OM1, 40% pLAD, 40% mRCA --> PCI performed placing a 2.25 x 15 mm Resolute Onxy DES to oOM1. c.) LHC 06/25/2020: EF 55-65%; LVEDP norm; 40% p-mRCA, 20% pRCA, 50% p-mLAD, 30% OM2-2; RI and OM2-1 stents patent; intervention deferred opting for medical mgmt.   Depression    Diastolic dysfunction 04/14/2016   a.) TTE 04/14/2016: EF 65%; mild LA dil, G1DD. b.) stress echo 06/26/2016: EF >55%; triv panvalvular regurg, max workload 13.70 METS. c.) TTE 11/23/2018: EF >%%; mild panvalvular regurg; fibrocalcified AoV without stenosis; G1DD.   GERD (gastroesophageal reflux disease)    Hyperlipidemia    Hypertension    Long term current use of antithrombotics/antiplatelets    a.) on daily DAPT therapy (ASA + clopidogrel)   Lyme disease    NSTEMI (non-ST elevated myocardial infarction) (HCC) 04/13/2016   a.) LHC 04/14/2016 --> 95% RI, 50% oOM2-OM2, 50% p-mLAD --> PCI performed placing a 2.25 x 15 mm Xience Alpine DES to the RI   Tinnitus    Unstable angina (HCC)     Past  Surgical History:  Procedure Laterality Date   CARDIAC CATHETERIZATION Right 04/14/2016   Procedure: Left Heart Cath and Coronary Angiography;  Surgeon: Laurier Nancy, MD;  Location: [facility];  Service: Cardiovascular;  Laterality: Right;   CARDIAC CATHETERIZATION N/A 04/14/2016   Procedure: Coronary Stent Intervention;  Surgeon: Alwyn Pea, MD;  Location: [facility];  Service: Cardiovascular;  Laterality: N/A;   COLONOSCOPY     COLONOSCOPY WITH PROPOFOL N/A 03/26/2020   Procedure: COLONOSCOPY WITH PROPOFOL;  Surgeon: Regis Bill, MD;  Location: [facility];  Service: Endoscopy;  Laterality: N/A;   CORONARY STENT INTERVENTION N/A 12/23/2018    Procedure: CORONARY STENT INTERVENTION;  Surgeon: Marcina Millard, MD;  Location: [facility];  Service: Cardiovascular;  Laterality: N/A;   ENDARTERECTOMY Left 01/08/2022   Procedure: ENDARTERECTOMY CAROTID;  Surgeon: Renford Dills, MD;  Location: [facility];  Service: Vascular;  Laterality: Left;   LEFT HEART CATH AND CORONARY ANGIOGRAPHY Left 12/23/2018   Procedure: LEFT HEART CATH AND CORONARY ANGIOGRAPHY;  Surgeon: Laurier Nancy, MD;  Location: [facility];  Service: Cardiovascular;  Laterality: Left;   LEFT HEART CATH AND CORONARY ANGIOGRAPHY Left 06/25/2020   Procedure: LEFT HEART CATH AND CORONARY ANGIOGRAPHY;  Surgeon: Iran Ouch, MD;  Location: [facility];  Service: Cardiovascular;  Laterality: Left;   PORT-A-CATH REMOVAL     PORTACATH PLACEMENT      Current Medications: No outpatient medications have been marked as taking for the 08/07/23 encounter (Appointment) with Sondra Barges, PA-C.    Allergies:   Patient has no known allergies.   Social History   Socioeconomic History   Marital status: Married    Spouse name: Not on file   Number of children: Not on file   Years of education: Not on file   Highest education level: Not on file  Occupational History   Not on file  Tobacco Use   Smoking status: Never   Smokeless tobacco: Never  Vaping Use   Vaping status: Never Used  Substance and Sexual Activity   Alcohol use: Yes    Comment: occ beer   Drug use: No   Sexual activity: Not on file  Other Topics Concern   Not on file  Social History Narrative   Lives at home with wife and daughter.   Social Determinants of Health   Financial Resource Strain: Low Risk  (08/05/2022)   Received from [facility], [facility]   Overall Financial Resource Strain (CARDIA)    Difficulty of Paying Living Expenses: Not hard at all  Food Insecurity: Unknown (08/05/2022)   Received from [facility], [facility]   Hunger Vital Sign    Worried About Running Out of Food in the Last Year: Never true    Ran Out of Food in the Last Year: Not on file  Transportation Needs: Unknown (08/05/2022)   Received from [facility], [facility]   [facility] - Transportation    In the past 12 months, has lack of transportation kept you from medical appointments or from getting medications?: No    Lack of Transportation (Non-Medical): Not on file  Physical Activity: Not on file  Stress: Not on file  Social Connections: Not on file     Family History:  The patient's family history includes CAD in his mother; Cancer in his maternal aunt and mother.  ROS:  12-point review of systems is negative unless otherwise noted in the HPI.   EKGs/Labs/Other Studies Reviewed:    Studies reviewed were summarized above. The additional studies were reviewed today:  CTA chest/aorta 08/03/2023: *** __________  2D echo 01/19/2023: 1. Left ventricular ejection fraction, by estimation, is 60 to 65%. The  left ventricle has normal function. The left ventricle has no regional  wall motion abnormalities. There is moderate asymmetric left ventricular  hypertrophy of the basal-septal  segment. No significant LVOT gradient measured. Left ventricular diastolic  parameters are consistent with Grade I diastolic dysfunction (impaired  relaxation).   2. Right ventricular systolic function is normal. The right ventricular  size is normal.   3. The mitral valve is normal in structure. Mild mitral valve  regurgitation. No evidence of mitral stenosis.   4. The aortic valve is tricuspid. Aortic valve regurgitation is not  visualized. No aortic stenosis is present.   5. There is mild dilatation of the aortic root, measuring 41 mm. There is  mild dilatation of the ascending aorta, measuring 43 mm.   6. The inferior vena cava is normal in size with greater than 50%   respiratory variability, suggesting right atrial pressure of 3 mmHg.  __________   Carotid artery ultrasound 11/06/2022: Summary:  Right Carotid: Velocities in the right ICA are consistent with a 1-39% stenosis.   Left Carotid: Velocities in the left ICA are consistent with a 1-39% stenosis.   Vertebrals: Bilateral vertebral arteries demonstrate antegrade flow.  Subclavians: Normal flow hemodynamics were seen in bilateral subclavian arteries.  __________   Eugenie Birks MPI 11/13/2021:   Findings are consistent with no ischemia. The study is low risk.   No ST deviation was noted.   LV perfusion is normal.   Left ventricular function is normal. LVEF is 56%.   Coronary artery calcification noted in the LAD, LCx __________   [facility] 06/25/2020: The left ventricular systolic function is normal. LV end diastolic pressure is normal. The left ventricular ejection fraction is 55-65% by visual estimate. Prox RCA to Mid RCA lesion is 40% stenosed. Prox RCA lesion is 20% stenosed. Prox LAD to Mid LAD lesion is 50% stenosed. Previously placed Ramus stent (unknown type) is widely patent. Previously placed 2nd Mrg-1 stent (unknown type) is widely patent. 2nd Mrg-2 lesion is 30% stenosed.   1.  Patent stents in OM 2 and ramus with no significant restenosis.  Stable moderate proximal LAD and mid RCA disease. 2.  Normal LV systolic function and normal left ventricular end-diastolic pressure.   Recommendations: Continue aggressive medical therapy. __________   Eugenie Birks MPI 06/19/2020: Abnormal, potentially high-risk pharmacologic myocardial perfusion stress test. There is a large in size, moderate in severity, nearly completely reversible defect involving the anterior and anteroseptal walls that is concerning for ischemia though an element of artifact cannot be excluded. The left ventricular ejection fraction is normal (60%). Attenuation correction demonstrates coronary stents and mild aortic  atherosclerosis. __________   LHC 12/23/2018: Prox LAD lesion is 40% stenosed. Ost 1st Mrg lesion is 95% stenosed. Mid RCA lesion is 40% stenosed. A drug-eluting stent was successfully placed using a STENT RESOLUTE ONYX 2.25X15. Post intervention, there is a 0% residual stenosis.   1.  One-vessel CAD with 95% stenosis proximal OM1 2.  Successful PCI with DES proximal OM1 __________     See Epic for more remote cardiac studies   EKG:  EKG is ordered today.  The EKG ordered today demonstrates ***  Recent Labs: 09/16/2022: Hemoglobin  13.5; Platelets 175 07/01/2023: ALT 13  Recent Lipid Panel    Component Value Date/Time   CHOL 273 (H) 07/01/2023 0830   CHOL 122 06/04/2020 1549   TRIG 110 07/01/2023 0830   HDL 41 07/01/2023 0830   HDL 43 06/04/2020 1549   CHOLHDL 6.7 07/01/2023 0830   VLDL 22 07/01/2023 0830   LDLCALC 210 (H) 07/01/2023 0830   LDLCALC 60 06/04/2020 1549   LDLDIRECT 61 06/04/2020 1549    PHYSICAL EXAM:    VS:  There were no vitals taken for this visit.  BMI: There is no height or weight on file to calculate BMI.  Physical Exam  Wt Readings from Last 3 Encounters:  06/30/23 226 lb 9.6 oz (102.8 kg)  02/26/23 239 lb 6.4 oz (108.6 kg)  01/08/23 236 lb 8 oz (107.3 kg)     ASSESSMENT & PLAN:   CAD involving the native coronary arteries without angina:   Ascending thoracic aortic aneurysm: Noted on CT in 03/2022 measuring 4.1 cm.  CT chest in 06/2023 showed a stable 4.1 cm ascending aortic aneurysm.  HTN: Blood pressure   HLD: LDL has trended from 46 to 210 off lipid-lowering therapy.  Carotid artery disease: Status post left-sided CEA. Most recent carotid artery ultrasound from 10/2022 demonstrated 1 to 39% bilateral ICA stenosis.   Pulmonary nodule/borderline mediastinal lymphadenopathy: Noted on CT scan in 03/2022.  Follow-up CT in 06/2023 showed no suspicious pulmonary nodules or pathologically enlarged lymph nodes.   {Are you ordering a CV  Procedure (e.g. stress test, cath, DCCV, TEE, etc)?   Press F2        :[id number]}     Disposition: F/u with Dr. Kirke Corin or an APP in ***.   Medication Adjustments/Labs and Tests Ordered: Current medicines are reviewed at length with the patient today.  Concerns regarding medicines are outlined above. Medication changes, Labs and Tests ordered today are summarized above and listed in the Patient Instructions accessible in Encounters.   Signed, Eula Listen, PA-C 08/06/2023 10:15 AM     [facility] [place] [address] [place], [place] [address] [phone]

## 2023-08-06 NOTE — Telephone Encounter (Signed)
The patient has been called and all questions were answered.  Pt reminded of upcoming appt tomorrow and encouraged to research cholesterol lowering treatments.

## 2023-08-07 ENCOUNTER — Ambulatory Visit: Payer: BC Managed Care – PPO | Admitting: Physician Assistant

## 2023-08-07 ENCOUNTER — Telehealth: Payer: Self-pay | Admitting: Emergency Medicine

## 2023-08-07 NOTE — Telephone Encounter (Signed)
The patient has been notified of the appt change. Pt verbalized understanding. All questions (if any) were answered.  Pt reminded of upcoming appt and encouraged to call if any concerns or questions arise  Letter sent

## 2023-08-11 NOTE — Addendum Note (Signed)
Addended by: Ursula Alert on: 08/11/2023 12:16 PM   Modules accepted: Orders

## 2023-08-11 NOTE — Addendum Note (Signed)
Addended by: Ursula Alert on: 08/11/2023 05:25 PM   Modules accepted: Orders

## 2023-08-31 ENCOUNTER — Other Ambulatory Visit: Payer: Self-pay | Admitting: Cardiovascular Disease

## 2023-09-14 NOTE — Progress Notes (Signed)
 Cardiology Office Note    Date:  09/15/2023   ID:  Carlos Diaz, DOB [birth year], MRN [id number]  PCP:  Eliverto Bette Hover, MD  Cardiologist:  Deatrice Cage, MD  Electrophysiologist:  None   Chief Complaint: Follow-up   History of Present Illness:   Carlos Diaz is a 63 y.o. male with history of CAD with multiple PCI's as outlined below, ascending thoracic aortic aneurysm measuring 4.1 cm by CTA in 03/2022, HTN, HLD, carotid artery disease status post left sided carotid endarterectomy for asymptomatic high-grade stenosis in 12/2021, anxiety, and depression who presents for follow-up of CAD and ascending thoracic aortic aneurysm.   He was admitted in 2017 with a NSTEMI.  LHC showed high-grade stenosis in the ramus intermedius which was successfully treated with PCI/DES.  He had recurrent angina in 12/2018 with repeat LHC showing a patent ramus stent.  There was high-grade stenosis in a proximal OM branch which was treated with PCI/DES.  He was also noted to have moderate proximal to mid LAD stenosis along with mild RCA disease.  He was seen in 06/2020 with atypical chest pain with subsequent Lexiscan  MPI showing evidence of anterior and anteroseptal ischemia with normal EF.  Based on that, he underwent repeat LHC in 06/2020 which demonstrated patent stents in the ramus and OM2 with no significant restenosis.  There was stable moderate proximal LAD and mid RCA disease.  He had a normal LV systolic function and LVEDP.  Medical therapy was recommended.  He was seen in the ED in 09/2021 with chest and abdominal pain as well as elevated BP (felt to be related to wrong BP cuff size).  High-sensitivity troponin negative x2.  CT of the abdomen/pelvis was unrevealing.  He was treated with GI cocktail.  He subsequently underwent Lexiscan  MPI in 10/2021 which showed no evidence of ischemia and was overall low risk with a normal LVEF.  Coronary artery calcification was noted in the LAD and LCx.  He had a  screening carotid exam which reportedly demonstrated left-sided critical stenosis.  Subsequent carotid artery ultrasound on 11/14/2021 demonstrated 1 to 39% RICA stenosis with less than 50% RECA stenosis, 60 to 79% LICA stenosis with less than 50% LECA stenosis, bilateral antegrade flow within the vertebral arteries, and normal flow hemodynamics in the bilateral subclavian arteries.  He was subsequently evaluated by vascular surgery on 11/21/2021 with CTA of the neck on 12/04/2021 demonstrated proximal LICA stenosis of 70 to 80% with mild plaquing in the proximal RICA without hemodynamically significant stenosis with subsequent left-sided CEA in 12/2021.  Incidentally, there was a cluster of nodules in the right upper lobe measuring up to 6 mm that were possibly infectious versus inflammatory with recommendation of follow-up chest CT in 3 months.  Follow-up CT chest in 03/2022 showed a 6 mm anterior right upper lobe pulmonary nodule that was previously seen and stable with cluster of right upper lobe nodularity felt to be sequelae from infectious/inflammatory etiology.  Mediastinal lymphadenopathy was noted with recommendation for follow-up chest CT in 3 months.  A 4.1 ascending thoracic aortic aneurysm was also noted.       Carotid artery ultrasound in 10/2022 showed 1 to 39% bilateral ICA stenosis.   MRI of the brain on 01/06/2023 showed no acute changes with mild chronic marrow diffusion signal hyperintensity in the skull base and apical cervical spine that was nonspecific and unchanged from 2021 favoring a benign/indolent etiology.   To further evaluate his ascending thoracic aortic aneurysm, he underwent echo  on 01/19/2023 which showed an EF of 60 to 65%, no regional wall motion abnormalities, moderate asymmetric LVH with no significant LVOT gradient, grade 1 diastolic dysfunction, normal RV systolic function and ventricular cavity size, mild mitral regurgitation, tricuspid aortic valve without evidence of  insufficiency or regurgitation, mild dilatation of the aortic root measuring 41 mm, mild dilatation of the ascending aorta measuring 43 mm, and a normal CVP.   He was seen in the office on 02/26/2023 and in the setting of elevated BP readings in the office in the 160s over 90s, he was transitioned from metoprolol  to carvedilol  25 mg twice daily with continuation of Imdur .  He also reported some concerns regarding ongoing statin use with recommendation to undergo a trial of bempedoic acid .   He underwent follow-up chest CT on 06/23/2023, for evaluation of previously noted mediastinal lymphadenopathy on study in 03/2022, which showed stable 4.1 cm ascending thoracic aortic aneurysm with a calcified right upper lobe granuloma and no suspicious pulmonary nodules as well as a she advanced three-vessel coronary artery calcification and aortic atherosclerosis.  He was last seen in the office in 06/2023 noting chronic stable dizziness and tinnitus, reporting underlying neurologic comorbidities in the setting of Lyme disease.  He was active.  He declined continuation of statin therapy or alternative lipid-lowering medications, self discontinuing bempedoic acid  with no longer target effect.  He was seeing a functional medicine specialist.  CTA chest/aorta in 08/2023 showed stable 4 cm ascending thoracic aortic aneurysm with no acute findings.  He comes in doing very well from a cardiac perspective and is without symptoms of angina or cardiac decompensation.  Chronic dizziness is stable.  No presyncope or syncope.  No palpitations or progressive dyspnea.  No lower extremity swelling or progressive orthopnea.  He remains active at baseline.  While he was out on his hunting trip in the [facility] back in the fall he was able to exert himself without cardiac limitation.  Blood pressure remains well-controlled at home.  He has a significantly improved his diet.  Prefers to avoid statin therapy due to concern for off target  effect.  Open to retrying bempedoic acid  (previously tolerated without off target effect).   Labs independently reviewed: 06/2023 - albumin 4.3, AST/ALT normal, TC 273, TG 110, HDL 41, LDL 210 09/2022 - Hgb 13.5, PLT 175, potassium 4.0, BUN 17, serum creatinine 1.1 08/2022 - TC 99, TG 119, HDL 29, LDL 46 05/2022 - A1c 5.8 07/2020 - TSH normal  Past Medical History:  Diagnosis Date   Anxiety    Arthritis    Carotid artery disease (HCC)    a.) Carotid doppler 11/14/2021: 1-39% RICA; 60-79% LICA; < 50% CCA and ECA. c.) CTA neck 12/04/2021: 70-80% stenosis of prox LICA; mild plaquing in prox RICA without stenosis.   Coronary artery disease 04/14/2016   a.) LHC 04/14/2016: 95% RI, 50% oOM2-OM2, 50% p-mLAD --> PCI performed placing a 2.25 x 15 mm Xience Alpine DES to RI. b.) LHC 12/23/2018: 95% OM1, 40% pLAD, 40% mRCA --> PCI performed placing a 2.25 x 15 mm Resolute Onxy DES to oOM1. c.) LHC 06/25/2020: EF 55-65%; LVEDP norm; 40% p-mRCA, 20% pRCA, 50% p-mLAD, 30% OM2-2; RI and OM2-1 stents patent; intervention deferred opting for medical mgmt.   Depression    Diastolic dysfunction 04/14/2016   a.) TTE 04/14/2016: EF 65%; mild LA dil, G1DD. b.) stress echo 06/26/2016: EF >55%; triv panvalvular regurg, max workload 13.70 METS. c.) TTE 11/23/2018: EF >%%; mild panvalvular regurg; fibrocalcified  AoV without stenosis; G1DD.   GERD (gastroesophageal reflux disease)    Hyperlipidemia    Hypertension    Long term current use of antithrombotics/antiplatelets    a.) on daily DAPT therapy (ASA + clopidogrel )   Lyme disease    NSTEMI (non-ST elevated myocardial infarction) (HCC) 04/13/2016   a.) LHC 04/14/2016 --> 95% RI, 50% oOM2-OM2, 50% p-mLAD --> PCI performed placing a 2.25 x 15 mm Xience Alpine DES to the RI   Tinnitus    Unstable angina [facility])     Past Surgical History:  Procedure Laterality Date   CARDIAC CATHETERIZATION Right 04/14/2016   Procedure: Left Heart Cath and Coronary Angiography;   Surgeon: Denyse DELENA Bathe, MD;  Location: [facility] INVASIVE CV LAB;  Service: Cardiovascular;  Laterality: Right;   CARDIAC CATHETERIZATION N/A 04/14/2016   Procedure: Coronary Stent Intervention;  Surgeon: Cara JONETTA Lovelace, MD;  Location: [facility] INVASIVE CV LAB;  Service: Cardiovascular;  Laterality: N/A;   COLONOSCOPY     COLONOSCOPY WITH PROPOFOL  N/A 03/26/2020   Procedure: COLONOSCOPY WITH PROPOFOL ;  Surgeon: Maryruth Ole DASEN, MD;  Location: [facility] ENDOSCOPY;  Service: Endoscopy;  Laterality: N/A;   CORONARY STENT INTERVENTION N/A 12/23/2018   Procedure: CORONARY STENT INTERVENTION;  Surgeon: Ammon Blunt, MD;  Location: [facility] INVASIVE CV LAB;  Service: Cardiovascular;  Laterality: N/A;   ENDARTERECTOMY Left 01/08/2022   Procedure: ENDARTERECTOMY CAROTID;  Surgeon: Jama Cordella MATSU, MD;  Location: [facility] ORS;  Service: Vascular;  Laterality: Left;   LEFT HEART CATH AND CORONARY ANGIOGRAPHY Left 12/23/2018   Procedure: LEFT HEART CATH AND CORONARY ANGIOGRAPHY;  Surgeon: Bathe Denyse DELENA, MD;  Location: [facility] INVASIVE CV LAB;  Service: Cardiovascular;  Laterality: Left;   LEFT HEART CATH AND CORONARY ANGIOGRAPHY Left 06/25/2020   Procedure: LEFT HEART CATH AND CORONARY ANGIOGRAPHY;  Surgeon: Darron Deatrice DELENA, MD;  Location: [facility] INVASIVE CV LAB;  Service: Cardiovascular;  Laterality: Left;   PORT-A-CATH REMOVAL     PORTACATH PLACEMENT      Current Medications: Current Meds  Medication Sig   Bempedoic Acid  (NEXLETOL ) 180 MG TABS Take 1 tablet (180 mg total) by mouth daily.   carvedilol  (COREG ) 25 MG tablet Take 1 tablet (25 mg total) by mouth 2 (two) times daily.   citalopram  (CELEXA ) 40 MG tablet Take 40 mg by mouth at bedtime.   clopidogrel  (PLAVIX ) 75 MG tablet Take 75 mg by mouth daily.   isosorbide  mononitrate (IMDUR ) 30 MG 24 hr tablet Take 30 mg by mouth daily.   nitroGLYCERIN  (NITROSTAT ) 0.4 MG SL tablet PLACE 1 TABLET UNDER THE TONGUE EVERY 5 MINUTES AS NEEDED.   pantoprazole   (PROTONIX ) 40 MG tablet Take 1 tablet (40 mg total) by mouth daily.   [DISCONTINUED] isosorbide  mononitrate (IMDUR ) 60 MG 24 hr tablet TAKE 1 TABLET BY MOUTH EVERY DAY    Allergies:   Patient has no known allergies.   Social History   Socioeconomic History   Marital status: Married    Spouse name: Not on file   Number of children: Not on file   Years of education: Not on file   Highest education level: Not on file  Occupational History   Not on file  Tobacco Use   Smoking status: Never   Smokeless tobacco: Never  Vaping Use   Vaping status: Never Used  Substance and Sexual Activity   Alcohol use: Yes    Comment: occ beer   Drug use: No   Sexual activity: Not on file  Other Topics Concern  Not on file  Social History Narrative   Lives at home with wife and daughter.   Social Drivers of Corporate Investment Banker Strain: Low Risk  (08/05/2022)   Received from [facility], [facility]   Overall Financial Resource Strain (CARDIA)    Difficulty of Paying Living Expenses: Not hard at all  Food Insecurity: Unknown (08/05/2022)   Received from [facility], [facility]   Hunger Vital Sign    Worried About Running Out of Food in the Last Year: Never true    Ran Out of Food in the Last Year: Not on file  Transportation Needs: Unknown (08/05/2022)   Received from [facility], [facility]   [facility] - Transportation    In the past 12 months, has lack of transportation kept you from medical appointments or from getting medications?: No    Lack of Transportation (Non-Medical): Not on file  Physical Activity: Not on file  Stress: Not on file  Social Connections: Not on file     Family History:  The patient's family history includes CAD in his mother; Cancer in his maternal aunt and mother.  ROS:   12-point review of systems is negative unless otherwise noted in the  HPI.   EKGs/Labs/Other Studies Reviewed:    Studies reviewed were summarized above. The additional studies were reviewed today:  CTA chest/aorta 08/08/2023: IMPRESSION: 1. Stable 4 cm ascending thoracic aortic aneurysm. Recommend follow-up CTA/MRA every 12 months and vascular consultation. 2. No acute findings. __________  CT chest without contrast on 07/16/2023: IMPRESSION: 1. Calcified right upper lobe granuloma. No suspicious pulmonary nodules. 2. Age advanced three-vessel coronary artery calcification. 3. 4.1 cm ascending aortic aneurysm, stable. Recommend annual imaging followup by CTA or MRA. This recommendation follows 2010 ACCF/AHA/AATS/ACR/ASA/SCA/SCAI/SIR/STS/SVM Guidelines for the Diagnosis and Management of Patients with Thoracic Aortic Disease. Circulation. 2010; 121: Z733-z630. Aortic aneurysm NOS (ICD10-I71.9). 4.  Aortic atherosclerosis __________  2D echo 01/19/2023: 1. Left ventricular ejection fraction, by estimation, is 60 to 65%. The  left ventricle has normal function. The left ventricle has no regional  wall motion abnormalities. There is moderate asymmetric left ventricular  hypertrophy of the basal-septal  segment. No significant LVOT gradient measured. Left ventricular diastolic  parameters are consistent with Grade I diastolic dysfunction (impaired  relaxation).   2. Right ventricular systolic function is normal. The right ventricular  size is normal.   3. The mitral valve is normal in structure. Mild mitral valve  regurgitation. No evidence of mitral stenosis.   4. The aortic valve is tricuspid. Aortic valve regurgitation is not  visualized. No aortic stenosis is present.   5. There is mild dilatation of the aortic root, measuring 41 mm. There is  mild dilatation of the ascending aorta, measuring 43 mm.   6. The inferior vena cava is normal in size with greater than 50%  respiratory variability, suggesting right atrial pressure of 3 mmHg.   __________   Carotid artery ultrasound 11/06/2022: Summary:  Right Carotid: Velocities in the right ICA are consistent with a 1-39% stenosis.   Left Carotid: Velocities in the left ICA are consistent with a 1-39% stenosis.   Vertebrals: Bilateral vertebral arteries demonstrate antegrade flow.  Subclavians: Normal flow hemodynamics were seen in bilateral subclavian arteries.  __________   Lexiscan  MPI 11/13/2021:   Findings are consistent with no ischemia. The study is low risk.   No ST deviation was noted.  LV perfusion is normal.   Left ventricular function is normal. LVEF is 56%.   Coronary artery calcification noted in the LAD, LCx __________   [facility] 06/25/2020: The left ventricular systolic function is normal. LV end diastolic pressure is normal. The left ventricular ejection fraction is 55-65% by visual estimate. Prox RCA to Mid RCA lesion is 40% stenosed. Prox RCA lesion is 20% stenosed. Prox LAD to Mid LAD lesion is 50% stenosed. Previously placed Ramus stent (unknown type) is widely patent. Previously placed 2nd Mrg-1 stent (unknown type) is widely patent. 2nd Mrg-2 lesion is 30% stenosed.   1.  Patent stents in OM 2 and ramus with no significant restenosis.  Stable moderate proximal LAD and mid RCA disease. 2.  Normal LV systolic function and normal left ventricular end-diastolic pressure.   Recommendations: Continue aggressive medical therapy. __________   Lexiscan  MPI 06/19/2020: Abnormal, potentially high-risk pharmacologic myocardial perfusion stress test. There is a large in size, moderate in severity, nearly completely reversible defect involving the anterior and anteroseptal walls that is concerning for ischemia though an element of artifact cannot be excluded. The left ventricular ejection fraction is normal (60%). Attenuation correction demonstrates coronary stents and mild aortic atherosclerosis. __________   LHC 12/23/2018: Prox LAD lesion is 40%  stenosed. Ost 1st Mrg lesion is 95% stenosed. Mid RCA lesion is 40% stenosed. A drug-eluting stent was successfully placed using a STENT RESOLUTE ONYX 2.25X15. Post intervention, there is a 0% residual stenosis.   1.  One-vessel CAD with 95% stenosis proximal OM1 2.  Successful PCI with DES proximal OM1 __________     See Epic for more remote cardiac studies   EKG:  EKG is ordered today.  The EKG ordered today demonstrates sinus bradycardia, 58 bpm, baseline wandering, no acute ST-T changes  Recent Labs: 09/16/2022: Hemoglobin 13.5; Platelets 175 07/01/2023: ALT 13  Recent Lipid Panel    Component Value Date/Time   CHOL 273 (H) 07/01/2023 0830   CHOL 122 06/04/2020 1549   TRIG 110 07/01/2023 0830   HDL 41 07/01/2023 0830   HDL 43 06/04/2020 1549   CHOLHDL 6.7 07/01/2023 0830   VLDL 22 07/01/2023 0830   LDLCALC 210 (H) 07/01/2023 0830   LDLCALC 60 06/04/2020 1549   LDLDIRECT 61 06/04/2020 1549    PHYSICAL EXAM:    VS:  BP 133/81 (BP Location: Left Arm, Patient Position: Sitting, Cuff Size: Normal)   Pulse (!) 58   Ht 5' 11 (1.803 m)   Wt 223 lb 3.2 oz (101.2 kg)   SpO2 98%   BMI 31.13 kg/m   BMI: Body mass index is 31.13 kg/m.  Physical Exam Vitals reviewed.  Constitutional:      Appearance: He is well-developed.  HENT:     Head: Normocephalic and atraumatic.  Eyes:     General:        Right eye: No discharge.        Left eye: No discharge.  Cardiovascular:     Rate and Rhythm: Normal rate and regular rhythm.     Heart sounds: Normal heart sounds, S1 normal and S2 normal. Heart sounds not distant. No midsystolic click and no opening snap. No murmur heard.    No friction rub.  Pulmonary:     Effort: Pulmonary effort is normal. No respiratory distress.     Breath sounds: Normal breath sounds. No decreased breath sounds, wheezing, rhonchi or rales.  Chest:     Chest wall: No tenderness.  Abdominal:  General: There is no distension.  Musculoskeletal:      Cervical back: Normal range of motion.  Skin:    General: Skin is warm and dry.     Nails: There is no clubbing.  Neurological:     Mental Status: He is alert and oriented to person, place, and time.  Psychiatric:        Speech: Speech normal.        Behavior: Behavior normal.        Thought Content: Thought content normal.        Judgment: Judgment normal.     Wt Readings from Last 3 Encounters:  09/15/23 223 lb 3.2 oz (101.2 kg)  06/30/23 226 lb 9.6 oz (102.8 kg)  02/26/23 239 lb 6.4 oz (108.6 kg)     ASSESSMENT & PLAN:   CAD involving the native coronary arteries without angina: He continues to do well and is without symptoms concerning for angina or cardiac decompensation.  Remains active at baseline without cardiac limitation.  Continue aggressive risk factor modification and secondary prevention including clopidogrel , carvedilol , and Imdur .  We will undergo a retry of bempedoic acid  as outlined below.  No indication for further ischemic testing at this time.  Ascending thoracic aortic aneurysm: Stable on recent CTA in 08/2023, measuring 4 cm.  Continue active lifestyle and optimal blood pressure control.  Repeat imaging in 12 months.  HTN: Blood pressure is reasonably controlled in the office with a repeat blood pressure of 128/84.  Blood pressure is well-controlled at home.  He remains on carvedilol  25 mg twice daily and Imdur  30 mg.  HLD: LDL 210 off statin/lipid-lowering therapy.  Previously well-controlled on statin therapy.  Prefers to avoid statin therapy due to concern for off target effect.  Open to undergoing a retry of Nexletol  180 mg daily (previously on and tolerated without off target effect).  Plan for follow-up fasting lipid panel and LP(a) prior to next visit.  Carotid artery disease: Status post left-sided CEA.  Most recent carotid artery ultrasound from 10/2022 demonstrated 1 to 39% bilateral ICA stenosis.  He remains on clopidogrel .  Restarting Nexletol  as  outlined above.  Anticipate follow-up imaging at next visit.  This was not discussed at today's visit.    Disposition: F/u with Dr. Darron or an APP in 2 months.   Medication Adjustments/Labs and Tests Ordered: Current medicines are reviewed at length with the patient today.  Concerns regarding medicines are outlined above. Medication changes, Labs and Tests ordered today are summarized above and listed in the Patient Instructions accessible in Encounters.   Signed, Bernardino Bring, PA-C 09/15/2023 5:30 PM     [facility] [place] [address] [place], [place] [address] [phone]

## 2023-09-15 ENCOUNTER — Ambulatory Visit: Payer: BC Managed Care – PPO | Attending: Physician Assistant | Admitting: Physician Assistant

## 2023-09-15 ENCOUNTER — Encounter: Payer: Self-pay | Admitting: Physician Assistant

## 2023-09-15 VITALS — BP 133/81 | HR 58 | Ht 71.0 in | Wt 223.2 lb

## 2023-09-15 DIAGNOSIS — I2510 Atherosclerotic heart disease of native coronary artery without angina pectoris: Secondary | ICD-10-CM | POA: Diagnosis not present

## 2023-09-15 DIAGNOSIS — I6522 Occlusion and stenosis of left carotid artery: Secondary | ICD-10-CM

## 2023-09-15 DIAGNOSIS — E785 Hyperlipidemia, unspecified: Secondary | ICD-10-CM

## 2023-09-15 DIAGNOSIS — I7121 Aneurysm of the ascending aorta, without rupture: Secondary | ICD-10-CM

## 2023-09-15 DIAGNOSIS — I10 Essential (primary) hypertension: Secondary | ICD-10-CM

## 2023-09-15 DIAGNOSIS — Z79899 Other long term (current) drug therapy: Secondary | ICD-10-CM

## 2023-09-15 MED ORDER — NEXLETOL 180 MG PO TABS: 180.0000 mg | ORAL_TABLET | Freq: Every day | ORAL | 3 refills | Status: DC

## 2023-09-15 NOTE — Patient Instructions (Addendum)
 Medication Instructions:  Your physician recommends the following medication changes.  START TAKING: Nexletol  180 mg daily  *If you need a refill on your cardiac medications before your next appointment, please call your pharmacy*   Lab Work: Your provider would like for you to return in 2 months to have the following labs drawn: LPa and Lipid panel.   Please go to [facility] [address] [address], [place] [address] You do not need an appointment.  They are open from 8 am- 5 pm.  Lunch from 1:00 pm- 2:00 pm You DO  need to be fasting.   If you have labs (blood work) drawn today and your tests are completely normal, you will receive your results only by: MyChart Message (if you have MyChart) OR A paper copy in the mail If you have any lab test that is abnormal or we need to change your treatment, we will call you to review the results.   Follow-Up: At [facility], you and your health needs are our priority.  As part of our continuing mission to provide you with exceptional heart care, we have created designated Provider Care Teams.  These Care Teams include your primary Cardiologist (physician) and Advanced Practice Providers (APPs -  Physician Assistants and Nurse Practitioners) who all work together to provide you with the care you need, when you need it.  We recommend signing up for the patient portal called MyChart.  Sign up information is provided on this After Visit Summary.  MyChart is used to connect with patients for Virtual Visits (Telemedicine).  Patients are able to view lab/test results, encounter notes, upcoming appointments, etc.  Non-urgent messages can be sent to your provider as well.   To learn more about what you can do with MyChart, go to [email or web address].    Your next appointment:   2 month(s)  Provider:   You may see Deatrice Cage, MD or one of the following Advanced Practice Providers on your  designated Care Team:   Bernardino Bring, PA-C

## 2023-09-28 ENCOUNTER — Other Ambulatory Visit (HOSPITAL_COMMUNITY): Payer: Self-pay

## 2023-09-28 ENCOUNTER — Telehealth: Payer: Self-pay

## 2023-09-28 NOTE — Telephone Encounter (Signed)
Pharmacy Patient Advocate Encounter   Received notification from CoverMyMeds that prior authorization for NEXLETOL is required/requested.   Insurance verification completed.   The patient is insured through [facility] .   Per test claim: PA required; PA submitted to above mentioned insurance via CoverMyMeds Key/confirmation #/[id number] [facility] Status is pending

## 2023-09-29 NOTE — Telephone Encounter (Signed)
Pharmacy Patient Advocate Encounter  Received notification from [name] that Prior Authorization for NEXLETOL has been DENIED.  Full denial letter will be uploaded to the media tab. See denial reason below.  PLAN REQUIRES 12 WEEK TRIAL OF ZETIA FOR APPROVAL

## 2023-09-30 ENCOUNTER — Other Ambulatory Visit (HOSPITAL_COMMUNITY): Payer: Self-pay

## 2023-09-30 MED ORDER — EZETIMIBE 10 MG PO TABS
10.0000 mg | ORAL_TABLET | Freq: Every day | ORAL | 0 refills | Status: AC
  Filled 2023-09-30: qty 90, 90d supply, fill #0

## 2023-09-30 NOTE — Addendum Note (Signed)
Addended by: Parke Poisson on: 09/30/2023 03:21 PM   Modules accepted: Orders

## 2023-09-30 NOTE — Telephone Encounter (Signed)
Please inform the patient his insurance plan requires a 12-week trial of ezetimibe 10 mg daily before potential approval of Nexletol.  If he is agreeable, start ezetimibe 10 mg daily and let us know how he is feeling on the medication.

## 2023-09-30 NOTE — Telephone Encounter (Signed)
Pt made aware and verbalized understanding.  Rx sent to requested pharmacy

## 2023-10-13 ENCOUNTER — Other Ambulatory Visit (HOSPITAL_COMMUNITY): Payer: Self-pay

## 2023-10-20 ENCOUNTER — Telehealth: Payer: Self-pay | Admitting: Physician Assistant

## 2023-10-20 NOTE — Telephone Encounter (Signed)
Pt called in asking to speak with nurse about medications, he states he has been denied twice. Please advise.

## 2023-10-22 NOTE — Telephone Encounter (Signed)
Patient just wanted to let Eula Listen PA-C know that his insurance did not approve Nexletol twice. So he was calling to let us know. Advised that I would send this to him. He verbalized understanding

## 2023-10-23 ENCOUNTER — Other Ambulatory Visit (HOSPITAL_COMMUNITY): Payer: Self-pay

## 2023-10-23 ENCOUNTER — Telehealth: Payer: Self-pay | Admitting: Pharmacy Technician

## 2023-10-23 MED ORDER — REPATHA SURECLICK 140 MG/ML ~~LOC~~ SOAJ
140.0000 mg | SUBCUTANEOUS | 3 refills | Status: AC
  Filled 2023-10-23: qty 6, 84d supply, fill #0

## 2023-10-23 NOTE — Telephone Encounter (Signed)
Did his insurance company provide recommendations for alternatives that they may cover?  Patient has documented statin intolerance.

## 2023-10-23 NOTE — Telephone Encounter (Signed)
Pharmacy Patient Advocate Encounter   Received notification from Pt Calls Messages that prior authorization for Repatha is required/requested.   Insurance verification completed.   The patient is insured through  [employer]  .   Per test claim: The current 10/23/23 day co-pay is, $45.00- one month.  No PA needed at this time. This test claim was processed through [facility]- copay amounts may vary at other pharmacies due to pharmacy/plan contracts, or as the patient moves through the different stages of their insurance plan.

## 2023-10-23 NOTE — Telephone Encounter (Signed)
The patient has been notified of the medication prescription attempts.  Pt verbalized understanding and reports that he will research "Repatha" over the weekend.  Pt told the Repatha will be sent to his pharmacy to be filled if he decides to pursue this course.    Pt reports more concerned for lowering his calcium  Pt reminded of upcoming appt for labs on 11/13/23 and encouraged to call if any concerns or questions arise  [facility] message sent

## 2023-10-23 NOTE — Telephone Encounter (Signed)
Please inform the patient that Nexletol was denied by his insurance.  He is intolerant to high intensity statins.  Insurance will cover Repatha (injectable), though he has not wanted to pursue these medications.  I do recommend we pursue PCSK9 inhibitor in an effort to reduce his CV risk.  Let me know what he decides.

## 2023-11-04 ENCOUNTER — Other Ambulatory Visit (HOSPITAL_COMMUNITY): Payer: Self-pay

## 2023-11-13 ENCOUNTER — Other Ambulatory Visit: Payer: Self-pay | Admitting: *Deleted

## 2023-11-13 ENCOUNTER — Telehealth: Payer: Self-pay | Admitting: *Deleted

## 2023-11-13 DIAGNOSIS — E785 Hyperlipidemia, unspecified: Secondary | ICD-10-CM | POA: Diagnosis not present

## 2023-11-13 DIAGNOSIS — I2510 Atherosclerotic heart disease of native coronary artery without angina pectoris: Secondary | ICD-10-CM

## 2023-11-13 DIAGNOSIS — Z79899 Other long term (current) drug therapy: Secondary | ICD-10-CM | POA: Diagnosis not present

## 2023-11-13 NOTE — Telephone Encounter (Signed)
 Patient here in office today to have labs done prior to his upcoming appointment. Upon arriving to have them done he requested that we add on NMR Lipo Profile. Reached out to provider via secure chat (see below). Discussed with patient recommendations and potential cost for that additional test. He verbalized understanding of all information discussed and requested to have it done regardless of cost. Orders placed and labs collected.

## 2023-11-14 LAB — NMR, LIPOPROFILE
Cholesterol, Total: 223 mg/dL — ABNORMAL HIGH (ref 100–199)
HDL Particle Number: 24 umol/L — ABNORMAL LOW (ref >=30.5)
HDL-C: 43 mg/dL (ref >39)
LDL Particle Number: 1853 nmol/L — ABNORMAL HIGH (ref <1000)
LDL Size: 20.6 nm (ref >20.5)
LDL-C (NIH Calc): 163 mg/dL — ABNORMAL HIGH (ref 0–99)
LP-IR Score: 53 — ABNORMAL HIGH (ref <=45)
Small LDL Particle Number: 1037 nmol/L — ABNORMAL HIGH (ref <=527)
Triglycerides: 95 mg/dL (ref 0–149)

## 2023-11-15 LAB — LIPID PANEL
Chol/HDL Ratio: 5.5 ratio — ABNORMAL HIGH (ref 0.0–5.0)
Cholesterol, Total: 227 mg/dL — ABNORMAL HIGH (ref 100–199)
HDL: 41 mg/dL (ref >39)
LDL Chol Calc (NIH): 169 mg/dL — ABNORMAL HIGH (ref 0–99)
Triglycerides: 95 mg/dL (ref 0–149)
VLDL Cholesterol Cal: 17 mg/dL (ref 5–40)

## 2023-11-15 LAB — LIPOPROTEIN A (LPA): Lipoprotein (a): 75.8 nmol/L — ABNORMAL HIGH (ref <75.0)

## 2023-11-15 NOTE — Progress Notes (Unsigned)
 Cardiology Office Note    Date:  11/18/2023   ID:  Carlos Diaz, DOB 25-Aug-1961, MRN [id number]  PCP:  Dione Housekeeper, MD  Cardiologist:  Lorine Bears, MD  Electrophysiologist:  None   Chief Complaint: Follow-up  History of Present Illness:   Carlos Diaz is a 63 y.o. male with history of CAD with multiple PCI's as outlined below, ascending thoracic aortic aneurysm measuring 4.0 cm by CTA in 08/2023, HTN, HLD, carotid artery disease status post left sided carotid endarterectomy for asymptomatic high-grade stenosis in 12/2021, anxiety, and depression who presents for follow-up of CAD and ascending thoracic aortic aneurysm.   He was admitted in 2017 with a NSTEMI.  LHC showed high-grade stenosis in the ramus intermedius which was successfully treated with PCI/DES.  He had recurrent angina in 12/2018 with repeat LHC showing a patent ramus stent.  There was high-grade stenosis in a proximal OM branch which was treated with PCI/DES.  He was also noted to have moderate proximal to mid LAD stenosis along with mild RCA disease.  He was seen in 06/2020 with atypical chest pain with subsequent Lexiscan MPI showing evidence of anterior and anteroseptal ischemia with normal EF.  Based on that, he underwent repeat LHC in 06/2020 which demonstrated patent stents in the ramus and OM2 with no significant restenosis.  There was stable moderate proximal LAD and mid RCA disease.  He had a normal LV systolic function and LVEDP.  Medical therapy was recommended.  He was seen in the ED in 09/2021 with chest and abdominal pain as well as elevated BP (felt to be related to wrong BP cuff size).  High-sensitivity troponin negative x2.  CT of the abdomen/pelvis was unrevealing.  He was treated with GI cocktail.  He subsequently underwent Lexiscan MPI in 10/2021 which showed no evidence of ischemia and was overall low risk with a normal LVEF.  Coronary artery calcification was noted in the LAD and LCx.  He had a  screening carotid exam which reportedly demonstrated left-sided "critical stenosis."  Subsequent carotid artery ultrasound on 11/14/2021 demonstrated 1 to 39% RICA stenosis with less than 50% RECA stenosis, 60 to 79% LICA stenosis with less than 50% LECA stenosis, bilateral antegrade flow within the vertebral arteries, and normal flow hemodynamics in the bilateral subclavian arteries.  He was subsequently evaluated by vascular surgery on 11/21/2021 with CTA of the neck on 12/04/2021 demonstrated proximal LICA stenosis of 70 to 80% with mild plaquing in the proximal RICA without hemodynamically significant stenosis with subsequent left-sided CEA in 12/2021.  Incidentally, there was a cluster of nodules in the right upper lobe measuring up to 6 mm that were possibly infectious versus inflammatory with recommendation of follow-up chest CT in 3 months.  Follow-up CT chest in 03/2022 showed a 6 mm anterior right upper lobe pulmonary nodule that was previously seen and stable with cluster of right upper lobe nodularity felt to be sequelae from infectious/inflammatory etiology.  Mediastinal lymphadenopathy was noted with recommendation for follow-up chest CT in 3 months.  A 4.1 ascending thoracic aortic aneurysm was also noted.       Carotid artery ultrasound in 10/2022 showed 1 to 39% bilateral ICA stenosis.   MRI of the brain on 01/06/2023 showed no acute changes with mild chronic marrow diffusion signal hyperintensity in the skull base and apical cervical spine that was nonspecific and unchanged from 2021 favoring a benign/indolent etiology.   To further evaluate his ascending thoracic aortic aneurysm, he underwent echo on  01/19/2023 which showed an EF of 60 to 65%, no regional wall motion abnormalities, moderate asymmetric LVH with no significant LVOT gradient, grade 1 diastolic dysfunction, normal RV systolic function and ventricular cavity size, mild mitral regurgitation, tricuspid aortic valve without evidence of  insufficiency or regurgitation, mild dilatation of the aortic root measuring 41 mm, mild dilatation of the ascending aorta measuring 43 mm, and a normal CVP.   He was seen in the office on 02/26/2023 and in the setting of elevated BP readings in the office in the 160s over 90s, he was transitioned from metoprolol to carvedilol 25 mg twice daily with continuation of Imdur.  He also reported some concerns regarding ongoing statin use with recommendation to undergo a trial of bempedoic acid.   He underwent follow-up chest CT on 06/23/2023, for evaluation of previously noted mediastinal lymphadenopathy on study in 03/2022, which showed stable 4.1 cm ascending thoracic aortic aneurysm with a calcified right upper lobe granuloma and no suspicious pulmonary nodules as well as a she advanced three-vessel coronary artery calcification and aortic atherosclerosis.   He was seen in the office in 06/2023 noting chronic stable dizziness and tinnitus, reporting underlying neurologic comorbidities in the setting of Lyme disease.  He was active.  He declined continuation of statin therapy or alternative lipid-lowering medications, self discontinuing bempedoic acid with no longer target effect.  He was seeing a functional medicine specialist.   CTA chest/aorta in 08/2023 showed stable 4 cm ascending thoracic aortic aneurysm with no acute findings.  He was last seen in the office on 09/15/2023 and remained without symptoms of angina or cardiac decompensation.  While on his hunting trip in the fall he was able to exert himself without cardiac limitation.  Blood pressure was well-controlled at home.  He reported significant improvement in his diet.  He continued to prefer to avoid statin therapy due to concern for off target effect.  He was open to retrying bempedoic acid.  However, insurance denied approval of medication.  We again revisited PCSK9 inhibitor, which she agreed to and is now taking Repatha.  Most recent lipid panel  obtained earlier this month was off of lipid-lowering therapy.  He comes in accompanied by his wife today and is doing reasonably well from a cardiac perspective.  He does note a 2-week history of left lateral randomly occurring chest discomfort described as a burn and will last for approximately 15 minutes.  Discomfort is not associated with exertion, he is able to workout on the treadmill and lift weights without limitation.  Discomfort does not feel similar to his prior angina, that was mostly dyspnea.  He has not yet started Repatha, indicates [facility] did not contact him and that prior auth was approved.  Remains hesitant to initiate any form of lipid-lowering therapy.  He does continue to work on a heart healthy diet.   Labs independently reviewed: 10/2023 - TC 227, TG 95, HDL 41, LDL 169, LP(a) 75.8 06/2023 - albumin 4.3, AST/ALT normal, TC 273, TG 110, HDL 41, LDL 210 09/2022 - Hgb 13.5, PLT 175, potassium 4.0, BUN 17, serum creatinine 1.1 08/2022 - TC 99, TG 119, HDL 29, LDL 46 05/2022 - A1c 5.8 07/2020 - TSH normal  Past Medical History:  Diagnosis Date   Anxiety    Arthritis    Carotid artery disease (HCC)    a.) Carotid doppler 11/14/2021: 1-39% RICA; 60-79% LICA; < 50% CCA and ECA. c.) CTA neck 12/04/2021: 70-80% stenosis of prox LICA; mild plaquing in  prox RICA without stenosis.   Coronary artery disease 04/14/2016   a.) LHC 04/14/2016: 95% RI, 50% oOM2-OM2, 50% p-mLAD --> PCI performed placing a 2.25 x 15 mm Xience Alpine DES to RI. b.) LHC 12/23/2018: 95% OM1, 40% pLAD, 40% mRCA --> PCI performed placing a 2.25 x 15 mm Resolute Onxy DES to oOM1. c.) LHC 06/25/2020: EF 55-65%; LVEDP norm; 40% p-mRCA, 20% pRCA, 50% p-mLAD, 30% OM2-2; RI and OM2-1 stents patent; intervention deferred opting for medical mgmt.   Depression    Diastolic dysfunction 04/14/2016   a.) TTE 04/14/2016: EF 65%; mild LA dil, G1DD. b.) stress echo 06/26/2016: EF >55%; triv panvalvular regurg, max workload 13.70  METS. c.) TTE 11/23/2018: EF >%%; mild panvalvular regurg; fibrocalcified AoV without stenosis; G1DD.   Hyperlipidemia    Hypertension    Long term current use of antithrombotics/antiplatelets    a.) on daily DAPT therapy (ASA + clopidogrel)   Lyme disease    NSTEMI (non-ST elevated myocardial infarction) (HCC) 04/13/2016   a.) LHC 04/14/2016 --> 95% RI, 50% oOM2-OM2, 50% p-mLAD --> PCI performed placing a 2.25 x 15 mm Xience Alpine DES to the RI   Tinnitus    Unstable angina [facility])     Past Surgical History:  Procedure Laterality Date   CARDIAC CATHETERIZATION Right 04/14/2016   Procedure: Left Heart Cath and Coronary Angiography;  Surgeon: Laurier Nancy, MD;  Location: [facility] INVASIVE CV LAB;  Service: Cardiovascular;  Laterality: Right;   CARDIAC CATHETERIZATION N/A 04/14/2016   Procedure: Coronary Stent Intervention;  Surgeon: Alwyn Pea, MD;  Location: [facility] INVASIVE CV LAB;  Service: Cardiovascular;  Laterality: N/A;   COLONOSCOPY     COLONOSCOPY WITH PROPOFOL N/A 03/26/2020   Procedure: COLONOSCOPY WITH PROPOFOL;  Surgeon: Regis Bill, MD;  Location: [facility] ENDOSCOPY;  Service: Endoscopy;  Laterality: N/A;   CORONARY STENT INTERVENTION N/A 12/23/2018   Procedure: CORONARY STENT INTERVENTION;  Surgeon: Marcina Millard, MD;  Location: [facility] INVASIVE CV LAB;  Service: Cardiovascular;  Laterality: N/A;   ENDARTERECTOMY Left 01/08/2022   Procedure: ENDARTERECTOMY CAROTID;  Surgeon: Renford Dills, MD;  Location: [facility] ORS;  Service: Vascular;  Laterality: Left;   LEFT HEART CATH AND CORONARY ANGIOGRAPHY Left 12/23/2018   Procedure: LEFT HEART CATH AND CORONARY ANGIOGRAPHY;  Surgeon: Laurier Nancy, MD;  Location: [facility] INVASIVE CV LAB;  Service: Cardiovascular;  Laterality: Left;   LEFT HEART CATH AND CORONARY ANGIOGRAPHY Left 06/25/2020   Procedure: LEFT HEART CATH AND CORONARY ANGIOGRAPHY;  Surgeon: Iran Ouch, MD;  Location: [facility] INVASIVE CV LAB;  Service:  Cardiovascular;  Laterality: Left;   PORT-A-CATH REMOVAL     PORTACATH PLACEMENT      Current Medications: Current Meds  Medication Sig   carvedilol (COREG) 25 MG tablet Take 1 tablet (25 mg total) by mouth 2 (two) times daily.   citalopram (CELEXA) 40 MG tablet Take 40 mg by mouth at bedtime.   clopidogrel (PLAVIX) 75 MG tablet Take 75 mg by mouth daily.   isosorbide mononitrate (IMDUR) 30 MG 24 hr tablet Take 30 mg by mouth daily.   nitroGLYCERIN (NITROSTAT) 0.4 MG SL tablet PLACE 1 TABLET UNDER THE TONGUE EVERY 5 MINUTES AS NEEDED.   pantoprazole (PROTONIX) 40 MG tablet Take 1 tablet (40 mg total) by mouth daily.    Allergies:   Patient has no known allergies.   Social History   Socioeconomic History   Marital status: Married    Spouse name: Not on file  Number of children: Not on file   Years of education: Not on file   Highest education level: Not on file  Occupational History   Not on file  Tobacco Use   Smoking status: Never   Smokeless tobacco: Never  Vaping Use   Vaping status: Never Used  Substance and Sexual Activity   Alcohol use: Yes    Comment: occ beer   Drug use: No   Sexual activity: Not on file  Other Topics Concern   Not on file  Social History Narrative   Lives at home with wife and daughter.   Social Drivers of Corporate investment banker Strain: Low Risk  (08/05/2022)   Received from [facility], [facility]   Overall Financial Resource Strain (CARDIA)    Difficulty of Paying Living Expenses: Not hard at all  Food Insecurity: Unknown (08/05/2022)   Received from [facility], [facility]   Hunger Vital Sign    Worried About Running Out of Food in the Last Year: Never true    Ran Out of Food in the Last Year: Not on file  Transportation Needs: Unknown (08/05/2022)   Received from [facility], [facility]   [facility] - Transportation    In  the past 12 months, has lack of transportation kept you from medical appointments or from getting medications?: No    Lack of Transportation (Non-Medical): Not on file  Physical Activity: Not on file  Stress: Not on file  Social Connections: Not on file     Family History:  The patient's family history includes CAD in his mother; Cancer in his maternal aunt and mother.  ROS:   12-point review of systems is negative unless otherwise noted in the HPI.   EKGs/Labs/Other Studies Reviewed:    Studies reviewed were summarized above. The additional studies were reviewed today:  CTA chest/aorta 08/08/2023: IMPRESSION: 1. Stable 4 cm ascending thoracic aortic aneurysm. Recommend follow-up CTA/MRA every 12 months and vascular consultation. 2. No acute findings. __________   CT chest without contrast on 07/16/2023: IMPRESSION: 1. Calcified right upper lobe granuloma. No suspicious pulmonary nodules. 2. Age advanced three-vessel coronary artery calcification. 3. 4.1 cm ascending aortic aneurysm, stable. Recommend annual imaging followup by CTA or MRA. This recommendation follows 2010 ACCF/AHA/AATS/ACR/ASA/SCA/SCAI/SIR/STS/SVM Guidelines for the Diagnosis and Management of Patients with Thoracic Aortic Disease. Circulation. 2010; 121: N829-F621. Aortic aneurysm NOS (ICD10-I71.9). 4.  Aortic atherosclerosis __________   2D echo 01/19/2023: 1. Left ventricular ejection fraction, by estimation, is 60 to 65%. The  left ventricle has normal function. The left ventricle has no regional  wall motion abnormalities. There is moderate asymmetric left ventricular  hypertrophy of the basal-septal  segment. No significant LVOT gradient measured. Left ventricular diastolic  parameters are consistent with Grade I diastolic dysfunction (impaired  relaxation).   2. Right ventricular systolic function is normal. The right ventricular  size is normal.   3. The mitral valve is normal in structure. Mild  mitral valve  regurgitation. No evidence of mitral stenosis.   4. The aortic valve is tricuspid. Aortic valve regurgitation is not  visualized. No aortic stenosis is present.   5. There is mild dilatation of the aortic root, measuring 41 mm. There is  mild dilatation of the ascending aorta, measuring 43 mm.   6. The inferior vena cava is normal in size with greater than 50%  respiratory variability, suggesting right atrial pressure of 3  mmHg.  __________   Carotid artery ultrasound 11/06/2022: Summary:  Right Carotid: Velocities in the right ICA are consistent with a 1-39% stenosis.   Left Carotid: Velocities in the left ICA are consistent with a 1-39% stenosis.   Vertebrals: Bilateral vertebral arteries demonstrate antegrade flow.  Subclavians: Normal flow hemodynamics were seen in bilateral subclavian arteries.  __________   Eugenie Birks MPI 11/13/2021:   Findings are consistent with no ischemia. The study is low risk.   No ST deviation was noted.   LV perfusion is normal.   Left ventricular function is normal. LVEF is 56%.   Coronary artery calcification noted in the LAD, LCx __________   [facility] 06/25/2020: The left ventricular systolic function is normal. LV end diastolic pressure is normal. The left ventricular ejection fraction is 55-65% by visual estimate. Prox RCA to Mid RCA lesion is 40% stenosed. Prox RCA lesion is 20% stenosed. Prox LAD to Mid LAD lesion is 50% stenosed. Previously placed Ramus stent (unknown type) is widely patent. Previously placed 2nd Mrg-1 stent (unknown type) is widely patent. 2nd Mrg-2 lesion is 30% stenosed.   1.  Patent stents in OM 2 and ramus with no significant restenosis.  Stable moderate proximal LAD and mid RCA disease. 2.  Normal LV systolic function and normal left ventricular end-diastolic pressure.   Recommendations: Continue aggressive medical therapy. __________   Eugenie Birks MPI 06/19/2020: Abnormal, potentially high-risk  pharmacologic myocardial perfusion stress test. There is a large in size, moderate in severity, nearly completely reversible defect involving the anterior and anteroseptal walls that is concerning for ischemia though an element of artifact cannot be excluded. The left ventricular ejection fraction is normal (60%). Attenuation correction demonstrates coronary stents and mild aortic atherosclerosis. __________   LHC 12/23/2018: Prox LAD lesion is 40% stenosed. Ost 1st Mrg lesion is 95% stenosed. Mid RCA lesion is 40% stenosed. A drug-eluting stent was successfully placed using a STENT RESOLUTE ONYX 2.25X15. Post intervention, there is a 0% residual stenosis.   1.  One-vessel CAD with 95% stenosis proximal OM1 2.  Successful PCI with DES proximal OM1 __________     See Epic for more remote cardiac studies   EKG:  EKG is ordered today.  The EKG ordered today demonstrates sinus bradycardia, 59 bpm, no acute st/t changes, consistent with prior tracings   Recent Labs: 07/01/2023: ALT 13  Recent Lipid Panel    Component Value Date/Time   CHOL 227 (H) 11/13/2023 0920   TRIG 95 11/13/2023 0920   HDL 41 11/13/2023 0920   CHOLHDL 5.5 (H) 11/13/2023 0920   CHOLHDL 6.7 07/01/2023 0830   VLDL 22 07/01/2023 0830   LDLCALC 169 (H) 11/13/2023 0920   LDLDIRECT 61 06/04/2020 1549    PHYSICAL EXAM:    VS:  BP 128/82 (BP Location: Left Arm, Patient Position: Sitting, Cuff Size: Normal)   Pulse (!) 59   Ht 5\' 11"  (1.803 m)   Wt 226 lb 6.4 oz (102.7 kg)   SpO2 96%   BMI 31.58 kg/m   BMI: Body mass index is 31.58 kg/m.  Physical Exam Vitals reviewed.  Constitutional:      Appearance: He is well-developed.  HENT:     Head: Normocephalic and atraumatic.  Eyes:     General:        Right eye: No discharge.        Left eye: No discharge.  Cardiovascular:     Rate and Rhythm: Normal rate and regular rhythm.     Heart  sounds: Normal heart sounds, S1 normal and S2 normal. Heart sounds not  distant. No midsystolic click and no opening snap. No murmur heard.    No friction rub.  Pulmonary:     Effort: Pulmonary effort is normal. No respiratory distress.     Breath sounds: Normal breath sounds. No decreased breath sounds, wheezing, rhonchi or rales.  Chest:     Chest wall: No tenderness.  Musculoskeletal:     Cervical back: Normal range of motion.  Skin:    General: Skin is warm and dry.     Nails: There is no clubbing.  Neurological:     Mental Status: He is alert and oriented to person, place, and time.  Psychiatric:        Speech: Speech normal.        Behavior: Behavior normal.        Thought Content: Thought content normal.        Judgment: Judgment normal.     Wt Readings from Last 3 Encounters:  11/18/23 226 lb 6.4 oz (102.7 kg)  09/15/23 223 lb 3.2 oz (101.2 kg)  06/30/23 226 lb 9.6 oz (102.8 kg)     ASSESSMENT & PLAN:   CAD involving the native coronary arteries without angina: He reports an approximate 2-week history of left-sided chest discomfort described as a burning that is randomly occurring.  Symptoms do not feel similar to prior angina.  It is somewhat reproducible to his palpation.  Schedule myocardial PET/CT to evaluate for high risk ischemia, particularly in the setting of uncontrolled hyperlipidemia.  Continue aggressive risk factor modification and secondary prevention with clopidogrel, carvedilol, and Imdur.  Continue to revisit lipid-lowering therapy at each visit.  Ascending thoracic aortic aneurysm: Stable on CTA in 08/2023 measuring 4 cm.  Continue active lifestyle and optimal blood pressure control.  Repeat imaging in 08/2024.  HTN: Blood pressure is well-controlled in the office today.  He remains on carvedilol 25 mg twice daily and Imdur 30 mg.  HLD: LDL 169 in 10/2023.  LP(a) 75.8.  NMR profile with elevated LDL particle number.  Previously well-controlled on statin therapy.  Prefers to avoid statin therapy due to concern for off target  effect.  Insurance would not approve Nexletol without ezetimibe trial.  Declines ezetimibe.  Has been approved for Repatha, has not yet picked up the medication.  He was provided medication information regarding PCSK9 inhibitors for review.  Revisit in follow-up.  Risks of uncontrolled hyperlipidemia discussed in detail.  Carotid artery disease: Status post left-sided CEA. Most recent carotid artery ultrasound from 10/2022 demonstrated 1 to 39% bilateral ICA stenosis.  Remains on clopidogrel as outlined above.  Continue to visit lipid-lowering strategies at each visit.  Anticipate follow-up imaging at next visit.  This was not discussed in detail at today's visit given need to discuss lipid-lowering strategies.   Informed Consent   Shared Decision Making/Informed Consent{  The risks [chest pain, shortness of breath, cardiac arrhythmias, dizziness, blood pressure fluctuations, myocardial infarction, stroke/transient ischemic attack, nausea, vomiting, allergic reaction, radiation exposure, metallic taste sensation and life-threatening complications (estimated to be 1 in 10,000)], benefits (risk stratification, diagnosing coronary artery disease, treatment guidance) and alternatives of a cardiac PET stress test were discussed in detail with Mr. Risinger and he agrees to proceed.        Disposition: F/u with Dr. Kirke Corin or an APP after myocardial PET/CT.   Medication Adjustments/Labs and Tests Ordered: Current medicines are reviewed at length with the patient today.  Concerns  regarding medicines are outlined above. Medication changes, Labs and Tests ordered today are summarized above and listed in the Patient Instructions accessible in Encounters.   Signed, Eula Listen, PA-C 11/18/2023 5:13 PM     [facility] [place] [address] [place], [place] [address] [phone]

## 2023-11-18 ENCOUNTER — Ambulatory Visit: Payer: BC Managed Care – PPO | Attending: Physician Assistant | Admitting: Physician Assistant

## 2023-11-18 ENCOUNTER — Encounter: Payer: Self-pay | Admitting: Physician Assistant

## 2023-11-18 VITALS — BP 128/82 | HR 59 | Ht 71.0 in | Wt 226.4 lb

## 2023-11-18 DIAGNOSIS — R079 Chest pain, unspecified: Secondary | ICD-10-CM

## 2023-11-18 DIAGNOSIS — I7121 Aneurysm of the ascending aorta, without rupture: Secondary | ICD-10-CM

## 2023-11-18 DIAGNOSIS — E785 Hyperlipidemia, unspecified: Secondary | ICD-10-CM

## 2023-11-18 DIAGNOSIS — I6522 Occlusion and stenosis of left carotid artery: Secondary | ICD-10-CM

## 2023-11-18 DIAGNOSIS — I25118 Atherosclerotic heart disease of native coronary artery with other forms of angina pectoris: Secondary | ICD-10-CM | POA: Diagnosis not present

## 2023-11-18 DIAGNOSIS — R072 Precordial pain: Secondary | ICD-10-CM | POA: Diagnosis not present

## 2023-11-18 DIAGNOSIS — I10 Essential (primary) hypertension: Secondary | ICD-10-CM | POA: Diagnosis not present

## 2023-11-18 NOTE — Patient Instructions (Signed)
 Medication Instructions:  Your Physician recommend you continue on your current medication as directed.    *If you need a refill on your cardiac medications before your next appointment, please call your pharmacy*   Lab Work: None ordered at this time    Testing/Procedures:  Please report to Radiology at [facility] Main Entrance, medical mall, 30 mins prior to your test.  [address]  [place], [place]  How to Prepare for Your Cardiac PET/CT Stress Test:  Nothing to eat or drink, except water, 3 hours prior to arrival time.  NO caffeine/decaffeinated products, or chocolate 12 hours prior to arrival. (Please note decaffeinated beverages (teas/coffees) still contain caffeine).  If you have caffeine within 12 hours prior, the test will need to be rescheduled.  Medication instructions: Do not take erectile dysfunction medications for 72 hours prior to test (sildenafil, tadalafil) Do not take nitrates (isosorbide mononitrate, Ranexa) the day before or day of test Do not take tamsulosin the day before or morning of test Hold theophylline containing medications for 12 hours. Hold Dipyridamole 48 hours prior to the test.  You may take your remaining medications with water.  NO perfume, cologne or lotion on chest or abdomen area.   Total time is 1 to 2 hours; you may want to bring reading material for the waiting time.   In preparation for your appointment, medication and supplies will be purchased.  Appointment availability is limited, so if you need to cancel or reschedule, please call the Radiology Department Scheduler at [phone] 24 hours in advance to avoid a cancellation fee of $100.00  What to Expect When you Arrive:  Once you arrive and check in for your appointment, you will be taken to a preparation room within the Radiology Department.  A technologist or Nurse will obtain your medical history, verify that you are correctly prepped for the  exam, and explain the procedure.  Afterwards, an IV will be started in your arm and electrodes will be placed on your skin for EKG monitoring during the stress portion of the exam. Then you will be escorted to the PET/CT scanner.  There, staff will get you positioned on the scanner and obtain a blood pressure and EKG.  During the exam, you will continue to be connected to the EKG and blood pressure machines.  A small, safe amount of a radioactive tracer will be injected in your IV to obtain a series of pictures of your heart along with an injection of a stress agent.    After your Exam:  It is recommended that you eat a meal and drink a caffeinated beverage to counter act any effects of the stress agent.  Drink plenty of fluids for the remainder of the day and urinate frequently for the first couple of hours after the exam.  Your doctor will inform you of your test results within 7-10 business days.  For more information and frequently asked questions, please visit our website: [email or web address]  For questions about your test or how to prepare for your test, please call: Cardiac Imaging Nurse Navigators Office: [phone]    Follow-Up: At [facility], you and your health needs are our priority.  As part of our continuing mission to provide you with exceptional heart care, we have created designated Provider Care Teams.  These Care Teams include your primary Cardiologist (physician) and Advanced Practice Providers (APPs -  Physician Assistants and Nurse Practitioners) who all work together to provide you with the care  you need, when you need it.  We recommend signing up for the patient portal called "MyChart".  Sign up information is provided on this After Visit Summary.  MyChart is used to connect with patients for Virtual Visits (Telemedicine).  Patients are able to view lab/test results, encounter notes, upcoming appointments, etc.  Non-urgent messages can be sent to  your provider as well.   To learn more about what you can do with MyChart, go to [email or web address].    Your next appointment:   After stress test - please call us after it's scheduled for your follow-up  Provider:   You may see Lorine Bears, MD or Eula Listen, PA-C  Other Instructions  [email or web address]  Abstract  BACKGROUND: The proprotein convertase subtilisin/kexin type 9 (PCSK9) inhibitors are a new class of cholesterol-lowering medications that provide significant reductions in lipids but at a large cost relative to statins. With 2 such drugs now on the market, alirocumab and evolocumab (REPATHA), comparing the evidence base for these drugs is necessary for informed decision making.  OBJECTIVE: To compare the benefits and harms of the PCSK9 inhibitors alirocumab and evolocumab.  METHODS: The databases Ovid MEDLINE, Cochrane Library, [address], and [address].gov were used to search for randomized controlled trials of alirocumab or evolocumab with any relevant comparator reporting health outcomes, lipid outcomes, or harms through September 2015, and information was requested from manufacturers. Results were reviewed according to standard review methods.  RESULTS: The database searches revealed 17 fair- and good-quality trials; however, none had primary health outcomes or directly compared PCSK9 inhibitors. Alirocumab (75 mg to 150 mg subcutaneously every 2 weeks) resulted in significantly greater reductions in low-density lipoprotein cholesterol (LDL-C; -8% to -67%) at 12-24 weeks in patients with (a) heterozygous familial hypercholesterolemia and (b) patients at high or varied cardiovascular (CV) risk who were not at LDL-C goals with statin therapy. The highest strength evidence was for patients with high CV risk not at LDL-C goals. Alirocumab also resulted in high-density lipoprotein cholesterol (HDL-C) increases of 6%-12%. Low- and  moderate-strength evidence for adjudicated CV events at 52-78 weeks for a priori analyses indicated no benefit. Low- and moderate-strength evidence also found no differences in harms except possibly slightly more injection-site reactions. Evolocumab (120 mg subcutaneously every 2 weeks to 420 mg every 4 weeks) resulted in significantly greater reductions in LDL-C (-32% to -71%) at 12-52 weeks in patients with heterozygous or homozygous familial hypercholesterolemia, patients intolerant of statins, and patients with varied CV risk not at LDL-C goal with statin therapy. The highest strength evidence was for heterozygous familial hypercholesterolemia and patients not at LDL-C goals. Moderate-strength evidence showed HDL-C increases in the range of 4.5%-6.8%. Harms were not different between groups, except possibly slightly greater overall adverse event reporting. Evidence on adjudicated CV outcomes was insufficient to draw conclusions because of sparseness of events, study limitations, and inability to assess consistency of findings.  CONCLUSIONS: Alirocumab and evolocumab have evidence of large improvements in lipid levels. The strength of the evidence is greater for alirocumab than evolocumab in patients with high CV risk who were not at LDL-C target goals, while evidence for evolocumab is stronger in patients with heterogeneous familial hypercholesterolemia and patients with varied CV risk who were not at LDL-C target goals. Evidence on adjudicated CV outcomes for a priori analyses is unable to show benefit for alirocumab and is insufficient to draw conclusions for evolocumab. Important questions remain about the comparative effects on long-term health outcomes.

## 2023-12-09 ENCOUNTER — Other Ambulatory Visit (INDEPENDENT_AMBULATORY_CARE_PROVIDER_SITE_OTHER): Payer: Self-pay | Admitting: Vascular Surgery

## 2023-12-09 DIAGNOSIS — I6523 Occlusion and stenosis of bilateral carotid arteries: Secondary | ICD-10-CM

## 2023-12-14 ENCOUNTER — Encounter (INDEPENDENT_AMBULATORY_CARE_PROVIDER_SITE_OTHER): Payer: Self-pay | Admitting: Nurse Practitioner

## 2023-12-14 ENCOUNTER — Ambulatory Visit (INDEPENDENT_AMBULATORY_CARE_PROVIDER_SITE_OTHER): Payer: BC Managed Care – PPO | Admitting: Nurse Practitioner

## 2023-12-14 ENCOUNTER — Ambulatory Visit (INDEPENDENT_AMBULATORY_CARE_PROVIDER_SITE_OTHER): Payer: BC Managed Care – PPO

## 2023-12-14 VITALS — BP 111/71 | HR 58 | Resp 16 | Wt 234.2 lb

## 2023-12-14 DIAGNOSIS — I6523 Occlusion and stenosis of bilateral carotid arteries: Secondary | ICD-10-CM

## 2023-12-14 DIAGNOSIS — I10 Essential (primary) hypertension: Secondary | ICD-10-CM

## 2023-12-14 DIAGNOSIS — E782 Mixed hyperlipidemia: Secondary | ICD-10-CM

## 2023-12-14 DIAGNOSIS — I6522 Occlusion and stenosis of left carotid artery: Secondary | ICD-10-CM | POA: Diagnosis not present

## 2023-12-14 NOTE — Progress Notes (Signed)
 MRN : [id number]  Carlos Diaz is a 63 y.o. ([birth year]) male who presents with chief complaint of check circulation.  History of Present Illness:   The patient is seen for follow up evaluation of carotid stenosis status post left carotid endarterectomy on 01/08/2022.  There were no post operative problems or complications related to the surgery.  The patient denies neck or incisional pain.   The patient denies interval amaurosis fugax. There is no recent history of TIA symptoms or focal motor deficits. There is no prior documented CVA.   The patient denies headache.   The patient is taking enteric-coated aspirin 81 mg daily.   No recent shortening of the patient's walking distance or new symptoms consistent with claudication.  No history of rest pain symptoms. No new ulcers or wounds of the lower extremities have occurred.   There is no history of DVT, PE or superficial thrombophlebitis. No recent episodes of angina or shortness of breath documented.     Duplex ultrasound of the bilateral carotid arteries shows 1-39% bilateral carotid artery stenosis.  No significant change compared to previous study.   Current Meds  Medication Sig   carvedilol (COREG) 25 MG tablet Take 1 tablet (25 mg total) by mouth 2 (two) times daily.   citalopram (CELEXA) 40 MG tablet Take 40 mg by mouth at bedtime.   clopidogrel (PLAVIX) 75 MG tablet Take 75 mg by mouth daily.   isosorbide mononitrate (IMDUR) 30 MG 24 hr tablet Take 30 mg by mouth daily.   nitroGLYCERIN (NITROSTAT) 0.4 MG SL tablet PLACE 1 TABLET UNDER THE TONGUE EVERY 5 MINUTES AS NEEDED.   pantoprazole (PROTONIX) 40 MG tablet Take 1 tablet (40 mg total) by mouth daily.    Past Medical History:  Diagnosis Date   Anxiety    Arthritis    Carotid artery disease (HCC)    a.) Carotid doppler 11/14/2021: 1-39% RICA; 60-79% LICA; < 50% CCA and ECA. c.) CTA neck 12/04/2021: 70-80% stenosis of prox LICA; mild plaquing in prox  RICA without stenosis.   Coronary artery disease 04/14/2016   a.) LHC 04/14/2016: 95% RI, 50% oOM2-OM2, 50% p-mLAD --> PCI performed placing a 2.25 x 15 mm Xience Alpine DES to RI. b.) LHC 12/23/2018: 95% OM1, 40% pLAD, 40% mRCA --> PCI performed placing a 2.25 x 15 mm Resolute Onxy DES to oOM1. c.) LHC 06/25/2020: EF 55-65%; LVEDP norm; 40% p-mRCA, 20% pRCA, 50% p-mLAD, 30% OM2-2; RI and OM2-1 stents patent; intervention deferred opting for medical mgmt.   Depression    Diastolic dysfunction 04/14/2016   a.) TTE 04/14/2016: EF 65%; mild LA dil, G1DD. b.) stress echo 06/26/2016: EF >55%; triv panvalvular regurg, max workload 13.70 METS. c.) TTE 11/23/2018: EF >%%; mild panvalvular regurg; fibrocalcified AoV without stenosis; G1DD.   Hyperlipidemia    Hypertension    Long term current use of antithrombotics/antiplatelets    a.) on daily DAPT therapy (ASA + clopidogrel)   Lyme disease    NSTEMI (non-ST elevated myocardial infarction) (HCC) 04/13/2016   a.) LHC 04/14/2016 --> 95% RI, 50% oOM2-OM2, 50% p-mLAD --> PCI performed placing a 2.25 x 15 mm Xience Alpine DES to the RI   Tinnitus    Unstable angina [facility])     Past Surgical History:  Procedure Laterality Date   CARDIAC CATHETERIZATION Right 04/14/2016   Procedure: Left Heart Cath and Coronary Angiography;  Surgeon: Laurier Nancy, MD;  Location: [facility] INVASIVE CV LAB;  Service: Cardiovascular;  Laterality: Right;   CARDIAC CATHETERIZATION N/A 04/14/2016   Procedure: Coronary Stent Intervention;  Surgeon: Alwyn Pea, MD;  Location: [facility] INVASIVE CV LAB;  Service: Cardiovascular;  Laterality: N/A;   COLONOSCOPY     COLONOSCOPY WITH PROPOFOL N/A 03/26/2020   Procedure: COLONOSCOPY WITH PROPOFOL;  Surgeon: Regis Bill, MD;  Location: [facility] ENDOSCOPY;  Service: Endoscopy;  Laterality: N/A;   CORONARY STENT INTERVENTION N/A 12/23/2018   Procedure: CORONARY STENT INTERVENTION;  Surgeon: Marcina Millard, MD;  Location: [facility]  INVASIVE CV LAB;  Service: Cardiovascular;  Laterality: N/A;   ENDARTERECTOMY Left 01/08/2022   Procedure: ENDARTERECTOMY CAROTID;  Surgeon: Renford Dills, MD;  Location: [facility] ORS;  Service: Vascular;  Laterality: Left;   LEFT HEART CATH AND CORONARY ANGIOGRAPHY Left 12/23/2018   Procedure: LEFT HEART CATH AND CORONARY ANGIOGRAPHY;  Surgeon: Laurier Nancy, MD;  Location: [facility] INVASIVE CV LAB;  Service: Cardiovascular;  Laterality: Left;   LEFT HEART CATH AND CORONARY ANGIOGRAPHY Left 06/25/2020   Procedure: LEFT HEART CATH AND CORONARY ANGIOGRAPHY;  Surgeon: Iran Ouch, MD;  Location: [facility] INVASIVE CV LAB;  Service: Cardiovascular;  Laterality: Left;   PORT-A-CATH REMOVAL     PORTACATH PLACEMENT      Social History Social History   Tobacco Use   Smoking status: Never   Smokeless tobacco: Never  Vaping Use   Vaping status: Never Used  Substance Use Topics   Alcohol use: Yes    Comment: occ beer   Drug use: No    Family History Family History  Problem Relation Age of Onset   Cancer Mother        lung cancer with brain mets   CAD Mother    Cancer Maternal Aunt        brain cancer    No Known Allergies   REVIEW OF SYSTEMS (Negative unless checked)  Constitutional: [] Weight loss  [] Fever  [] Chills Cardiac: [] Chest pain   [] Chest pressure   [] Palpitations   [] Shortness of breath when laying flat   [] Shortness of breath with exertion. Vascular:  [] Pain in legs with walking   [] Pain in legs at rest  [] History of DVT   [] Phlebitis   [] Swelling in legs   [] Varicose veins   [] Non-healing ulcers Pulmonary:   [] Uses home oxygen   [] Productive cough   [] Hemoptysis   [] Wheeze  [] COPD   [] Asthma Neurologic:  [] Dizziness   [] Seizures   [] History of stroke   [] History of TIA  [] Aphasia   [] Vissual changes   [] Weakness or numbness in arm   [] Weakness or numbness in leg Musculoskeletal:   [] Joint swelling   [] Joint pain   [] Low back pain Hematologic:  [] Easy bruising  [] Easy  bleeding   [] Hypercoagulable state   [] Anemic Gastrointestinal:  [] Diarrhea   [] Vomiting  [] Gastroesophageal reflux/heartburn   [] Difficulty swallowing. Genitourinary:  [] Chronic kidney disease   [] Difficult urination  [] Frequent urination   [] Blood in urine Skin:  [] Rashes   [] Ulcers  Psychological:  [x] History of anxiety   []  History of major depression.  Physical Examination  Vitals:   12/14/23 1518  BP: 111/71  Pulse: (!) 58  Resp: 16  Weight: 234 lb 3.2 oz (106.2 kg)   Body mass index is 32.66 kg/m. Gen: WD/WN, NAD Head: [place]/AT, No temporalis wasting.  Ear/Nose/Throat: Hearing grossly intact, nares w/o erythema or drainage Eyes: PER, EOMI, sclera nonicteric.  Neck: Supple, no masses.  No bruit or JVD.  Pulmonary:  Good air movement, no audible wheezing, no use of accessory muscles.  Cardiac: RRR, normal S1, S2, no Murmurs. Vascular:  Vessel Right Left  Radial Palpable Palpable  Carotid Palpable Palpable  Gastrointestinal: soft, non-distended. No guarding/no peritoneal signs.  Musculoskeletal: M/S 5/5 throughout.  No visible deformity.  Neurologic: CN 2-12 intact. Pain and light touch intact in extremities.  Symmetrical.  Speech is fluent. Motor exam as listed above. Psychiatric: Judgment intact, Mood & affect appropriate for pt's clinical situation. Dermatologic: No rashes or ulcers noted.  No changes consistent with cellulitis.   CBC Lab Results  Component Value Date   WBC 6.5 09/16/2022   HGB 13.5 09/16/2022   HCT 39.9 09/16/2022   MCV 87.9 09/16/2022   PLT 175 09/16/2022    BMET    Component Value Date/Time   NA 139 01/09/2022 0430   NA 141 07/19/2020 1209   K 3.9 01/09/2022 0430   CL 109 01/09/2022 0430   CO2 26 01/09/2022 0430   GLUCOSE 113 (H) 01/09/2022 0430   BUN 11 01/09/2022 0430   BUN 15 07/19/2020 1209   CREATININE 0.97 01/09/2022 0430   CALCIUM 8.1 (L) 01/09/2022 0430   GFRNONAA >60 01/09/2022 0430   GFRAA 89 07/19/2020 1209   CrCl cannot  be calculated (Patient's most recent lab result is older than the maximum 21 days allowed.).  COAG Lab Results  Component Value Date   INR 1.06 04/14/2016    Radiology No results found.   Assessment/Plan 1. Stenosis of left carotid artery Recommend:  Given the patient's asymptomatic subcritical stenosis no further invasive testing or surgery at this time.  Duplex ultrasound shows 1-39% stenosis bilaterally.  He is status post successful carotid endarterectomy on the left.  Continue antiplatelet therapy as prescribed Continue management of CAD, HTN and Hyperlipidemia Healthy heart diet,  encouraged exercise at least 4 times per week Follow up in 12 months with duplex ultrasound and physical exam    2. Primary hypertension Continue antihypertensive medications as already ordered, these medications have been reviewed and there are no changes at this time.   3. Mixed hyperlipidemia Continue statin as ordered and reviewed, no changes at this time    Valene Gash, NP  12/14/2023 11:44 PM

## 2023-12-18 ENCOUNTER — Other Ambulatory Visit: Payer: Self-pay | Admitting: Physician Assistant

## 2023-12-18 ENCOUNTER — Other Ambulatory Visit: Payer: Self-pay | Admitting: Cardiovascular Disease

## 2023-12-18 DIAGNOSIS — I2510 Atherosclerotic heart disease of native coronary artery without angina pectoris: Secondary | ICD-10-CM

## 2023-12-29 ENCOUNTER — Ambulatory Visit: Payer: BC Managed Care – PPO | Admitting: Physician Assistant

## 2023-12-30 ENCOUNTER — Telehealth (HOSPITAL_COMMUNITY): Payer: Self-pay | Admitting: *Deleted

## 2023-12-30 ENCOUNTER — Encounter (HOSPITAL_COMMUNITY): Payer: Self-pay

## 2023-12-30 NOTE — Telephone Encounter (Signed)
 Reaching out to patient to offer assistance regarding upcoming cardiac imaging study; pt verbalizes understanding of appt date/time, parking situation and where to check in, pre-test NPO status and medications ordered, and verified current allergies; name and call back number provided for further questions should they arise Johney Frame RN Navigator Cardiac Imaging [facility] [phone] office [phone] cell  Patient aware to avoid caffeine for 12 hours prior to test and hold imdur.

## 2023-12-31 ENCOUNTER — Ambulatory Visit
Admission: RE | Admit: 2023-12-31 | Discharge: 2023-12-31 | Disposition: A | Discharge status: Home / Self Care | Source: Ambulatory Visit | Attending: Physician Assistant | Admitting: Physician Assistant

## 2023-12-31 DIAGNOSIS — I700 Atherosclerosis of aorta: Secondary | ICD-10-CM | POA: Diagnosis not present

## 2023-12-31 DIAGNOSIS — R079 Chest pain, unspecified: Secondary | ICD-10-CM

## 2023-12-31 LAB — NM PET CT CARDIAC PERFUSION MULTI W/ABSOLUTE BLOODFLOW
LV dias vol: 149 mL (ref 62–150)
LV sys vol: 61 mL
MBFR: 2.68
Nuc Rest EF: 54 %
Nuc Stress EF: 59 %
Peak HR: 75 {beats}/min
Rest HR: 57 {beats}/min
Rest MBF: 0.66 ml/g/min
Rest Nuclear Isotope Dose: 25 mCi
SRS: 0
SSS: 0
ST Depression (mm): 0 mm
Stress MBF: 1.77 ml/g/min
Stress Nuclear Isotope Dose: 24.9 mCi
TID: 0.98

## 2023-12-31 MED ORDER — REGADENOSON 0.4 MG/5ML IV SOLN
0.4000 mg | Freq: Once | INTRAVENOUS | Status: AC
  Administered 2023-12-31: 0.4 mg via INTRAVENOUS
  Filled 2023-12-31: qty 5

## 2023-12-31 MED ORDER — RUBIDIUM RB82 GENERATOR (RUBYFILL)
25.0000 | PACK | Freq: Once | INTRAVENOUS | Status: AC
  Administered 2023-12-31: 24.95 via INTRAVENOUS

## 2023-12-31 MED ORDER — RUBIDIUM RB82 GENERATOR (RUBYFILL)
25.0000 | PACK | Freq: Once | INTRAVENOUS | Status: AC
  Administered 2023-12-31: 24.93 via INTRAVENOUS

## 2023-12-31 MED ORDER — REGADENOSON 0.4 MG/5ML IV SOLN
INTRAVENOUS | Status: AC
  Filled 2023-12-31: qty 5

## 2023-12-31 NOTE — Progress Notes (Signed)
 Patient presents for a cardiac PET stress test and tolerated procedure without incident. Patient maintained acceptable vital signs throughout the test and was offered caffeine after test.  Patient ambulated out of department with a steady gait.

## 2024-01-19 ENCOUNTER — Encounter (INDEPENDENT_AMBULATORY_CARE_PROVIDER_SITE_OTHER): Payer: Self-pay

## 2024-01-28 ENCOUNTER — Telehealth: Payer: Self-pay | Admitting: Cardiovascular Disease

## 2024-01-28 MED ORDER — CLOPIDOGREL BISULFATE 75 MG PO TABS: 75.0000 mg | ORAL_TABLET | Freq: Every day | ORAL | 3 refills | Status: AC

## 2024-01-28 NOTE — Telephone Encounter (Signed)
*  STAT* If patient is at the pharmacy, call can be transferred to refill team.   1. Which medications need to be refilled? (please list name of each medication and dose if known)  clopidogrel  (PLAVIX ) 75 MG tablet  2. Which pharmacy/location (including street and city if local pharmacy) is medication to be sent to? [id number] [place], [place] [phone]  3. Do they need a 30 day or 90 day supply?  90 day supply

## 2024-01-28 NOTE — Telephone Encounter (Signed)
 Pt's medication was sent to pt's pharmacy as requested. Confirmation received.

## 2024-04-14 DIAGNOSIS — D044 Carcinoma in situ of skin of scalp and neck: Secondary | ICD-10-CM | POA: Diagnosis not present

## 2024-04-14 DIAGNOSIS — D485 Neoplasm of uncertain behavior of skin: Secondary | ICD-10-CM | POA: Diagnosis not present

## 2024-04-14 DIAGNOSIS — L570 Actinic keratosis: Secondary | ICD-10-CM | POA: Diagnosis not present

## 2024-05-09 DIAGNOSIS — C4442 Squamous cell carcinoma of skin of scalp and neck: Secondary | ICD-10-CM | POA: Diagnosis not present

## 2024-05-09 DIAGNOSIS — L570 Actinic keratosis: Secondary | ICD-10-CM | POA: Diagnosis not present

## 2024-06-03 ENCOUNTER — Other Ambulatory Visit: Payer: Self-pay | Admitting: Cardiovascular Disease

## 2024-07-22 DIAGNOSIS — A6922 Other neurologic disorders in Lyme disease: Secondary | ICD-10-CM | POA: Diagnosis not present

## 2024-07-22 DIAGNOSIS — Z0189 Encounter for other specified special examinations: Secondary | ICD-10-CM | POA: Diagnosis not present

## 2024-07-22 DIAGNOSIS — A6920 Lyme disease, unspecified: Secondary | ICD-10-CM | POA: Diagnosis not present

## 2024-12-13 ENCOUNTER — Ambulatory Visit (INDEPENDENT_AMBULATORY_CARE_PROVIDER_SITE_OTHER): Admitting: Nurse Practitioner

## 2024-12-13 ENCOUNTER — Encounter (INDEPENDENT_AMBULATORY_CARE_PROVIDER_SITE_OTHER)
